# Patient Record
Sex: Female | Born: 1945
Health system: Southern US, Community
[De-identification: ages and names within clinical notes are randomized; demographics above are authoritative.]

## PROBLEM LIST (undated history)

## (undated) DIAGNOSIS — I471 Supraventricular tachycardia, unspecified: Secondary | ICD-10-CM

## (undated) DIAGNOSIS — I872 Venous insufficiency (chronic) (peripheral): Secondary | ICD-10-CM

## (undated) DIAGNOSIS — I890 Lymphedema, not elsewhere classified: Secondary | ICD-10-CM

## (undated) DIAGNOSIS — Z9889 Other specified postprocedural states: Secondary | ICD-10-CM

## (undated) DIAGNOSIS — K5733 Diverticulitis of large intestine without perforation or abscess with bleeding: Secondary | ICD-10-CM

## (undated) DIAGNOSIS — K219 Gastro-esophageal reflux disease without esophagitis: Secondary | ICD-10-CM

## (undated) DIAGNOSIS — Z8489 Family history of other specified conditions: Secondary | ICD-10-CM

## (undated) DIAGNOSIS — K529 Noninfective gastroenteritis and colitis, unspecified: Secondary | ICD-10-CM

## (undated) DIAGNOSIS — D649 Anemia, unspecified: Secondary | ICD-10-CM

## (undated) DIAGNOSIS — M1990 Unspecified osteoarthritis, unspecified site: Secondary | ICD-10-CM

## (undated) DIAGNOSIS — R011 Cardiac murmur, unspecified: Secondary | ICD-10-CM

## (undated) DIAGNOSIS — T8859XA Other complications of anesthesia, initial encounter: Secondary | ICD-10-CM

## (undated) DIAGNOSIS — I2510 Atherosclerotic heart disease of native coronary artery without angina pectoris: Secondary | ICD-10-CM

## (undated) DIAGNOSIS — R0789 Other chest pain: Secondary | ICD-10-CM

## (undated) DIAGNOSIS — R55 Syncope and collapse: Secondary | ICD-10-CM

## (undated) DIAGNOSIS — M419 Scoliosis, unspecified: Secondary | ICD-10-CM

## (undated) DIAGNOSIS — Z87898 Personal history of other specified conditions: Secondary | ICD-10-CM

## (undated) DIAGNOSIS — G43909 Migraine, unspecified, not intractable, without status migrainosus: Secondary | ICD-10-CM

## (undated) DIAGNOSIS — R001 Bradycardia, unspecified: Secondary | ICD-10-CM

## (undated) DIAGNOSIS — B029 Zoster without complications: Secondary | ICD-10-CM

## (undated) DIAGNOSIS — I700 Atherosclerosis of aorta: Secondary | ICD-10-CM

## (undated) DIAGNOSIS — R112 Nausea with vomiting, unspecified: Secondary | ICD-10-CM

## (undated) DIAGNOSIS — T4145XA Adverse effect of unspecified anesthetic, initial encounter: Secondary | ICD-10-CM

## (undated) DIAGNOSIS — N83209 Unspecified ovarian cyst, unspecified side: Secondary | ICD-10-CM

## (undated) HISTORY — DX: Cardiac murmur, unspecified: R01.1

## (undated) HISTORY — DX: Diverticulitis of large intestine without perforation or abscess with bleeding: K57.33

## (undated) HISTORY — DX: Migraine, unspecified, not intractable, without status migrainosus: G43.909

## (undated) HISTORY — PX: COLONOSCOPY: SHX174

## (undated) HISTORY — PX: TONSILLECTOMY: SUR1361

## (undated) HISTORY — DX: Scoliosis, unspecified: M41.9

## (undated) HISTORY — DX: Syncope and collapse: R55

## (undated) HISTORY — DX: Personal history of other specified conditions: Z87.898

## (undated) HISTORY — DX: Zoster without complications: B02.9

## (undated) HISTORY — DX: Other chest pain: R07.89

## (undated) HISTORY — PX: ESOPHAGOGASTRODUODENOSCOPY: SHX1529

---
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
## 1989-09-30 HISTORY — PX: ABDOMINAL HYSTERECTOMY: SHX81

## 2004-08-29 ENCOUNTER — Ambulatory Visit: Payer: Self-pay | Admitting: Obstetrics and Gynecology

## 2004-11-30 ENCOUNTER — Encounter: Payer: Self-pay | Admitting: Obstetrics and Gynecology

## 2004-12-29 ENCOUNTER — Encounter: Payer: Self-pay | Admitting: Obstetrics and Gynecology

## 2005-01-28 ENCOUNTER — Encounter: Payer: Self-pay | Admitting: Obstetrics and Gynecology

## 2005-02-28 ENCOUNTER — Encounter: Payer: Self-pay | Admitting: Obstetrics and Gynecology

## 2005-03-30 ENCOUNTER — Encounter: Payer: Self-pay | Admitting: Obstetrics and Gynecology

## 2007-02-16 ENCOUNTER — Ambulatory Visit: Payer: Self-pay | Admitting: Internal Medicine

## 2007-04-24 ENCOUNTER — Ambulatory Visit: Payer: Self-pay

## 2008-04-30 DIAGNOSIS — K5733 Diverticulitis of large intestine without perforation or abscess with bleeding: Secondary | ICD-10-CM

## 2008-04-30 HISTORY — DX: Diverticulitis of large intestine without perforation or abscess with bleeding: K57.33

## 2008-05-11 ENCOUNTER — Inpatient Hospital Stay: Payer: Self-pay | Admitting: Unknown Physician Specialty

## 2008-05-23 ENCOUNTER — Ambulatory Visit: Payer: Self-pay | Admitting: Unknown Physician Specialty

## 2008-06-21 ENCOUNTER — Ambulatory Visit: Payer: Self-pay | Admitting: Obstetrics and Gynecology

## 2008-07-13 ENCOUNTER — Ambulatory Visit: Payer: Self-pay

## 2008-09-30 DIAGNOSIS — R0789 Other chest pain: Secondary | ICD-10-CM

## 2008-09-30 HISTORY — DX: Other chest pain: R07.89

## 2008-12-05 ENCOUNTER — Ambulatory Visit: Payer: Self-pay | Admitting: Unknown Physician Specialty

## 2009-08-03 ENCOUNTER — Ambulatory Visit: Payer: Self-pay

## 2009-12-05 ENCOUNTER — Other Ambulatory Visit: Payer: Self-pay | Admitting: Obstetrics and Gynecology

## 2010-04-17 ENCOUNTER — Ambulatory Visit: Payer: Self-pay | Admitting: Internal Medicine

## 2010-08-21 ENCOUNTER — Ambulatory Visit: Payer: Self-pay

## 2010-12-19 ENCOUNTER — Ambulatory Visit: Payer: Self-pay | Admitting: Obstetrics and Gynecology

## 2010-12-24 ENCOUNTER — Ambulatory Visit: Payer: Self-pay | Admitting: Obstetrics and Gynecology

## 2011-08-29 ENCOUNTER — Ambulatory Visit: Payer: Self-pay | Admitting: Obstetrics and Gynecology

## 2011-08-29 LAB — HM MAMMOGRAPHY: HM Mammogram: NORMAL

## 2011-11-27 ENCOUNTER — Ambulatory Visit: Payer: Self-pay | Admitting: Internal Medicine

## 2011-12-04 ENCOUNTER — Ambulatory Visit: Payer: Self-pay | Admitting: Internal Medicine

## 2011-12-25 ENCOUNTER — Ambulatory Visit (INDEPENDENT_AMBULATORY_CARE_PROVIDER_SITE_OTHER): Payer: PRIVATE HEALTH INSURANCE | Admitting: Internal Medicine

## 2011-12-25 ENCOUNTER — Encounter: Payer: Self-pay | Admitting: Internal Medicine

## 2011-12-25 VITALS — BP 108/60 | HR 82 | Temp 97.9°F | Resp 14 | Ht 62.0 in | Wt 125.5 lb

## 2011-12-25 DIAGNOSIS — K5733 Diverticulitis of large intestine without perforation or abscess with bleeding: Secondary | ICD-10-CM

## 2011-12-25 DIAGNOSIS — R0789 Other chest pain: Secondary | ICD-10-CM | POA: Insufficient documentation

## 2011-12-25 DIAGNOSIS — N83209 Unspecified ovarian cyst, unspecified side: Secondary | ICD-10-CM

## 2011-12-25 DIAGNOSIS — Z87898 Personal history of other specified conditions: Secondary | ICD-10-CM

## 2011-12-25 DIAGNOSIS — G4700 Insomnia, unspecified: Secondary | ICD-10-CM | POA: Insufficient documentation

## 2011-12-25 DIAGNOSIS — M419 Scoliosis, unspecified: Secondary | ICD-10-CM

## 2011-12-25 DIAGNOSIS — M4120 Other idiopathic scoliosis, site unspecified: Secondary | ICD-10-CM

## 2011-12-25 MED ORDER — ZOLPIDEM TARTRATE 10 MG PO TABS: 10.0000 mg | ORAL_TABLET | Freq: Every evening | ORAL | Status: DC | PRN

## 2011-12-25 NOTE — Progress Notes (Signed)
Patient ID: Jean Brooks, female   DOB: 10/12/1945, 65 y.o.   MRN: [id number]  Patient Active Problem List  Diagnoses  . Chest pain, non-cardiac  . Insomnia, persistent  . Diverticulitis of colon with hemorrhage  . Migraine headache  . Scoliosis of thoracic spine  . Cyst, ovarian  . H/O syncope    Subjective:  CC:   Chief Complaint  Patient presents with  . New Patient    HPI:   Jean Brooks a 66 y.o. female who presents to establish primary care.  She is tansferring from [place].  Her last office visit with him was many years ago.  She has had regular GYN  Follow up with Sharon Seller, last seen Jan 2013, for stable left ovarian cyst and PAP smear. . Mammogram normal Nov 2012.  Last colonoscopy by Dr. Mechele Collin was in 2010.  Has occasional episodes of sharp LLQ pain  Without diarrhea or fevers, so difficult to say whether it is the diverticular disease or the ovarian cysts causing the symptoms. She haa a comprehensive, noninvasive cardiac workup two years ago for atypical chest pain and a presyncopal episode which revealed PAF on 24 Hr Holter Monitor but symptoms are infrequent and she has not had to use Toprol prn.    Past Medical History  Diagnosis Date  . Chest pain, non-cardiac 2010    normal stress  test   . Diverticulitis of colon with hemorrhage August 2009    Hospitalized [facility]  . Migraine headache   . Scoliosis of thoracic spine   . H/O syncope     Past Surgical History  Procedure Date  . Appendectomy 1963  . Abdominal hysterectomy 1991         The following portions of the patient's history were reviewed and updated as appropriate: Allergies, current medications, and problem list.    Review of Systems:   12 Pt  review of systems was negative except those addressed in the HPI,     History   Social History  . Marital Status: Married    Spouse Name: N/A    Number of Children: N/A  . Years of Education: N/A   Occupational  History  . Not on file.   Social History Main Topics  . Smoking status: Never Smoker   . Smokeless tobacco: Never Used  . Alcohol Use: No  . Drug Use: No  . Sexually Active: Not on file   Other Topics Concern  . Not on file   Social History Narrative  . No narrative on file    Objective:  BP 108/60  Pulse 82  Temp(Src) 97.9 F (36.6 C) (Oral)  Resp 14  Ht 5\' 2"  (1.575 m)  Wt 125 lb 8 oz (56.926 kg)  BMI 22.95 kg/m2  SpO2 98%  General appearance: alert, cooperative and appears stated age Ears: normal TM's and external ear canals both ears Throat: lips, mucosa, and tongue normal; teeth and gums normal Neck: no adenopathy, no carotid bruit, supple, symmetrical, trachea midline and thyroid not enlarged, symmetric, no tenderness/mass/nodules Back: symmetric, no curvature. ROM normal. No CVA tenderness. Lungs: clear to auscultation bilaterally Heart: regular rate and rhythm, S1, S2 normal, no murmur, click, rub or gallop Abdomen: soft, non-tender; bowel sounds normal; no masses,  no organomegaly Pulses: 2+ and symmetric Skin: Skin color, texture, turgor normal. No rashes or lesions Lymph nodes: Cervical, supraclavicular, and axillary nodes normal.  Assessment and Plan:  Cyst, ovarian Incidental finding on 2009 CT done during episode  of diverticulitis.  Managed with serial ultrasounds and CA 125 by Dr. Inge Rise have been stable.  last imaging and marker done March 2012  H/O syncope April 2010, after treatment of UTI with cipro.  Echocardiogram noting normal LV function mild mitral and tricuspid insufficiency. 24-hour Holter monitor showed rare PACs and PVCs with short runs of SVT consistent with atrial fibrillation this stress echo was normal. She was considered a low [place] score and was given for when necessary Toprol by cardiologist Dr. Gwen Pounds.  Insomnia, persistent Low-dose in. She is careful not use it on a daily basis.  Scoliosis of thoracic  spine Currently asymptomatic.  Diverticulitis of colon with hemorrhage She has had no recent layers and is careful to avoid foods with small seeds. She does take a fiber supplement to keep her stools soft. Her gastroenterologist is Dr. Mechele Collin and she is up-to-date on colonoscopy.    Updated Medication List Outpatient Encounter Prescriptions as of 12/25/2011  Medication Sig Dispense Refill  . Ascorbic Acid (VITAMIN C) 100 MG tablet Take 100 mg by mouth daily.      . Biotin 5000 MCG CAPS Take by mouth.      Marland Kitchen glucosamine-chondroitin 500-400 MG tablet Take 1 tablet by mouth 3 (three) times daily.      . Probiotic Product (ALIGN PO) Take by mouth.      . zolpidem (AMBIEN) 10 MG tablet Take 1 tablet (10 mg total) by mouth at bedtime as needed.  30 tablet  5  . DISCONTD: zolpidem (AMBIEN) 10 MG tablet Take 5 mg by mouth at bedtime as needed.         Orders Placed This Encounter  Procedures  . HM MAMMOGRAPHY  . HM DEXA SCAN  . HM PAP SMEAR  . HM COLONOSCOPY    No Follow-up on file.

## 2011-12-25 NOTE — Patient Instructions (Addendum)
  Consider trying these low carb bread alternatives for the amount of fiber they contain:   pita bread or flatbread (Joseph's makes a pita bread and a flat bread , available at [employer] and [employer]; Toufayah makes a low carb flatbread available at [employer] and [employer]) [employer] and [employer] both make low carb tortillas from whole wheat that are very high in fiber.   Consider amitiza for chronic constipation  Your cholesterol is fine!! Your HDL is very high and is very protective,  But your LDL is also fine at 134

## 2011-12-29 ENCOUNTER — Encounter: Payer: Self-pay | Admitting: Internal Medicine

## 2011-12-29 DIAGNOSIS — N83209 Unspecified ovarian cyst, unspecified side: Secondary | ICD-10-CM | POA: Insufficient documentation

## 2011-12-29 DIAGNOSIS — K5733 Diverticulitis of large intestine without perforation or abscess with bleeding: Secondary | ICD-10-CM | POA: Insufficient documentation

## 2011-12-29 DIAGNOSIS — G43909 Migraine, unspecified, not intractable, without status migrainosus: Secondary | ICD-10-CM | POA: Insufficient documentation

## 2011-12-29 DIAGNOSIS — M419 Scoliosis, unspecified: Secondary | ICD-10-CM | POA: Insufficient documentation

## 2011-12-29 DIAGNOSIS — R55 Syncope and collapse: Secondary | ICD-10-CM | POA: Insufficient documentation

## 2011-12-29 HISTORY — DX: Unspecified ovarian cyst, unspecified side: N83.209

## 2011-12-29 NOTE — Assessment & Plan Note (Signed)
Incidental finding on 2009 CT done during episode of diverticulitis.  Managed with serial ultrasounds and CA 125 by Dr. Inge Rise have been stable.  last imaging and marker done March 2012

## 2011-12-29 NOTE — Assessment & Plan Note (Signed)
Low-dose in. She is careful not use it on a daily basis.

## 2011-12-29 NOTE — Assessment & Plan Note (Signed)
She has had no recent layers and is careful to avoid foods with small seeds. She does take a fiber supplement to keep her stools soft. Her gastroenterologist is Dr. Mechele Collin and she is up-to-date on colonoscopy.

## 2011-12-29 NOTE — Assessment & Plan Note (Signed)
April 2010, after treatment of UTI with cipro.  Echocardiogram noting normal LV function mild mitral and tricuspid insufficiency. 24-hour Holter monitor showed rare PACs and PVCs with short runs of SVT consistent with atrial fibrillation this stress echo was normal. She was considered a low [place] score and was given for when necessary Toprol by cardiologist Dr. Gwen Pounds.

## 2011-12-29 NOTE — Assessment & Plan Note (Signed)
Currently asymptomatic 

## 2012-01-02 ENCOUNTER — Other Ambulatory Visit: Payer: Self-pay

## 2012-01-02 LAB — CBC WITH DIFFERENTIAL/PLATELET
Basophil #: 0 10*3/uL (ref 0.0–0.1)
Basophil %: 0.6 %
Eosinophil #: 0.1 10*3/uL (ref 0.0–0.7)
Eosinophil %: 2.3 %
HCT: 39.5 % (ref 35.0–47.0)
HGB: 13.3 g/dL (ref 12.0–16.0)
Lymphocyte #: 1.5 10*3/uL (ref 1.0–3.6)
Lymphocyte %: 26.9 %
MCH: 30.5 pg (ref 26.0–34.0)
MCHC: 33.7 g/dL (ref 32.0–36.0)
MCV: 91 fL (ref 80–100)
Monocyte #: 0.5 10*3/uL (ref 0.0–0.7)
Monocyte %: 9.1 %
Neutrophil #: 3.4 10*3/uL (ref 1.4–6.5)
Neutrophil %: 61.1 %
Platelet: 245 10*3/uL (ref 150–440)
RBC: 4.36 10*6/uL (ref 3.80–5.20)
RDW: 13.1 % (ref 11.5–14.5)
WBC: 5.6 10*3/uL (ref 3.6–11.0)

## 2012-01-02 LAB — COMPREHENSIVE METABOLIC PANEL
Albumin: 4.2 g/dL (ref 3.4–5.0)
Alkaline Phosphatase: 63 U/L (ref 50–136)
Anion Gap: 7 (ref 7–16)
BUN: 12 mg/dL (ref 7–18)
Bilirubin,Total: 0.5 mg/dL (ref 0.2–1.0)
Calcium, Total: 9.3 mg/dL (ref 8.5–10.1)
Chloride: 104 mmol/L (ref 98–107)
Co2: 28 mmol/L (ref 21–32)
Creatinine: 0.79 mg/dL (ref 0.60–1.30)
EGFR (African American): >60
EGFR (Non-African Amer.): >60
Glucose: 83 mg/dL (ref 65–99)
Osmolality: 276 (ref 275–301)
Potassium: 3.7 mmol/L (ref 3.5–5.1)
SGOT(AST): 28 U/L (ref 15–37)
SGPT (ALT): 35 U/L
Sodium: 139 mmol/L (ref 136–145)
Total Protein: 7.7 g/dL (ref 6.4–8.2)

## 2012-01-02 LAB — SEDIMENTATION RATE: Erythrocyte Sed Rate: 5 mm/hr (ref 0–30)

## 2012-06-15 ENCOUNTER — Ambulatory Visit: Payer: PRIVATE HEALTH INSURANCE | Admitting: Internal Medicine

## 2012-09-01 ENCOUNTER — Ambulatory Visit: Payer: Self-pay | Admitting: Internal Medicine

## 2012-09-02 ENCOUNTER — Ambulatory Visit: Payer: Self-pay | Admitting: Internal Medicine

## 2012-09-03 ENCOUNTER — Telehealth: Payer: Self-pay | Admitting: Internal Medicine

## 2012-09-03 DIAGNOSIS — R928 Other abnormal and inconclusive findings on diagnostic imaging of breast: Secondary | ICD-10-CM

## 2012-09-03 NOTE — Telephone Encounter (Signed)
Left message on patient vm to call back and schedule appt for breast exam

## 2012-09-03 NOTE — Telephone Encounter (Signed)
Additional views of the right breast were negative for suspicious nodules, but Dr. [place] felt there was a non suspicious density that should be reevaluated in 6 months .  I would like her to come in for a breast exam

## 2012-09-08 ENCOUNTER — Ambulatory Visit: Payer: PRIVATE HEALTH INSURANCE | Admitting: Internal Medicine

## 2012-09-10 ENCOUNTER — Telehealth: Payer: Self-pay | Admitting: Internal Medicine

## 2012-09-10 NOTE — Telephone Encounter (Signed)
Pt is needing refill on Ambien. She uses [facility]

## 2012-09-11 ENCOUNTER — Other Ambulatory Visit: Payer: Self-pay

## 2012-09-11 MED ORDER — ZOLPIDEM TARTRATE 10 MG PO TABS: 10.0000 mg | ORAL_TABLET | Freq: Every evening | ORAL | Status: DC | PRN

## 2012-09-11 NOTE — Telephone Encounter (Signed)
Called in Garvin 10 mg. To [facility]

## 2012-09-11 NOTE — Telephone Encounter (Signed)
Refill request for Ambien 10 mg. Ok to refill?

## 2012-09-14 ENCOUNTER — Encounter: Payer: Self-pay | Admitting: Internal Medicine

## 2012-09-22 ENCOUNTER — Encounter: Payer: Self-pay | Admitting: Internal Medicine

## 2012-10-06 ENCOUNTER — Encounter: Payer: Self-pay | Admitting: Internal Medicine

## 2012-10-06 ENCOUNTER — Ambulatory Visit (INDEPENDENT_AMBULATORY_CARE_PROVIDER_SITE_OTHER): Payer: 59 | Admitting: Internal Medicine

## 2012-10-06 VITALS — BP 112/72 | HR 68 | Temp 98.1°F | Resp 16 | Wt 125.8 lb

## 2012-10-06 DIAGNOSIS — R928 Other abnormal and inconclusive findings on diagnostic imaging of breast: Secondary | ICD-10-CM

## 2012-10-06 DIAGNOSIS — Z1331 Encounter for screening for depression: Secondary | ICD-10-CM

## 2012-10-06 DIAGNOSIS — N63 Unspecified lump in unspecified breast: Secondary | ICD-10-CM

## 2012-10-06 DIAGNOSIS — K5900 Constipation, unspecified: Secondary | ICD-10-CM | POA: Insufficient documentation

## 2012-10-06 NOTE — Assessment & Plan Note (Signed)
She has a palpable nodule in the right upper outer quadrant which was seen on diagnostic mammogram and read as nonmalignant .. I have referred her to [name] for second opinion

## 2012-10-06 NOTE — Progress Notes (Signed)
Patient ID: Jean Brooks, female   DOB: 05/12/1946, 67 y.o.   MRN: [id number]  Patient Active Problem List  Diagnosis  . Chest pain, non-cardiac  . Insomnia, persistent  . Diverticulitis of colon with hemorrhage  . Migraine headache  . Scoliosis of thoracic spine  . Cyst, ovarian  . H/O syncope  . Abnormal mammogram    Subjective:  CC:   Chief Complaint  Patient presents with  . Follow-up    Mammogram Results    HPI:   Jean Brooks a 67 y.o. female who presents for followup on abnormal right mammogram. Patient was sent for diagnostic mammogram after screening mammogram showed a nodularity in the right upper-outer quadrant. Diagnostic mammogram was read as nonmalignant appearing. Six-month followup is recommended. Her brother in today for breast exam. She has noted a nodule in the right upper outer quadrant. She has no prior history of breast cancer or breast biopsies.   Past Medical History  Diagnosis Date  . Chest pain, non-cardiac 2010    normal stress  test   . Diverticulitis of colon with hemorrhage August 2009    Hospitalized [facility]  . Migraine headache   . Scoliosis of thoracic spine   . H/O syncope     Past Surgical History  Procedure Date  . Appendectomy 1963  . Abdominal hysterectomy 1991         The following portions of the patient's history were reviewed and updated as appropriate: Allergies, current medications, and problem list.    Review of Systems:   Patient denies headache, fevers, malaise, unintentional weight loss, skin rash, eye pain, sinus congestion and sinus pain, sore throat, dysphagia,  hemoptysis , cough, dyspnea, wheezing, chest pain, palpitations, orthopnea, edema, abdominal pain, nausea, melena, diarrhea, constipation, flank pain, dysuria, hematuria, urinary  Frequency, nocturia, numbness, tingling, seizures,  Focal weakness, Loss of consciousness,  Tremor, insomnia, depression, anxiety, and suicidal ideation.      History    Social History  . Marital Status: Married    Spouse Name: N/A    Number of Children: N/A  . Years of Education: N/A   Occupational History  . Not on file.   Social History Main Topics  . Smoking status: Never Smoker   . Smokeless tobacco: Never Used  . Alcohol Use: No  . Drug Use: No  . Sexually Active: Not on file   Other Topics Concern  . Not on file   Social History Narrative  . No narrative on file    Objective:  BP 112/72  Pulse 68  Temp 98.1 F (36.7 C) (Oral)  Resp 16  Wt 125 lb 12 oz (57.04 kg)  SpO2 96%  General appearance: alert, cooperative and appears stated age Ears: normal TM's and external ear canals both ears Throat: lips, mucosa, and tongue normal; teeth and gums normal Neck: no adenopathy, no carotid bruit, supple, symmetrical, trachea midline and thyroid not enlarged, symmetric, no tenderness/mass/nodules Breast: 0.5 cm mobile nodule right upper quadrant right breast.  Lungs: clear to auscultation bilaterally Heart: regular rate and rhythm, S1, S2 normal, no murmur, click, rub or gallop Abdomen: soft, non-tender; bowel sounds normal; no masses,  no organomegaly Pulses: 2+ and symmetric Skin: Skin color, texture, turgor normal. No rashes or lesions Lymph nodes: Cervical, supraclavicular, and axillary nodes normal.  Assessment and Plan: Abnormal mammogram She has a palpable nodule in the right upper outer quadrant which was seen on diagnostic mammogram and read as nonmalignant .. I have referred her to  [name] for second opinion  Constipation Operative or diverticular disease recommended increasing fiber to 25 mg and diet. Several recommendations made did not include nuts.   Updated Medication List Outpatient Encounter Prescriptions as of 10/06/2012  Medication Sig Dispense Refill  . zolpidem (AMBIEN) 10 MG tablet Take 1 tablet (10 mg total) by mouth at bedtime as needed.  30 tablet  5  . [DISCONTINUED] Ascorbic Acid (VITAMIN C) 100 MG  tablet Take 100 mg by mouth daily.      . Multiple Vitamin (MULTIVITAMIN) tablet Take 1 tablet by mouth daily.      . [DISCONTINUED] Biotin 5000 MCG CAPS Take by mouth.      . [DISCONTINUED] glucosamine-chondroitin 500-400 MG tablet Take 1 tablet by mouth 3 (three) times daily.      . [DISCONTINUED] Probiotic Product (ALIGN PO) Take by mouth.

## 2012-10-06 NOTE — Assessment & Plan Note (Signed)
Operative or diverticular disease recommended increasing fiber to 25 mg and diet. Several recommendations made did not include nuts.

## 2012-10-06 NOTE — Patient Instructions (Addendum)
  The protein bars by [employer] (the snack size, under 200 cal) have anywehre from 6 to 10 g fiber depnding on the variety.  There are many varieties , available widely again or in bulk in limited varieties at BJs)  Other so called "protein  Or granola bars" tend to be loaded with carbohydrates.  Remember, in food advertising, the word "energy" is synonymous for " carbohydrate."   [employer] makes a low carb whole wheat tortilla  That is 210 cal,  Has 26 g of fiber and is available at [employer]Js, [employer].  [employer],  Etc       There is a low carb pasta by [employer] available at [employer] that is acceptable and tastes great and moves through you like a gentle laxative

## 2013-01-07 NOTE — Telephone Encounter (Signed)
Please close encounter

## 2013-01-21 ENCOUNTER — Encounter: Payer: 59 | Admitting: Internal Medicine

## 2013-02-16 ENCOUNTER — Telehealth: Payer: Self-pay | Admitting: Emergency Medicine

## 2013-02-16 DIAGNOSIS — N63 Unspecified lump in unspecified breast: Secondary | ICD-10-CM

## 2013-02-16 NOTE — Telephone Encounter (Signed)
Kim @ [facility] left me a VM stating the patient will be due for her diag uni R mammogram to f/u nodularity.

## 2013-02-16 NOTE — Telephone Encounter (Signed)
Referral is in process as requested 

## 2013-03-23 ENCOUNTER — Other Ambulatory Visit (HOSPITAL_COMMUNITY)
Admission: RE | Admit: 2013-03-23 | Discharge: 2013-03-23 | Disposition: A | Discharge status: Home / Self Care | Payer: 59 | Source: Ambulatory Visit | Attending: Internal Medicine | Admitting: Internal Medicine

## 2013-03-23 ENCOUNTER — Ambulatory Visit (INDEPENDENT_AMBULATORY_CARE_PROVIDER_SITE_OTHER): Payer: 59 | Admitting: Internal Medicine

## 2013-03-23 ENCOUNTER — Encounter: Payer: Self-pay | Admitting: Internal Medicine

## 2013-03-23 VITALS — BP 108/62 | HR 68 | Temp 98.1°F | Resp 14 | Ht 63.0 in | Wt 126.3 lb

## 2013-03-23 DIAGNOSIS — IMO0002 Reserved for concepts with insufficient information to code with codable children: Secondary | ICD-10-CM

## 2013-03-23 DIAGNOSIS — R928 Other abnormal and inconclusive findings on diagnostic imaging of breast: Secondary | ICD-10-CM

## 2013-03-23 DIAGNOSIS — Z0000 Encounter for general adult medical examination without abnormal findings: Secondary | ICD-10-CM

## 2013-03-23 DIAGNOSIS — Z1151 Encounter for screening for human papillomavirus (HPV): Secondary | ICD-10-CM | POA: Insufficient documentation

## 2013-03-23 DIAGNOSIS — Z124 Encounter for screening for malignant neoplasm of cervix: Secondary | ICD-10-CM

## 2013-03-23 DIAGNOSIS — Z01419 Encounter for gynecological examination (general) (routine) without abnormal findings: Secondary | ICD-10-CM | POA: Insufficient documentation

## 2013-03-23 DIAGNOSIS — N941 Unspecified dyspareunia: Secondary | ICD-10-CM | POA: Insufficient documentation

## 2013-03-23 DIAGNOSIS — N6310 Unspecified lump in the right breast, unspecified quadrant: Secondary | ICD-10-CM

## 2013-03-23 DIAGNOSIS — G4700 Insomnia, unspecified: Secondary | ICD-10-CM

## 2013-03-23 DIAGNOSIS — Z23 Encounter for immunization: Secondary | ICD-10-CM

## 2013-03-23 DIAGNOSIS — N63 Unspecified lump in unspecified breast: Secondary | ICD-10-CM

## 2013-03-23 MED ORDER — ZOSTER VACCINE LIVE 19400 UNT/0.65ML ~~LOC~~ SOLR: 0.6500 mL | Freq: Once | SUBCUTANEOUS | Status: DC

## 2013-03-23 MED ORDER — ZOLPIDEM TARTRATE 10 MG PO TABS: 10.0000 mg | ORAL_TABLET | Freq: Every evening | ORAL | Status: DC | PRN

## 2013-03-23 MED ORDER — ZOLPIDEM TARTRATE ER 6.25 MG PO TBCR: 6.2500 mg | EXTENDED_RELEASE_TABLET | Freq: Every evening | ORAL | Status: DC | PRN

## 2013-03-23 MED ORDER — TETANUS-DIPHTH-ACELL PERTUSSIS 5-2.5-18.5 LF-MCG/0.5 IM SUSP: 0.5000 mL | Freq: Once | INTRAMUSCULAR | Status: DC

## 2013-03-23 NOTE — Patient Instructions (Signed)
Your exam was normal today   I recommend trying Astroglide or [place] vaginal lurbricant to help with the dryness that is making intercourse so uncomfortable  Return at your leisure for fasting labs (choelsterol,  Thyroid, etc)   I recommend the TDaP  Vaccine and the singles vaccine this year

## 2013-03-24 DIAGNOSIS — Z0000 Encounter for general adult medical examination without abnormal findings: Secondary | ICD-10-CM | POA: Insufficient documentation

## 2013-03-24 NOTE — Assessment & Plan Note (Signed)
Breast exam was normal today.  [name] examined her after last visit.Marland Kitchen  No mass found  Either.  6 month follow up ordered on mammogram.

## 2013-03-24 NOTE — Assessment & Plan Note (Signed)
Reviewed causes and treatments.  OTC lubricants discussed.

## 2013-03-24 NOTE — Assessment & Plan Note (Signed)
Managed with Ambien with early morning wakening commonly reported. .  recommended daily exercise as adjunctive therapy to relieve e stress

## 2013-03-24 NOTE — Assessment & Plan Note (Signed)
Annual exam including breast , pelvic and PAP smear were done today. All screenings addressed.

## 2013-03-24 NOTE — Progress Notes (Signed)
Subjective:     Jean Brooks is a 67 y.o. female and is here for a comprehensive physical exam. The patient reports no problems.  History   Social History  . Marital Status: Married    Spouse Name: N/A    Number of Children: N/A  . Years of Education: N/A   Occupational History  . Not on file.   Social History Main Topics  . Smoking status: Never Smoker   . Smokeless tobacco: Never Used  . Alcohol Use: No  . Drug Use: No  . Sexually Active: Not on file   Other Topics Concern  . Not on file   Social History Narrative  . No narrative on file   Health Maintenance  Topic Date Due  . Zostavax  06/21/2007  . Pneumococcal Polysaccharide Vaccine Age 80 And Over  06/20/2012  . Influenza Vaccine  05/31/2013  . Mammogram  09/02/2014  . Colonoscopy  12/06/2018  . Tetanus/tdap  03/24/2023    The following portions of the patient's history were reviewed and updated as appropriate: allergies, current medications, past family history, past medical history, past social history, past surgical history and problem list.  Review of Systems A comprehensive review of systems was negative.   Objective:  BP 108/62  Pulse 68  Temp(Src) 98.1 F (36.7 C) (Oral)  Resp 14  Ht 5\' 3"  (1.6 m)  Wt 126 lb 5 oz (57.295 kg)  BMI 22.38 kg/m2  SpO2 98%  General Appearance:    Alert, cooperative, no distress, appears stated age  Head:    Normocephalic, without obvious abnormality, atraumatic  Eyes:    PERRL, conjunctiva/corneas clear, EOM's intact, fundi    benign, both eyes  Ears:    Normal TM's and external ear canals, both ears  Nose:   Nares normal, septum midline, mucosa normal, no drainage    or sinus tenderness  Throat:   Lips, mucosa, and tongue normal; teeth and gums normal  Neck:   Supple, symmetrical, trachea midline, no adenopathy;    thyroid:  no enlargement/tenderness/nodules; no carotid   bruit or JVD  Back:     Symmetric, no curvature, ROM normal, no CVA tenderness   Lungs:     Clear to auscultation bilaterally, respirations unlabored  Chest Wall:    No tenderness or deformity   Heart:    Regular rate and rhythm, S1 and S2 normal, no murmur, rub   or gallop  Breast Exam:    No tenderness, masses, or nipple abnormality  Abdomen:     Soft, non-tender, bowel sounds active all four quadrants,    no masses, no organomegaly  Genitalia:    Pelvic: cervix normal in appearance, external genitalia normal, no adnexal masses or tenderness, no cervical motion tenderness, rectovaginal septum normal, uterus surgically absent   Extremities:   Extremities normal, atraumatic, no cyanosis or edema  Pulses:   2+ and symmetric all extremities  Skin:   Skin color, texture, turgor normal, no rashes or lesions  Lymph nodes:   Cervical, supraclavicular, and axillary nodes normal  Neurologic:   CNII-XII intact, normal strength, sensation and reflexes    throughout     Assessment:    Routine general medical examination at a health care facility Annual exam including breast , pelvic and PAP smear were done today. All screenings addressed.   Dyspareunia, female Reviewed causes and treatments.  OTC lubricants discussed.   Insomnia, persistent Managed with Ambien with early morning wakening commonly reported. .  recommended daily exercise  as adjunctive therapy to relieve e stress  Abnormal mammogram Breast exam was normal today.  Jean Brooks examined her after last visit.Marland Kitchen  No mass found  Either.  6 month follow up ordered on mammogram.   Updated Medication List Outpatient Encounter Prescriptions as of 03/23/2013  Medication Sig Dispense Refill  . zolpidem (AMBIEN) 10 MG tablet Take 1 tablet (10 mg total) by mouth at bedtime as needed.  30 tablet  5  . [DISCONTINUED] zolpidem (AMBIEN) 10 MG tablet Take 1 tablet (10 mg total) by mouth at bedtime as needed.  30 tablet  5  . Multiple Vitamin (MULTIVITAMIN) tablet Take 1 tablet by mouth daily.      . TDaP (BOOSTRIX)  5-2.5-18.5 LF-MCG/0.5 injection Inject 0.5 mLs into the muscle once.  0.5 mL  0  . zoster vaccine live, PF, (ZOSTAVAX) 01027 UNT/0.65ML injection Inject 19,400 Units into the skin once.  1 each  0  . [DISCONTINUED] zolpidem (AMBIEN CR) 6.25 MG CR tablet Take 1 tablet (6.25 mg total) by mouth at bedtime as needed for sleep.  30 tablet  0  . [DISCONTINUED] zoster vaccine live, PF, (ZOSTAVAX) 25366 UNT/0.65ML injection Inject 19,400 Units into the skin once.  1 each  0   No facility-administered encounter medications on file as of 03/23/2013.

## 2013-03-25 ENCOUNTER — Other Ambulatory Visit: Payer: Self-pay | Admitting: Internal Medicine

## 2013-03-25 LAB — CBC WITH DIFFERENTIAL/PLATELET
Basophil %: 0.9 %
Eosinophil #: 0.2 10*3/uL (ref 0.0–0.7)
HCT: 39.2 % (ref 35.0–47.0)
HGB: 13.5 g/dL (ref 12.0–16.0)
Lymphocyte #: 1.3 10*3/uL (ref 1.0–3.6)
MCH: 30.3 pg (ref 26.0–34.0)
MCHC: 34.6 g/dL (ref 32.0–36.0)
Monocyte #: 0.6 x10 3/mm (ref 0.2–0.9)
Neutrophil %: 64 %
RBC: 4.48 10*6/uL (ref 3.80–5.20)
RDW: 12.6 % (ref 11.5–14.5)

## 2013-03-25 LAB — LIPID PANEL
Cholesterol: 184 mg/dL (ref 0–200)
VLDL Cholesterol, Calc: 11 mg/dL (ref 5–40)

## 2013-03-25 LAB — COMPREHENSIVE METABOLIC PANEL
Albumin: 3.8 g/dL (ref 3.4–5.0)
Bilirubin,Total: 0.5 mg/dL (ref 0.2–1.0)
Calcium, Total: 9.2 mg/dL (ref 8.5–10.1)
Chloride: 108 mmol/L — ABNORMAL HIGH (ref 98–107)
Creatinine: 0.8 mg/dL (ref 0.60–1.30)
EGFR (African American): >60
EGFR (Non-African Amer.): >60
Glucose: 91 mg/dL (ref 65–99)
Osmolality: 279 (ref 275–301)
Potassium: 4 mmol/L (ref 3.5–5.1)
SGOT(AST): 17 U/L (ref 15–37)
Total Protein: 7 g/dL (ref 6.4–8.2)

## 2013-03-29 ENCOUNTER — Encounter: Payer: Self-pay | Admitting: *Deleted

## 2013-03-30 ENCOUNTER — Telehealth: Payer: Self-pay | Admitting: Internal Medicine

## 2013-03-30 NOTE — Telephone Encounter (Signed)
All labs normal,. Including cholesterol

## 2013-03-31 NOTE — Telephone Encounter (Signed)
Left message for pt to return my call.

## 2013-03-31 NOTE — Telephone Encounter (Signed)
Called and advised patient of results

## 2013-04-01 ENCOUNTER — Telehealth: Payer: Self-pay | Admitting: *Deleted

## 2013-04-01 NOTE — Telephone Encounter (Signed)
Patient would like a copy of labs as soon as they are scanned to chart mailed to her,

## 2013-04-14 ENCOUNTER — Encounter: Payer: Self-pay | Admitting: General Practice

## 2013-04-14 ENCOUNTER — Ambulatory Visit: Payer: Self-pay | Admitting: Surgery

## 2013-04-21 ENCOUNTER — Encounter: Payer: Self-pay | Admitting: Internal Medicine

## 2013-04-27 ENCOUNTER — Ambulatory Visit: Payer: Self-pay | Admitting: Anesthesiology

## 2013-05-04 ENCOUNTER — Ambulatory Visit (INDEPENDENT_AMBULATORY_CARE_PROVIDER_SITE_OTHER): Payer: 59 | Admitting: Adult Health

## 2013-05-04 ENCOUNTER — Encounter: Payer: Self-pay | Admitting: Adult Health

## 2013-05-04 VITALS — BP 108/62 | HR 74 | Temp 98.3°F | Resp 12

## 2013-05-04 DIAGNOSIS — R300 Dysuria: Secondary | ICD-10-CM | POA: Insufficient documentation

## 2013-05-04 LAB — POCT URINALYSIS DIPSTICK
Bilirubin, UA: NEGATIVE
Ketones, UA: NEGATIVE
Protein, UA: NEGATIVE

## 2013-05-04 MED ORDER — CIPROFLOXACIN HCL 250 MG PO TABS: 250.0000 mg | ORAL_TABLET | Freq: Two times a day (BID) | ORAL | Status: DC

## 2013-05-04 MED ORDER — PHENAZOPYRIDINE HCL 200 MG PO TABS: 200.0000 mg | ORAL_TABLET | Freq: Three times a day (TID) | ORAL | Status: DC | PRN

## 2013-05-04 NOTE — Assessment & Plan Note (Signed)
Start Cipro bid x 3 days. Drink plenty of fluids. Pyridium for discomfort. RTC if no improvement in 3-4 days.

## 2013-05-04 NOTE — Progress Notes (Signed)
  Subjective:    Patient ID: Jean Brooks, female    DOB: 15-Feb-1946, 67 y.o.   MRN: [id number]  HPI  Patient is a 67 year old female who presents to clinic with urinary tract infection symptoms. She reports frequency, dysuria, chills and low grade fever. Symptoms began yesterday. She took AZO with relief of symptoms.   Current Outpatient Prescriptions on File Prior to Visit  Medication Sig Dispense Refill  . Multiple Vitamin (MULTIVITAMIN) tablet Take 1 tablet by mouth daily.      Marland Kitchen zolpidem (AMBIEN) 10 MG tablet Take 1 tablet (10 mg total) by mouth at bedtime as needed.  30 tablet  5   No current facility-administered medications on file prior to visit.    Review of Systems  Genitourinary: Positive for dysuria, urgency and frequency. Negative for hematuria.       No suprapubic tenderness.     BP 108/62  Pulse 74  Temp(Src) 98.3 F (36.8 C) (Oral)  Resp 12  SpO2 95%    Objective:   Physical Exam  Constitutional: She is oriented to person, place, and time. She appears well-developed and well-nourished. No distress.  HENT:  Head: Normocephalic and atraumatic.  Cardiovascular: Normal rate and regular rhythm.   Pulmonary/Chest: Effort normal. No respiratory distress.  Abdominal: Soft.  Neurological: She is alert and oriented to person, place, and time.  Skin: Skin is warm and dry.  Psychiatric: She has a normal mood and affect. Her behavior is normal. Judgment and thought content normal.       Assessment & Plan:

## 2013-05-04 NOTE — Patient Instructions (Addendum)
Urinary Tract Infection  Urinary tract infections (UTIs) can develop anywhere along your urinary tract. Your urinary tract is your body's drainage system for removing wastes and extra water. Your urinary tract includes two kidneys, two ureters, a bladder, and a urethra. Your kidneys are a pair of bean-shaped organs. Each kidney is about the size of your fist. They are located below your ribs, one on each side of your spine.  CAUSES  Infections are caused by microbes, which are microscopic organisms, including fungi, viruses, and bacteria. These organisms are so small that they can only be seen through a microscope. Bacteria are the microbes that most commonly cause UTIs.  SYMPTOMS   Symptoms of UTIs may vary by age and gender of the patient and by the location of the infection. Symptoms in young women typically include a frequent and intense urge to urinate and a painful, burning feeling in the bladder or urethra during urination. Older women and men are more likely to be tired, shaky, and weak and have muscle aches and abdominal pain. A fever may mean the infection is in your kidneys. Other symptoms of a kidney infection include pain in your back or sides below the ribs, nausea, and vomiting.  DIAGNOSIS  To diagnose a UTI, your caregiver will ask you about your symptoms. Your caregiver also will ask to provide a urine sample. The urine sample will be tested for bacteria and white blood cells. White blood cells are made by your body to help fight infection.  TREATMENT   Typically, UTIs can be treated with medication. Because most UTIs are caused by a bacterial infection, they usually can be treated with the use of antibiotics. The choice of antibiotic and length of treatment depend on your symptoms and the type of bacteria causing your infection.  HOME CARE INSTRUCTIONS   If you were prescribed antibiotics, take them exactly as your caregiver instructs you. Finish the medication even if you feel better after you  have only taken some of the medication.   Drink enough water and fluids to keep your urine clear or pale yellow.   Avoid caffeine, tea, and carbonated beverages. They tend to irritate your bladder.   Empty your bladder often. Avoid holding urine for long periods of time.   Empty your bladder before and after sexual intercourse.   After a bowel movement, women should cleanse from front to back. Use each tissue only once.  SEEK MEDICAL CARE IF:    You have back pain.   You develop a fever.   Your symptoms do not begin to resolve within 3 days.  SEEK IMMEDIATE MEDICAL CARE IF:    You have severe back pain or lower abdominal pain.   You develop chills.   You have nausea or vomiting.   You have continued burning or discomfort with urination.  MAKE SURE YOU:    Understand these instructions.   Will watch your condition.   Will get help right away if you are not doing well or get worse.  Document Released: 06/26/2005 Document Revised: 03/17/2012 Document Reviewed: 10/25/2011  Exit[employer] Patient Information 2014 [employer]

## 2013-05-04 NOTE — Addendum Note (Signed)
Addended by: Novella Olive on: 05/04/2013 04:01 PM   Modules accepted: Orders

## 2013-05-06 ENCOUNTER — Encounter: Payer: Self-pay | Admitting: Internal Medicine

## 2013-05-10 ENCOUNTER — Telehealth: Payer: Self-pay | Admitting: Internal Medicine

## 2013-05-10 MED ORDER — CEPHALEXIN 500 MG PO CAPS: 500.0000 mg | ORAL_CAPSULE | Freq: Two times a day (BID) | ORAL | Status: DC

## 2013-05-10 NOTE — Telephone Encounter (Signed)
Pt notified. Rx sent to pharmacy by escript  

## 2013-05-10 NOTE — Telephone Encounter (Signed)
Start Keflex 500 mg bid x 7 days. Take with food. Report any adverse effects immediately. Have benadryl on hand just in case.

## 2013-05-10 NOTE — Telephone Encounter (Signed)
States she would like something else called in for her UTI as she is not better.

## 2013-05-10 NOTE — Telephone Encounter (Signed)
Patient reported dysuria has not subsided and frequency continues, patient also stated she is taking AZO OTC  and is not helping with symptoms.

## 2013-05-14 ENCOUNTER — Telehealth: Payer: Self-pay | Admitting: Internal Medicine

## 2013-05-14 MED ORDER — FLUCONAZOLE 150 MG PO TABS: 150.0000 mg | ORAL_TABLET | Freq: Every day | ORAL | Status: DC

## 2013-05-14 NOTE — Telephone Encounter (Signed)
Patient notified

## 2013-05-14 NOTE — Telephone Encounter (Signed)
I have already set pharmacy to [facility].

## 2013-05-14 NOTE — Telephone Encounter (Signed)
Pt just got off antibiotics and has a yeast infection. She was wanting something called into [facility] if it is before 3:45 pm and if not please send to [address].

## 2013-05-14 NOTE — Telephone Encounter (Signed)
2 days course of fluconazole sent to [facility]

## 2013-05-31 LAB — HM PAP SMEAR: HM Pap smear: NORMAL

## 2013-06-02 ENCOUNTER — Ambulatory Visit: Payer: Self-pay | Admitting: Anesthesiology

## 2013-07-09 ENCOUNTER — Ambulatory Visit: Payer: Self-pay | Admitting: Anesthesiology

## 2013-08-18 ENCOUNTER — Ambulatory Visit: Payer: Self-pay | Admitting: Physician Assistant

## 2013-08-18 LAB — URINALYSIS, COMPLETE
Bilirubin,UR: NEGATIVE
Glucose,UR: NEGATIVE mg/dL (ref 0–75)
Protein: NEGATIVE
RBC,UR: <1 /HPF (ref 0–5)
WBC UR: 2 /HPF (ref 0–5)

## 2013-08-20 LAB — URINE CULTURE

## 2013-09-14 ENCOUNTER — Ambulatory Visit: Payer: 59 | Admitting: Internal Medicine

## 2013-09-30 LAB — HM MAMMOGRAPHY: HM Mammogram: NORMAL

## 2013-10-06 ENCOUNTER — Ambulatory Visit (INDEPENDENT_AMBULATORY_CARE_PROVIDER_SITE_OTHER): Payer: 59 | Admitting: Internal Medicine

## 2013-10-06 ENCOUNTER — Encounter: Payer: Self-pay | Admitting: Internal Medicine

## 2013-10-06 VITALS — BP 108/68 | HR 69 | Temp 97.9°F | Resp 14 | Wt 115.5 lb

## 2013-10-06 DIAGNOSIS — E559 Vitamin D deficiency, unspecified: Secondary | ICD-10-CM

## 2013-10-06 DIAGNOSIS — R928 Other abnormal and inconclusive findings on diagnostic imaging of breast: Secondary | ICD-10-CM

## 2013-10-06 DIAGNOSIS — G4700 Insomnia, unspecified: Secondary | ICD-10-CM

## 2013-10-06 DIAGNOSIS — Z1239 Encounter for other screening for malignant neoplasm of breast: Secondary | ICD-10-CM

## 2013-10-06 DIAGNOSIS — R634 Abnormal weight loss: Secondary | ICD-10-CM

## 2013-10-06 MED ORDER — DICLOFENAC SODIUM 75 MG PO TBEC: 75.0000 mg | DELAYED_RELEASE_TABLET | Freq: Two times a day (BID) | ORAL | Status: DC

## 2013-10-06 MED ORDER — ZOLPIDEM TARTRATE 10 MG PO TABS
5.0000 mg | ORAL_TABLET | Freq: Every evening | ORAL | Status: DC | PRN
Start: 2013-10-06 — End: 2014-05-31

## 2013-10-06 NOTE — Patient Instructions (Signed)
You have lost all the weight you should lose  Do NOT lose any more!!  Firm up what you have if you are not happy!  We're checking vitamin D  And thyroid function today

## 2013-10-06 NOTE — Progress Notes (Signed)
Pre-visit discussion using our clinic review tool. No additional management support is needed unless otherwise documented below in the visit note.  

## 2013-10-07 ENCOUNTER — Telehealth: Payer: Self-pay | Admitting: *Deleted

## 2013-10-07 LAB — COMPREHENSIVE METABOLIC PANEL
ALT: 19 U/L (ref 0–35)
AST: 21 U/L (ref 0–37)
Albumin: 4.3 g/dL (ref 3.5–5.2)
Alkaline Phosphatase: 55 U/L (ref 39–117)
BUN: 21 mg/dL (ref 6–23)
CALCIUM: 9.5 mg/dL (ref 8.4–10.5)
CO2: 28 mEq/L (ref 19–32)
Chloride: 106 mEq/L (ref 96–112)
Creatinine, Ser: 0.7 mg/dL (ref 0.4–1.2)
GFR: 91.93 mL/min (ref >60.00)
Glucose, Bld: 100 mg/dL — ABNORMAL HIGH (ref 70–99)
Potassium: 4 mEq/L (ref 3.5–5.1)
Sodium: 140 mEq/L (ref 135–145)
TOTAL PROTEIN: 7 g/dL (ref 6.0–8.3)
Total Bilirubin: 0.5 mg/dL (ref 0.3–1.2)

## 2013-10-07 LAB — TSH: TSH: 0.72 u[IU]/mL (ref 0.35–5.50)

## 2013-10-07 LAB — VITAMIN D 25 HYDROXY (VIT D DEFICIENCY, FRACTURES): VIT D 25 HYDROXY: 27 ng/mL — AB (ref 30–89)

## 2013-10-07 MED ORDER — DICLOFENAC SODIUM 75 MG PO TBEC: 75.0000 mg | DELAYED_RELEASE_TABLET | Freq: Two times a day (BID) | ORAL | Status: DC

## 2013-10-07 NOTE — Telephone Encounter (Signed)
Patient called and stated Voltaren was called to wrong pharmacy corrected in chart and sent to correct pharmacy.

## 2013-10-09 NOTE — Progress Notes (Signed)
Patient ID: Jean Brooks, female   DOB: 11-08-1945, 68 y.o.   MRN: [id number]  Patient Active Problem List   Diagnosis Date Noted  . Dysuria 05/04/2013  . Routine general medical examination at a health care facility 03/24/2013  . Dyspareunia, female 03/23/2013  . Constipation 10/06/2012  . Abnormal mammogram 09/03/2012  . Cyst, ovarian 12/29/2011  . Diverticulitis of colon with hemorrhage   . Migraine headache   . Scoliosis of thoracic spine   . H/O syncope   . Insomnia, persistent 12/25/2011  . Chest pain, non-cardiac     Subjective:  CC:   Chief Complaint  Patient presents with  . Follow-up    6 month    HPI:   Jean Brooks a 68 y.o. female who presents for follow up on chronic conditions including migraine headaches, persistent insomnia,  And constipation.   She feels fine,  She has lost weight inadvertently because she changed her husband's diet to a low glycemic index diet.  Headaches are less frequent and insomnia has improved.    Past Medical History  Diagnosis Date  . Chest pain, non-cardiac 2010    normal stress  test   . Diverticulitis of colon with hemorrhage August 2009    Hospitalized [facility]  . Migraine headache   . Scoliosis of thoracic spine   . H/O syncope     Past Surgical History  Procedure Laterality Date  . Appendectomy  1963  . Abdominal hysterectomy  1991       The following portions of the patient's history were reviewed and updated as appropriate: Allergies, current medications, and problem list.    Review of Systems:   12 Pt  review of systems was negative except those addressed in the HPI,     History   Social History  . Marital Status: Married    Spouse Name: N/A    Number of Children: N/A  . Years of Education: N/A   Occupational History  . Not on file.   Social History Main Topics  . Smoking status: Never Smoker   . Smokeless tobacco: Never Used  . Alcohol Use: No  . Drug Use: No  . Sexual Activity: Not on  file   Other Topics Concern  . Not on file   Social History Narrative  . No narrative on file    Objective:  Filed Vitals:   10/06/13 1553  BP: 108/68  Pulse: 69  Temp: 97.9 F (36.6 C)  Resp: 14     General appearance: alert, cooperative and appears stated age Ears: normal TM's and external ear canals both ears Throat: lips, mucosa, and tongue normal; teeth and gums normal Neck: no adenopathy, no carotid bruit, supple, symmetrical, trachea midline and thyroid not enlarged, symmetric, no tenderness/mass/nodules Back: symmetric, no curvature. ROM normal. No CVA tenderness. Lungs: clear to auscultation bilaterally Heart: regular rate and rhythm, S1, S2 normal, no murmur, click, rub or gallop Abdomen: soft, non-tender; bowel sounds normal; no masses,  no organomegaly Pulses: 2+ and symmetric Skin: Skin color, texture, turgor normal. No rashes or lesions Lymph nodes: Cervical, supraclavicular, and axillary nodes normal.  Assessment and Plan:  Abnormal mammogram Diagnostic mammogram of right breast in June was normal.  She is now due for follow up screening mammogram now.    Insomnia, persistent Managed with prn Ambien with early morning wakening commonly reported. .  recommended daily exercise as adjunctive therapy to relieve stress     Updated Medication List Outpatient Encounter Prescriptions as of  10/06/2013  Medication Sig  . Multiple Vitamin (MULTIVITAMIN) tablet Take 1 tablet by mouth daily.  Marland Kitchen zolpidem (AMBIEN) 10 MG tablet Take 0.5 tablets (5 mg total) by mouth at bedtime as needed.  . [DISCONTINUED] zolpidem (AMBIEN) 10 MG tablet Take 1 tablet (10 mg total) by mouth at bedtime as needed.  . [DISCONTINUED] zolpidem (AMBIEN) 10 MG tablet Take 0.5 mg by mouth at bedtime as needed.  . fluconazole (DIFLUCAN) 150 MG tablet Take 1 tablet (150 mg total) by mouth daily.  . [DISCONTINUED] cephALEXin (KEFLEX) 500 MG capsule Take 1 capsule (500 mg total) by mouth 2 (two)  times daily.  . [DISCONTINUED] ciprofloxacin (CIPRO) 250 MG tablet Take 1 tablet (250 mg total) by mouth 2 (two) times daily.  . [DISCONTINUED] diclofenac (VOLTAREN) 75 MG EC tablet Take 1 tablet (75 mg total) by mouth 2 (two) times daily.  . [DISCONTINUED] phenazopyridine (PYRIDIUM) 200 MG tablet Take 1 tablet (200 mg total) by mouth 3 (three) times daily as needed for pain.     Orders Placed This Encounter  Procedures  . MM Digital Screening  . Vit D  25 hydroxy (rtn osteoporosis monitoring)  . Comprehensive metabolic panel  . TSH    No Follow-up on file.

## 2013-10-09 NOTE — Assessment & Plan Note (Signed)
Managed with prn Ambien with early morning wakening commonly reported. .  recommended daily exercise as adjunctive therapy to relieve stress

## 2013-10-09 NOTE — Assessment & Plan Note (Signed)
Diagnostic mammogram of right breast in June was normal.  She is now due for follow up screening mammogram now.

## 2013-10-13 ENCOUNTER — Telehealth: Payer: Self-pay | Admitting: *Deleted

## 2013-10-13 NOTE — Telephone Encounter (Signed)
Patient calling for lab results from 10/06/13 I see in chart but no note attached please advise.

## 2013-10-13 NOTE — Telephone Encounter (Signed)
Your thyroid, liver and kidney function are normal.  You do not need any medication changes. Please take 2000 units of vit d3 daily during the winter months,  Reduce to 1000 units daily come A[pril   regards,   Dr. Derrel Nip

## 2013-10-18 ENCOUNTER — Encounter: Payer: Self-pay | Admitting: *Deleted

## 2013-10-18 NOTE — Telephone Encounter (Signed)
Letter mailed

## 2013-10-27 ENCOUNTER — Ambulatory Visit: Payer: Self-pay | Admitting: Internal Medicine

## 2013-11-23 ENCOUNTER — Encounter: Payer: Self-pay | Admitting: Internal Medicine

## 2013-12-22 ENCOUNTER — Ambulatory Visit: Payer: Self-pay | Admitting: Unknown Physician Specialty

## 2013-12-24 LAB — PATHOLOGY REPORT

## 2014-02-22 ENCOUNTER — Encounter: Payer: Self-pay | Admitting: Internal Medicine

## 2014-04-25 ENCOUNTER — Encounter: Payer: Self-pay | Admitting: *Deleted

## 2014-04-27 ENCOUNTER — Encounter: Payer: 59 | Admitting: Internal Medicine

## 2014-05-03 ENCOUNTER — Ambulatory Visit: Payer: 59 | Admitting: Cardiovascular Disease

## 2014-05-31 ENCOUNTER — Encounter: Payer: Self-pay | Admitting: Internal Medicine

## 2014-05-31 ENCOUNTER — Ambulatory Visit (INDEPENDENT_AMBULATORY_CARE_PROVIDER_SITE_OTHER): Payer: 59 | Admitting: Internal Medicine

## 2014-05-31 VITALS — BP 102/64 | HR 73 | Temp 98.5°F | Resp 16 | Ht 63.0 in | Wt 122.0 lb

## 2014-05-31 DIAGNOSIS — Z23 Encounter for immunization: Secondary | ICD-10-CM

## 2014-05-31 DIAGNOSIS — G4700 Insomnia, unspecified: Secondary | ICD-10-CM

## 2014-05-31 DIAGNOSIS — R928 Other abnormal and inconclusive findings on diagnostic imaging of breast: Secondary | ICD-10-CM

## 2014-05-31 DIAGNOSIS — R5381 Other malaise: Secondary | ICD-10-CM

## 2014-05-31 DIAGNOSIS — E559 Vitamin D deficiency, unspecified: Secondary | ICD-10-CM

## 2014-05-31 DIAGNOSIS — R5383 Other fatigue: Secondary | ICD-10-CM

## 2014-05-31 DIAGNOSIS — Z0000 Encounter for general adult medical examination without abnormal findings: Secondary | ICD-10-CM

## 2014-05-31 DIAGNOSIS — E785 Hyperlipidemia, unspecified: Secondary | ICD-10-CM

## 2014-05-31 DIAGNOSIS — R300 Dysuria: Secondary | ICD-10-CM

## 2014-05-31 LAB — POCT URINALYSIS DIPSTICK
Bilirubin, UA: NEGATIVE
Blood, UA: NEGATIVE
Glucose, UA: NEGATIVE
KETONES UA: NEGATIVE
LEUKOCYTES UA: NEGATIVE
NITRITE UA: NEGATIVE
PH UA: 7
PROTEIN UA: NEGATIVE
Spec Grav, UA: 1.015
UROBILINOGEN UA: 0.2

## 2014-05-31 MED ORDER — ZOLPIDEM TARTRATE ER 12.5 MG PO TBCR: 12.5000 mg | EXTENDED_RELEASE_TABLET | Freq: Every evening | ORAL | Status: DC | PRN

## 2014-05-31 NOTE — Assessment & Plan Note (Addendum)
She is only averaging 4 hours of sleep with generic ambien.  Reviewed principles of good sleep hygiene. there is no report of apneic spells by husband. Previous use of A. Trial of Ambien CR.

## 2014-05-31 NOTE — Progress Notes (Signed)
Patient ID: Jean Brooks, female   DOB: 1945/12/17, 68 y.o.   MRN: [id number]    Subjective:     Jean Brooks is a 68 y.o. female and is here for a comprehensive physical exam. The patient reports problems - with persistent insomnia.  History   Social History  . Marital Status: Married    Spouse Name: N/A    Number of Children: N/A  . Years of Education: N/A   Occupational History  . Not on file.   Social History Main Topics  . Smoking status: Never Smoker   . Smokeless tobacco: Never Used  . Alcohol Use: No  . Drug Use: No  . Sexual Activity: Not on file   Other Topics Concern  . Not on file   Social History Narrative  . No narrative on file   Health Maintenance  Topic Date Due  . Pneumococcal Polysaccharide Vaccine Age 74 And Over  06/20/2012  . Influenza Vaccine  04/30/2014  . Mammogram  10/28/2015  . Tetanus/tdap  03/24/2023  . Colonoscopy  12/23/2023  . Zostavax  Completed    The following portions of the patient's history were reviewed and updated as appropriate: allergies, current medications, past family history, past medical history, past social history, past surgical history and problem list.  Review of Systems A comprehensive review of systems was negative.   Objective:   BP 102/64  Pulse 73  Temp(Src) 98.5 F (36.9 C) (Oral)  Resp 16  Ht 5\' 3"  (1.6 m)  Wt 122 lb (55.339 kg)  BMI 21.62 kg/m2  SpO2 98%  General appearance: alert, cooperative and appears stated age Head: Normocephalic, without obvious abnormality, atraumatic Eyes: conjunctivae/corneas clear. PERRL, EOM's intact. Fundi benign. Ears: normal TM's and external ear canals both ears Nose: Nares normal. Septum midline. Mucosa normal. No drainage or sinus tenderness. Throat: lips, mucosa, and tongue normal; teeth and gums normal Neck: no adenopathy, no carotid bruit, no JVD, supple, symmetrical, trachea midline and thyroid not enlarged, symmetric, no  tenderness/mass/nodules Lungs: clear to auscultation bilaterally Breasts: normal appearance, dense parenchyma,  no masses or tenderness Heart: regular rate and rhythm, S1, S2 normal, no murmur, click, rub or gallop Abdomen: soft, non-tender; bowel sounds normal; no masses,  no organomegaly Extremities: extremities normal, atraumatic, no cyanosis or edema Pulses: 2+ and symmetric Skin: Skin color, texture, turgor normal. No rashes or lesions Neurologic: Alert and oriented X 3, normal strength and tone. Normal symmetric reflexes. Normal coordination and gait.   Assessment and Plan:   Insomnia, persistent She is only averaging 4 hours of sleep with generic ambien.  Reviewed principles of good sleep hygiene. there is no report of apneic spells by husband. Previous use of A. Trial of Ambien CR.  Routine general medical examination at a health care facility Annual wellness  exam was done as well as a comprehensive physical exam and management of acute and chronic conditions .  During the course of the visit the patient was educated and counseled about appropriate screening and preventive services including : fall prevention , diabetes screening, nutrition counseling, colorectal cancer screening, and recommended immunizations.  Printed recommendations for health maintenance screenings was given.   Abnormal mammogram Diagnostic mammogram of right breast in June 2014 .  Follow up mammogram in January 2015 was normal was normal.      Updated Medication List Outpatient Encounter Prescriptions as of 05/31/2014  Medication Sig  . Multiple Vitamin (MULTIVITAMIN) tablet Take 1 tablet by mouth daily.  . [DISCONTINUED]  zolpidem (AMBIEN) 10 MG tablet Take 0.5 tablets (5 mg total) by mouth at bedtime as needed.  . diclofenac (VOLTAREN) 75 MG EC tablet Take 1 tablet (75 mg total) by mouth 2 (two) times daily.  . fluconazole (DIFLUCAN) 150 MG tablet Take 1 tablet (150 mg total) by mouth daily.  Marland Kitchen zolpidem  (AMBIEN CR) 12.5 MG CR tablet Take 1 tablet (12.5 mg total) by mouth at bedtime as needed for sleep.

## 2014-05-31 NOTE — Patient Instructions (Addendum)
Turmeric 1000 mg daily  glucosamine chondroitin sulfate   Fish oil  All help arthritis   Your urine was completely normal.  If you want to try a medication for OAB, remember that they all cause constipation!!  You had your annual  wellness exam today.  You do not need any more PAP smears  Ever!!  We will schedule your 3 d mammogram in Jan/Feb   You received the Prevnar vaccine today.  If you develop a sore arm , use tylenol and ibuprofen for a few days    We will contact you with the bloodwork results

## 2014-06-01 LAB — COMPREHENSIVE METABOLIC PANEL
ALK PHOS: 63 U/L (ref 39–117)
ALT: 19 U/L (ref 0–35)
AST: 23 U/L (ref 0–37)
Albumin: 4.1 g/dL (ref 3.5–5.2)
BILIRUBIN TOTAL: 0.4 mg/dL (ref 0.2–1.2)
BUN: 23 mg/dL (ref 6–23)
CO2: 29 mEq/L (ref 19–32)
Calcium: 9.6 mg/dL (ref 8.4–10.5)
Chloride: 103 mEq/L (ref 96–112)
Creatinine, Ser: 0.8 mg/dL (ref 0.4–1.2)
GFR: 73.92 mL/min (ref >60.00)
GLUCOSE: 89 mg/dL (ref 70–99)
Potassium: 4.2 mEq/L (ref 3.5–5.1)
Sodium: 138 mEq/L (ref 135–145)
Total Protein: 7.2 g/dL (ref 6.0–8.3)

## 2014-06-01 LAB — CBC WITH DIFFERENTIAL/PLATELET
Basophils Absolute: 0.1 10*3/uL (ref 0.0–0.1)
Basophils Relative: 1.5 % (ref 0.0–3.0)
Eosinophils Absolute: 0.2 10*3/uL (ref 0.0–0.7)
Eosinophils Relative: 2.5 % (ref 0.0–5.0)
HEMATOCRIT: 39.7 % (ref 36.0–46.0)
HEMOGLOBIN: 13.3 g/dL (ref 12.0–15.0)
LYMPHS ABS: 2 10*3/uL (ref 0.7–4.0)
Lymphocytes Relative: 27.3 % (ref 12.0–46.0)
MCHC: 33.6 g/dL (ref 30.0–36.0)
MCV: 90.1 fl (ref 78.0–100.0)
MONOS PCT: 6.8 % (ref 3.0–12.0)
Monocytes Absolute: 0.5 10*3/uL (ref 0.1–1.0)
NEUTROS ABS: 4.5 10*3/uL (ref 1.4–7.7)
Neutrophils Relative %: 61.9 % (ref 43.0–77.0)
PLATELETS: 254 10*3/uL (ref 150.0–400.0)
RBC: 4.41 Mil/uL (ref 3.87–5.11)
RDW: 12.6 % (ref 11.5–15.5)
WBC: 7.2 10*3/uL (ref 4.0–10.5)

## 2014-06-01 LAB — LIPID PANEL
Cholesterol: 199 mg/dL (ref 0–200)
HDL: 54.9 mg/dL (ref >39.00)
LDL Cholesterol: 121 mg/dL — ABNORMAL HIGH (ref 0–99)
NONHDL: 144.1
Total CHOL/HDL Ratio: 4
Triglycerides: 117 mg/dL (ref 0.0–149.0)
VLDL: 23.4 mg/dL (ref 0.0–40.0)

## 2014-06-01 LAB — VITAMIN D 25 HYDROXY (VIT D DEFICIENCY, FRACTURES): VITD: 40.38 ng/mL (ref 30.00–100.00)

## 2014-06-01 LAB — URINALYSIS, ROUTINE W REFLEX MICROSCOPIC
Bilirubin Urine: NEGATIVE
Hgb urine dipstick: NEGATIVE
KETONES UR: NEGATIVE
Leukocytes, UA: NEGATIVE
NITRITE: NEGATIVE
PH: 7.5 (ref 5.0–8.0)
RBC / HPF: NONE SEEN (ref 0–?)
Specific Gravity, Urine: 1.01 (ref 1.000–1.030)
TOTAL PROTEIN, URINE-UPE24: NEGATIVE
URINE GLUCOSE: NEGATIVE
Urobilinogen, UA: 0.2 (ref 0.0–1.0)
WBC UA: NONE SEEN (ref 0–?)

## 2014-06-01 LAB — TSH: TSH: 1.39 u[IU]/mL (ref 0.35–4.50)

## 2014-06-02 LAB — URINE CULTURE
Colony Count: NO GROWTH
Organism ID, Bacteria: NO GROWTH

## 2014-06-02 NOTE — Assessment & Plan Note (Signed)
Annual  wellness  exam was done as well as a comprehensive physical exam and management of acute and chronic conditions .  During the course of the visit the patient was educated and counseled about appropriate screening and preventive services including : fall prevention , diabetes screening, nutrition counseling, colorectal cancer screening, and recommended immunizations.  Printed recommendations for health maintenance screenings was given.  

## 2014-06-02 NOTE — Assessment & Plan Note (Addendum)
Diagnostic mammogram of right breast in June 2014 .  Follow up mammogram in January 2015 was normal was normal.

## 2014-06-07 ENCOUNTER — Ambulatory Visit (INDEPENDENT_AMBULATORY_CARE_PROVIDER_SITE_OTHER): Payer: 59 | Admitting: Cardiovascular Disease

## 2014-06-07 ENCOUNTER — Encounter: Payer: Self-pay | Admitting: Cardiovascular Disease

## 2014-06-07 VITALS — BP 123/74 | HR 61 | Ht 62.0 in | Wt 122.0 lb

## 2014-06-07 DIAGNOSIS — Z87898 Personal history of other specified conditions: Secondary | ICD-10-CM

## 2014-06-07 DIAGNOSIS — I498 Other specified cardiac arrhythmias: Secondary | ICD-10-CM

## 2014-06-07 DIAGNOSIS — Z9189 Other specified personal risk factors, not elsewhere classified: Secondary | ICD-10-CM

## 2014-06-07 DIAGNOSIS — I471 Supraventricular tachycardia: Secondary | ICD-10-CM | POA: Insufficient documentation

## 2014-06-07 NOTE — Progress Notes (Signed)
HPI  This is a pleasant 68 year old female who is here to establish cardiovascular care. She has no significant chronic medical conditions. She had presyncopal episode in 2010 which was likely vasovagal in nature. She underwent cardiac evaluation at [facility] which was unremarkable. She had a stress echocardiogram which was normal. Holter monitor showed sinus rhythm with short runs of SVT. Atrial Fibrillation could not be excluded. Echocardiogram showed normal LV systolic function, mild mitral and tricuspid regurgitation. She reports no further episodes since then. She denies any chest pain, dyspnea or palpitations. She does not smoke or drink alcohol. Her mother did have atrial fibrillation and stroke.  Allergies  Allergen Reactions  . Sulfa Antibiotics Hives  . Azithromycin   . Ciprofloxacin   . Erythromycin   . Flagyl [Metronidazole]   . Nitrofurantoin Monohyd Macro   . Penicillins   . Ibuprofen Rash     Current Outpatient Prescriptions on File Prior to Visit  Medication Sig Dispense Refill  . Multiple Vitamin (MULTIVITAMIN) tablet Take 1 tablet by mouth daily.      Marland Kitchen zolpidem (AMBIEN CR) 12.5 MG CR tablet Take 1 tablet (12.5 mg total) by mouth at bedtime as needed for sleep.  30 tablet  2   No current facility-administered medications on file prior to visit.     Past Medical History  Diagnosis Date  . Chest pain, non-cardiac 2010    normal stress  test   . Diverticulitis of colon with hemorrhage August 2009    Hospitalized [facility]  . Migraine headache   . Scoliosis of thoracic spine   . H/O syncope   . Heart murmur   . Shingles      Past Surgical History  Procedure Laterality Date  . Appendectomy  1963  . Abdominal hysterectomy  1991     Family History  Problem Relation Age of Onset  . COPD Mother 78    respiratory failure/fibrosis, seizures  . Stroke Mother   . Hypertension Mother   . COPD Father 69  . Mental illness Father     Parkinson's Dementia  . Aneurysm  Father 35  . Heart disease Maternal Grandfather 65  . Stroke Son 50    hypertensive, left brain      History   Social History  . Marital Status: Married    Spouse Name: N/A    Number of Children: N/A  . Years of Education: N/A   Occupational History  . Not on file.   Social History Main Topics  . Smoking status: Never Smoker   . Smokeless tobacco: Never Used  . Alcohol Use: No  . Drug Use: No  . Sexual Activity: Not on file   Other Topics Concern  . Not on file   Social History Narrative  . No narrative on file     ROS A 10 point review of system was performed. It is negative other than that mentioned in the history of present illness.   PHYSICAL EXAM   BP 123/74  Pulse 61  Ht 5\' 2"  (1.575 m)  Wt 122 lb (55.339 kg)  BMI 22.31 kg/m2 Constitutional: She is oriented to person, place, and time. She appears well-developed and well-nourished. No distress.  HENT: No nasal discharge.  Head: Normocephalic and atraumatic.  Eyes: Pupils are equal and round. No discharge.  Neck: Normal range of motion. Neck supple. No JVD present. No thyromegaly present.  Cardiovascular: Normal rate, regular rhythm, normal heart sounds. Exam reveals no gallop and no friction rub.  No murmur heard.  Pulmonary/Chest: Effort normal and breath sounds normal. No stridor. No respiratory distress. She has no wheezes. She has no rales. She exhibits no tenderness.  Abdominal: Soft. Bowel sounds are normal. She exhibits no distension. There is no tenderness. There is no rebound and no guarding.  Musculoskeletal: Normal range of motion. She exhibits no edema and no tenderness.  Neurological: She is alert and oriented to person, place, and time. Coordination normal.  Skin: Skin is warm and dry. No rash noted. She is not diaphoretic. No erythema. No pallor.  Psychiatric: She has a normal mood and affect. Her behavior is normal. Judgment and thought content normal.     AYT:[id number]  Rhythm  WITHIN  NORMAL LIMITS   ASSESSMENT AND PLAN

## 2014-06-07 NOTE — Patient Instructions (Signed)
Your physician wants you to follow-up in: 1 year. You will receive a reminder letter in the mail two months in advance. If you don't receive a letter, please call our office to schedule the follow-up appointment.  

## 2014-06-07 NOTE — Assessment & Plan Note (Signed)
Previous Holter monitor in 2010 showed short runs of SVT. Atrial fibrillation could not be entirely excluded. However, the patient is completely asymptomatic. Continue monitoring symptoms in early EKG. She develops any documented atrial fibrillation, anticoagulation would be indicated.

## 2014-09-26 ENCOUNTER — Other Ambulatory Visit: Payer: Self-pay | Admitting: *Deleted

## 2014-09-26 DIAGNOSIS — G4700 Insomnia, unspecified: Secondary | ICD-10-CM

## 2014-09-26 NOTE — Telephone Encounter (Signed)
Last visit 05/31/14, ok refill?

## 2014-09-27 ENCOUNTER — Other Ambulatory Visit: Payer: Self-pay | Admitting: Internal Medicine

## 2014-09-27 MED ORDER — ZOLPIDEM TARTRATE ER 12.5 MG PO TBCR: 12.5000 mg | EXTENDED_RELEASE_TABLET | Freq: Every evening | ORAL | Status: DC | PRN

## 2014-09-27 NOTE — Telephone Encounter (Signed)
Ok to refill,  Authorized in epic 

## 2014-09-27 NOTE — Telephone Encounter (Signed)
Ok to fill 

## 2014-09-28 NOTE — Telephone Encounter (Signed)
Phoned to pharmacy 

## 2014-09-28 NOTE — Telephone Encounter (Signed)
Refill faxed as requested.

## 2014-11-07 ENCOUNTER — Ambulatory Visit: Payer: Self-pay | Admitting: Internal Medicine

## 2014-11-07 LAB — HM MAMMOGRAPHY: HM Mammogram: NEGATIVE

## 2014-12-30 ENCOUNTER — Other Ambulatory Visit: Payer: Self-pay | Admitting: Internal Medicine

## 2014-12-30 NOTE — Telephone Encounter (Signed)
Last OV 9.1.15, last refill 1.29.16.  Please advise refill

## 2015-01-03 NOTE — Telephone Encounter (Signed)
Ok to refill,  printed rx  

## 2015-01-03 NOTE — Telephone Encounter (Signed)
rx faxed

## 2015-01-20 NOTE — H&P (Signed)
PATIENT NAME:  Jean Brooks, REMMERS MR#:  [id number] DATE OF BIRTH:  January 19, 1946  DATE OF ADMISSION:  04/27/2013  CHIEF COMPLAINT: Severe low back pain with radiation down the posterior lateral leg and calf cramping.   HISTORY OF PRESENT ILLNESS: Jean Brooks is a pleasant 69 year old white female with a history of low back pain that has failed conservative therapy and stretching and strengthening exercises. The pain has been present for several months, but has exacerbated over the past week or so. She has been unable to get any significant relief with conservative modalities and presents today for evaluation and treatment. She is describing a pain that starts in the right posterior back with radiation into the posterior calf, right side, with cramping going down the back of the leg. She occasionally has some mild give-way weakness on the right side, but no problems with bowel or bladder function. Otherwise, has been stable.   REVIEW OF SYSTEMS AND MEDICATIONS: Noted per nursing assessment sheet.   PAST MEDICAL HISTORY: Significant for no medical problems. No troubles with her heart or lungs. Negative review of systems on CNS, GI, GU.   PAST SURGICAL HISTORY: Include partial hysterectomy, tubal ligation, appendectomy, tonsillectomy.   CURRENT MEDICATIONS: Include Ambien 5 mg q.h.s.    ALLERGIES: PENICILLIN, SULFA, MACROBID, AZITHROMYCIN, CIPRO AND GENTAMICIN.   PHYSICAL EXAMINATION:  GENERAL: Reveals a pleasant white female in no acute distress. She is able to ambulate.  EYES: Pupils are equally round and reactive to light. Extraocular muscles are intact.  HEART: Regular rate and rhythm.  LUNGS: Clear to auscultation.  MUSCULOSKELETAL AND NEUROLOGIC: Inspection of the low back reveals a trigger point in the right side approximately over the posterior superior iliac crest. She has pain on extension with right lateral rotation, less so with left lateral rotation. With the patient in the supine position, she  has a positive straight leg raise at approximately 45 degrees on the right side. She has good strength throughout both proximal and distal to the lower extremities with slight weakness on flexion at the right knee as compared to the left side. Strength appears to be well preserved. Sensation is intact.   ASSESSMENT:  1. L5-S1 radiculitis, right side.  2. Right facet arthropathy.  3. Myofascial low back pain, right side.   PLAN:  1. I have had a long discussion with Jean Brooks regarding her care, and I feel that she would be a good candidate for epidural steroid management secondary to failure to gain relief with conservative treatment. We have gone over the risks and benefits of the procedure with her in full detail. All questions are answered.  2. Will also proceed with a trigger point injection today, with return to clinic in approximately 3 weeks. I have encouraged her to continue with physical therapy exercises for stretching and strengthening.   PROCEDURE: The patient was taken to the fluoroscopy suite and placed in the prone position with no IV sedation. She chose to do this. Her back was prepped with Betadine x3, and using AP fluoroscopic guidance, I identified the area overlying the right L5-S1 interspace. Via an intralaminar approach, after Betadine prep and using strict aseptic technique, I advanced an 18-gauge Tuohy needle to a depth of approximately 7 cm. There was loss of resistance to saline, negative aspiration for heme or CSF, no paresthesia. I then injected 2 mL of Isovue 200, yielding good epidural spread approximately 2 levels cephalad, 1 level caudad. This was for an epidurogram, also revealing evidence of the L3, L4  and L5 nerve roots on the right side, preferential uptake on the right side as compared to the left, with no evidence of IV or subarachnoid uptake. I then injected 5 mL of normal saline mixed with 1 mL ropivacaine 0.2% and 40 mg of triamcinolone incrementally, without evidence  of IV or subarachnoid uptake. The needle was withdrawn. I then proceeded with a 25-gauge needle to the right trigger point, with injection of 8 mL of ropivacaine 0.2% and 3 mg of Decadron in a fanlike distribution, without evidence of IV symptoms. The patient was convalesced and discharged to home in stable condition for followup as mentioned.   ____________________________ Alvina Filbert Andree Elk, MD jga:OSi D: 04/28/2013 09:22:41 ET T: 04/28/2013 09:35:17 ET JOB#: [id number]  cc: Alvina Filbert. Andree Elk, MD, <Dictator> Alvina Filbert ADAMS MD ELECTRONICALLY SIGNED 04/30/2013 8:20

## 2015-03-31 ENCOUNTER — Telehealth: Payer: Self-pay | Admitting: *Deleted

## 2015-03-31 MED ORDER — FLUCONAZOLE 150 MG PO TABS: 150.0000 mg | ORAL_TABLET | Freq: Every day | ORAL | Status: DC

## 2015-03-31 NOTE — Telephone Encounter (Signed)
rx sent to [facility]

## 2015-03-31 NOTE — Telephone Encounter (Signed)
Pt aware Rx sent.  

## 2015-03-31 NOTE — Telephone Encounter (Signed)
Pt called states she has been taking Cipro for a UTI, however pt is requesting an Rx for Diflucan for yeast infection.  Please advise refill

## 2015-04-04 ENCOUNTER — Telehealth: Payer: Self-pay | Admitting: Cardiovascular Disease

## 2015-04-04 NOTE — Telephone Encounter (Signed)
Left message on machine for patient to contact the office.   

## 2015-04-04 NOTE — Telephone Encounter (Signed)
Pt c/o of Chest Pain: STAT if CP now or developed within 24 hours  1. Are you having CP right now? No   2. Are you experiencing any other symptoms (ex. SOB, nausea, vomiting, sweating)? Still hot flashes so hard to tell, someetimes has light nausea but not always with pain  3. How long have you been experiencing CP? Friday Under L Breast about Golf ball size area   4. Is your CP continuous or coming and going? Comes and goes   5. Have you taken Nitroglycerin? No    ?

## 2015-04-05 ENCOUNTER — Encounter: Payer: Self-pay | Admitting: Physician Assistant

## 2015-04-05 ENCOUNTER — Ambulatory Visit (INDEPENDENT_AMBULATORY_CARE_PROVIDER_SITE_OTHER): Payer: 59 | Admitting: Physician Assistant

## 2015-04-05 VITALS — BP 120/72 | HR 61 | Ht 62.0 in | Wt 118.5 lb

## 2015-04-05 DIAGNOSIS — R079 Chest pain, unspecified: Secondary | ICD-10-CM

## 2015-04-05 DIAGNOSIS — Z8679 Personal history of other diseases of the circulatory system: Secondary | ICD-10-CM

## 2015-04-05 DIAGNOSIS — Z87898 Personal history of other specified conditions: Secondary | ICD-10-CM

## 2015-04-05 DIAGNOSIS — R1012 Left upper quadrant pain: Secondary | ICD-10-CM | POA: Diagnosis not present

## 2015-04-05 DIAGNOSIS — Z9189 Other specified personal risk factors, not elsewhere classified: Secondary | ICD-10-CM | POA: Diagnosis not present

## 2015-04-05 NOTE — Progress Notes (Signed)
Cardiology Office Note:  Date of Encounter: 04/05/2015  ID: Jean Brooks, DOB 10-19-1945, MRN [id number]  PCP:  Crecencio Mc, MD Primary Cardiologist:  Dr. Fletcher Anon, MD  Chief Complaint  Patient presents with  . other    Ref by Dr. Derrel Nip to evaluate for chest pain. Pt. c/o chest pain with shortness of breath.  Meds reviewed by the patient verbally.     HPI:  69 year old female with history of presyncope in 2010 likely secondary to vasovagual in etiology who presents for evaluation of intermittent left sided chest pain since 03/31/2015.   She underwent cardiac evaluation at [facility] in 2010 at the time of her presyncopal episode which was unremarkable. She had a stress echocardiogram which was normal. Holter monitor showed sinus rhythm with short runs of SVT. Atrial fibrillation could not be excluded. Echocardiogram showed normal LV systolic function, mild mitral and tricuspid regurgitation. She reported no further episodes since then. At her office visit on 05/2014 to establish care with [facility] she denied any chest pain, dyspnea or palpitations.  She does not smoke or drink alcohol. Her mother did have atrial fibrillation and stroke. It was recommended that she continue to monitor her symptoms as she was completely asymptomatic at that time.   She called the office on 7/5 with complaints of intermittent left sided chest pain underneath her breast. Initial episode of pain began while she was walking through parking lot to her car and persisted for several minutes while she drove, eventually causing her to pull over in a church parking lot while the pain subsided. Some associated axillary sweating with this episode and nausea. Pain overall lasted approximately 5-10 minutes before self resolving. Pain was sharp in nature, just underneath the left breast tissue and without radiation. SInce that episode pain has come and gone intermittently, only at rest or while she is sleeping. Pain will last for  approximately 5 minutes before self resolving. No further associated axillary sweating or nausea. Pain is approximately the size of a "golf ball" she reports. No association with movement or po intake. She has taken Aleave x 1 with no real association that she could see in change in symptoms. Pain came back later that night during her sleep and woke her up. When she has the pain she is unable to take a deep breath, but when she is without pain she is able to breath without issue. No palpitations. No presyncope or syncope. Weight has been stable, without lower extremity edema, abdominal distension, or early satiety. No BRBPR, melena, or hematemesis. She recently had a UTI that was treated with Cipro that has fully resolved.      Past Medical History  Diagnosis Date  . Chest pain, non-cardiac 2010    a. stress echo 2010: nl treadmill EKG w/o evidence of ischemia or arrhythmia, good exercise tolerance for age, normal stress echo images w/o evidence of myocardial ischemia; b. baseline echo with EF 55% peak stress showed nl LV systolic function wtih EF 65% w/o evidence of HK  . Diverticulitis of colon with hemorrhage August 2009    Hospitalized [facility]  . Migraine headache   . Scoliosis of thoracic spine   . H/O syncope     a. echo 2010: EF 60%, LV nl size, RV nl size & fxn, atria nl size, aorta nl, no pericardial effusion, aortic valve nl, mitral valve nl w/ mild insufficiency, tricuspid valve nl w/ mild insufficiency, pulmonic valve nl  . Shingles   :  Past Surgical History  Procedure Laterality Date  . Appendectomy  1963  . Abdominal hysterectomy  1991  :  Social History:  The patient  reports that she has never smoked. She has never used smokeless tobacco. She reports that she does not drink alcohol or use illicit drugs.   Family History  Problem Relation Age of Onset  . COPD Mother 40    respiratory failure/fibrosis, seizures  . Stroke Mother   . Hypertension Mother   . COPD Father 63  .  Mental illness Father     Parkinson's Dementia  . Aneurysm Father 64  . Heart disease Maternal Grandfather 57  . Stroke Son 60    hypertensive, left brain      Allergies:  Allergies  Allergen Reactions  . Sulfa Antibiotics Hives  . Azithromycin   . Ciprofloxacin   . Erythromycin   . Flagyl [Metronidazole]   . Nitrofurantoin Monohyd Macro   . Penicillins   . Ibuprofen Rash     Home Medications:  Current Outpatient Prescriptions  Medication Sig Dispense Refill  . Multiple Vitamin (MULTIVITAMIN) tablet Take 1 tablet by mouth daily.    Marland Kitchen zolpidem (AMBIEN CR) 12.5 MG CR tablet Take 1 tablet (12.5 mg total) by mouth at bedtime as needed for sleep. 30 tablet 2   No current facility-administered medications for this visit.     Review of Systems:  Review of Systems  Constitutional: Positive for diaphoresis. Negative for fever, chills, weight loss and malaise/fatigue.       Bilateral axillary sweating x 1 during initial pain episode on 7/1  Eyes: Negative for blurred vision, discharge and redness.  Respiratory: Positive for shortness of breath. Negative for cough, hemoptysis, sputum production and wheezing.        Unable to take deep breath when has chest pain, otherwise when without pain able to breath without issue  Cardiovascular: Positive for chest pain. Negative for palpitations, orthopnea, claudication, leg swelling and PND.       Left sided chest pain, underneath left breast tissue, no radiation   Gastrointestinal: Negative for heartburn, nausea, vomiting, abdominal pain, diarrhea, constipation, blood in stool and melena.  Genitourinary: Negative for dysuria, urgency, frequency, hematuria and flank pain.       Prior UTI fully resolved s/p Cipro with ppx Diflucan   Musculoskeletal: Negative for myalgias, back pain and falls.       No CVA tenderness   Skin: Negative for rash.  Neurological: Negative for dizziness, tremors, sensory change, speech change, focal weakness,  loss of consciousness, weakness and headaches.  Endo/Heme/Allergies: Does not bruise/bleed easily.  Psychiatric/Behavioral: Negative for depression, hallucinations and substance abuse. The patient is not nervous/anxious.   All other systems reviewed and are negative.    Physical Exam:  Blood pressure 120/72, pulse 61, height 5\' 2"  (1.575 m), weight 118 lb 8 oz (53.751 kg). BMI: Body mass index is 21.67 kg/(m^2). General: Pleasant, NAD. Psych: Normal affect. Responds to questions with normal affect.  Neuro: Alert and oriented X 3. Moves all extremities spontaneously. HEENT: Normocephalic, atraumatic. EOM intact. Sclera anicteric.  Neck: Trachea midline. Supple without bruits or JVD. Lungs:  Respirations regular and unlabored. CTA bilaterally without wheezing, crackles, or rhonchi.  Heart: RRR, normal s3, s4. No murmurs, rubs, or gallops. Unable to reproduce pain with palpation.  Abdomen: Soft, non-tender, non-distended, BS + x 4. Negative for CVA TTP.  Extremities: No clubbing, cyanosis or edema. DP/PT/Radials 2+ and equal bilaterally.   Accessory Clinical Findings:  EKG: NSR, 61 bpm, no significant st/t changes   Recent Labs: 05/31/2014: ALT 19; BUN 23; Creatinine, Ser 0.8; Hemoglobin 13.3; Platelets 254.0; Potassium 4.2; Sodium 138; TSH 1.39  05/31/2014: Cholesterol 199; HDL 54.90; LDL Cholesterol 121*; Total CHOL/HDL Ratio 4; Triglycerides 117.0; VLDL 23.4  CrCl cannot be calculated (Patient has no serum creatinine result on file.).  Weights: Wt Readings from Last 3 Encounters:  04/05/15 118 lb 8 oz (53.751 kg)  06/07/14 122 lb (55.339 kg)  05/31/14 122 lb (55.339 kg)    Other studies Reviewed: Additional studies/ records that were reviewed today include: prior notes.  Assessment & Plan:  1. 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 and sits most of the day -She does have known scoliosis and at times has had issues with her lower back and sciatica, though never with chest pain -Schedule for treadmill Myoview to attempt to reproduce any symptoms and evaluate for high risk ischemia  -If nuclear stress test is abnormal would proceed with cardiac cath, if normal would proceed with echo and work up for outside etiologies including GI, pulmonary, and MSK -Of note, she reports at the end of our visit her mother had a "spot" along her left lower lobe of her lung at the time of her passing that was non-cancerous  -Patient has lived in [place] her entire life  -Check CBC, CMET, and lipase    2. History of SVT:  -Dates back to 2010  -Unable to rule out Afib at that time on Holter monitor  -No palpitations felt with the above intermittent episodes of chest pain -No documented episodes of SVT since her episode in 2010 -Continue to monitor  3. History of syncope:  -None since 2010 -Felt to be secondary to vagal episode -Unremarkable cardiac work up at that time -Continue to monitor  Dispo: -Follow up pending the above nuclear stress test   Current medicines are reviewed at length with the patient today.  The patient did not have any concerns regarding medicines.   Christell Faith, PA-C [address] [place], [place] [address] [phone] [facility] 04/05/2015, 2:12 PM

## 2015-04-05 NOTE — Telephone Encounter (Signed)
Patient came by office yesterday afternoon. Scheduled appt with Christell Faith, PA for 7/6

## 2015-04-05 NOTE — Patient Instructions (Addendum)
Medication Instructions:  Your physician recommends that you continue on your current medications as directed. Please refer to the Current Medication list given to you today.   Labwork: Your physician recommends that you have labs today: CBC, CMET, lipase   Testing/Procedures: Your physician has requested that you have a lexiscan myoview.  Jean Brooks  Your caregiver has ordered a Stress Test with nuclear imaging. The purpose of this test is to evaluate the blood supply to your heart muscle. This procedure is referred to as a "Non-Invasive Stress Test." This is because other than having an IV started in your vein, nothing is inserted or "invades" your body. Cardiac stress tests are done to find areas of poor blood flow to the heart by determining the extent of coronary artery disease (CAD). Some patients exercise on a treadmill, which naturally increases the blood flow to your heart, while others who are  unable to walk on a treadmill due to physical limitations have a pharmacologic/chemical stress agent called Lexiscan . This medicine will mimic walking on a treadmill by temporarily increasing your coronary blood flow.   Please note: these test may take anywhere between 2-4 hours to complete  PLEASE REPORT TO [facility] TO GO  Date of Procedure: Wednesday, July 13, 8:00am Arrival Time for Procedure: 7:45am      PLEASE NOTIFY THE OFFICE AT LEAST 24 HOURS IN ADVANCE IF YOU ARE UNABLE TO KEEP YOUR APPOINTMENT.  [phone] AND  PLEASE NOTIFY NUCLEAR MEDICINE AT [facility] AT LEAST 24 HOURS IN ADVANCE IF YOU ARE UNABLE TO KEEP YOUR APPOINTMENT. [phone]  How to prepare for your Myoview test:  1. Do not eat or drink after midnight 2. No caffeine for 24 hours prior to test 3. No smoking 24 hours prior to test. 4. Your medication may be taken with water.  If your doctor stopped a medication because of this test, do  not take that medication. 5. Ladies, please do not wear dresses.  Skirts or pants are appropriate. Please wear a short sleeve shirt. 6. No perfume, cologne or lotion. 7. Wear comfortable walking shoes. No heels!            Follow-Up: Your physician wants you to follow-up pending treadmill  myoview results.    Any Other Special Instructions Will Be Listed Below (If Applicable).

## 2015-04-06 LAB — COMPREHENSIVE METABOLIC PANEL
ALK PHOS: 66 IU/L (ref 39–117)
ALT: 15 IU/L (ref 0–32)
AST: 18 IU/L (ref 0–40)
Albumin/Globulin Ratio: 1.8 (ref 1.1–2.5)
Albumin: 4.4 g/dL (ref 3.6–4.8)
BILIRUBIN TOTAL: 0.5 mg/dL (ref 0.0–1.2)
BUN/Creatinine Ratio: 18 (ref 11–26)
BUN: 11 mg/dL (ref 8–27)
CO2: 23 mmol/L (ref 18–29)
CREATININE: 0.62 mg/dL (ref 0.57–1.00)
Calcium: 9.6 mg/dL (ref 8.7–10.3)
Chloride: 102 mmol/L (ref 97–108)
GFR calc Af Amer: 108 mL/min/{1.73_m2} (ref >59)
GFR calc non Af Amer: 94 mL/min/{1.73_m2} (ref >59)
GLUCOSE: 99 mg/dL (ref 65–99)
Globulin, Total: 2.5 g/dL (ref 1.5–4.5)
Potassium: 4.2 mmol/L (ref 3.5–5.2)
Sodium: 140 mmol/L (ref 134–144)
Total Protein: 6.9 g/dL (ref 6.0–8.5)

## 2015-04-06 LAB — CBC
HEMOGLOBIN: 13.5 g/dL (ref 11.1–15.9)
Hematocrit: 39.5 % (ref 34.0–46.6)
MCH: 30.5 pg (ref 26.6–33.0)
MCHC: 34.2 g/dL (ref 31.5–35.7)
MCV: 89 fL (ref 79–97)
Platelets: 201 10*3/uL (ref 150–379)
RBC: 4.43 x10E6/uL (ref 3.77–5.28)
RDW: 13.3 % (ref 12.3–15.4)
WBC: 6.3 10*3/uL (ref 3.4–10.8)

## 2015-04-06 LAB — LIPASE: LIPASE: 38 U/L (ref 0–59)

## 2015-04-10 ENCOUNTER — Telehealth (HOSPITAL_COMMUNITY): Payer: Self-pay | Admitting: *Deleted

## 2015-04-10 ENCOUNTER — Telehealth: Payer: Self-pay | Admitting: *Deleted

## 2015-04-10 NOTE — Telephone Encounter (Signed)
Confirmed pt cancelled myoview scheduled for Wed., July 13 Spoke with Pamala Hurry in scheduling who stated test was cancelled.

## 2015-04-10 NOTE — Telephone Encounter (Signed)
Pt called just to let us know she cancelled her test that was for tmrw morning, she says she's not having the pains anymore. And if she gets them again she said she will go straight to the hospital. Or give [place] a call.

## 2015-04-12 ENCOUNTER — Other Ambulatory Visit: Payer: 59

## 2015-05-03 ENCOUNTER — Telehealth: Payer: Self-pay | Admitting: Cardiovascular Disease

## 2015-05-03 NOTE — Telephone Encounter (Signed)
Patient saw [name] in July for CP.   Wants to know if she still needs to fu with Arida for annual in September.  Please call or let me know I can call her .  Thank you!

## 2015-05-03 NOTE — Telephone Encounter (Signed)
S/w pt regarding Sept appt. Advised pt to keep 9/8 appt with Dr. Fletcher Anon. Pt is agreeable and states she will be here at 3:45pm

## 2015-06-01 ENCOUNTER — Encounter: Payer: 59 | Admitting: Internal Medicine

## 2015-06-02 ENCOUNTER — Ambulatory Visit: Payer: 59 | Admitting: Cardiovascular Disease

## 2015-06-08 ENCOUNTER — Ambulatory Visit: Payer: 59 | Admitting: Cardiovascular Disease

## 2015-06-14 ENCOUNTER — Encounter: Payer: Self-pay | Admitting: Internal Medicine

## 2015-06-14 ENCOUNTER — Ambulatory Visit (INDEPENDENT_AMBULATORY_CARE_PROVIDER_SITE_OTHER): Payer: 59 | Admitting: Internal Medicine

## 2015-06-14 VITALS — BP 98/60 | HR 63 | Temp 97.7°F | Resp 12 | Ht 62.75 in | Wt 116.8 lb

## 2015-06-14 DIAGNOSIS — E785 Hyperlipidemia, unspecified: Secondary | ICD-10-CM

## 2015-06-14 DIAGNOSIS — G4700 Insomnia, unspecified: Secondary | ICD-10-CM | POA: Diagnosis not present

## 2015-06-14 DIAGNOSIS — Z1239 Encounter for other screening for malignant neoplasm of breast: Secondary | ICD-10-CM

## 2015-06-14 DIAGNOSIS — N8320 Unspecified ovarian cysts: Secondary | ICD-10-CM | POA: Diagnosis not present

## 2015-06-14 DIAGNOSIS — R140 Abdominal distension (gaseous): Secondary | ICD-10-CM | POA: Diagnosis not present

## 2015-06-14 DIAGNOSIS — R5383 Other fatigue: Secondary | ICD-10-CM

## 2015-06-14 DIAGNOSIS — Z23 Encounter for immunization: Secondary | ICD-10-CM

## 2015-06-14 DIAGNOSIS — N83201 Unspecified ovarian cyst, right side: Secondary | ICD-10-CM

## 2015-06-14 DIAGNOSIS — N83202 Unspecified ovarian cyst, left side: Secondary | ICD-10-CM

## 2015-06-14 DIAGNOSIS — M8580 Other specified disorders of bone density and structure, unspecified site: Secondary | ICD-10-CM

## 2015-06-14 DIAGNOSIS — Z0000 Encounter for general adult medical examination without abnormal findings: Secondary | ICD-10-CM | POA: Diagnosis not present

## 2015-06-14 DIAGNOSIS — Z113 Encounter for screening for infections with a predominantly sexual mode of transmission: Secondary | ICD-10-CM

## 2015-06-14 LAB — CBC WITH DIFFERENTIAL/PLATELET
Basophils Absolute: 0 10*3/uL (ref 0.0–0.1)
Basophils Relative: 0.6 % (ref 0.0–3.0)
EOS PCT: 2.3 % (ref 0.0–5.0)
Eosinophils Absolute: 0.1 10*3/uL (ref 0.0–0.7)
HEMATOCRIT: 40.4 % (ref 36.0–46.0)
HEMOGLOBIN: 13.5 g/dL (ref 12.0–15.0)
LYMPHS PCT: 24 % (ref 12.0–46.0)
Lymphs Abs: 1.4 10*3/uL (ref 0.7–4.0)
MCHC: 33.4 g/dL (ref 30.0–36.0)
MCV: 89 fl (ref 78.0–100.0)
MONO ABS: 0.5 10*3/uL (ref 0.1–1.0)
MONOS PCT: 8.4 % (ref 3.0–12.0)
Neutro Abs: 3.8 10*3/uL (ref 1.4–7.7)
Neutrophils Relative %: 64.7 % (ref 43.0–77.0)
Platelets: 235 10*3/uL (ref 150.0–400.0)
RBC: 4.54 Mil/uL (ref 3.87–5.11)
RDW: 12.9 % (ref 11.5–15.5)
WBC: 5.9 10*3/uL (ref 4.0–10.5)

## 2015-06-14 LAB — LIPID PANEL
Cholesterol: 199 mg/dL (ref 0–200)
HDL: 63.5 mg/dL (ref >39.00)
LDL CALC: 118 mg/dL — AB (ref 0–99)
NONHDL: 135.62
Total CHOL/HDL Ratio: 3
Triglycerides: 86 mg/dL (ref 0.0–149.0)
VLDL: 17.2 mg/dL (ref 0.0–40.0)

## 2015-06-14 LAB — COMPREHENSIVE METABOLIC PANEL
ALT: 12 U/L (ref 0–35)
AST: 15 U/L (ref 0–37)
Albumin: 4.2 g/dL (ref 3.5–5.2)
Alkaline Phosphatase: 60 U/L (ref 39–117)
BUN: 18 mg/dL (ref 6–23)
CHLORIDE: 104 meq/L (ref 96–112)
CO2: 30 meq/L (ref 19–32)
Calcium: 9.6 mg/dL (ref 8.4–10.5)
Creatinine, Ser: 0.74 mg/dL (ref 0.40–1.20)
GFR: 82.96 mL/min (ref >60.00)
GLUCOSE: 87 mg/dL (ref 70–99)
POTASSIUM: 4.3 meq/L (ref 3.5–5.1)
SODIUM: 139 meq/L (ref 135–145)
Total Bilirubin: 0.4 mg/dL (ref 0.2–1.2)
Total Protein: 7.1 g/dL (ref 6.0–8.3)

## 2015-06-14 LAB — TSH: TSH: 1.7 u[IU]/mL (ref 0.35–4.50)

## 2015-06-14 MED ORDER — TRAZODONE HCL 50 MG PO TABS: 25.0000 mg | ORAL_TABLET | Freq: Every evening | ORAL | Status: DC | PRN

## 2015-06-14 MED ORDER — ESTROGENS, CONJUGATED 0.625 MG/GM VA CREA: 1.0000 g | TOPICAL_CREAM | Freq: Every day | VAGINAL | Status: DC

## 2015-06-14 MED ORDER — ALPRAZOLAM 0.5 MG PO TABS: 0.5000 mg | ORAL_TABLET | Freq: Every evening | ORAL | Status: DC | PRN

## 2015-06-14 NOTE — Patient Instructions (Addendum)
Alprazolam and trazodone for insomnia  Wean off alprazolam after two weeks of both  pelvic ultrasound . DEXA and  3 d mammogram ordered   Health Maintenance Adopting a healthy lifestyle and getting preventive care can go a long way to promote health and wellness. Talk with your health care provider about what schedule of regular examinations is right for you. This is a good chance for you to check in with your provider about disease prevention and staying healthy. In between checkups, there are plenty of things you can do on your own. Experts have done a lot of research about which lifestyle changes and preventive measures are most likely to keep you healthy. Ask your health care provider for more information. WEIGHT AND DIET  Eat a healthy diet  Be sure to include plenty of vegetables, fruits, low-fat dairy products, and lean protein.  Do not eat a lot of foods high in solid fats, added sugars, or salt.  Get regular exercise. This is one of the most important things you can do for your health.  Most adults should exercise for at least 150 minutes each week. The exercise should increase your heart rate and make you sweat (moderate-intensity exercise).  Most adults should also do strengthening exercises at least twice a week. This is in addition to the moderate-intensity exercise.  Maintain a healthy weight  Body mass index (BMI) is a measurement that can be used to identify possible weight problems. It estimates body fat based on height and weight. Your health care provider can help determine your BMI and help you achieve or maintain a healthy weight.  For females 87 years of age and older:   A BMI below 18.5 is considered underweight.  A BMI of 18.5 to 24.9 is normal.  A BMI of 25 to 29.9 is considered overweight.  A BMI of 30 and above is considered obese.  Watch levels of cholesterol and blood lipids  You should start having your blood tested for lipids and cholesterol at 68  years of age, then have this test every 5 years.  You may need to have your cholesterol levels checked more often if:  Your lipid or cholesterol levels are high.  You are older than 68 years of age.  You are at high risk for heart disease.  CANCER SCREENING   Lung Cancer  Lung cancer screening is recommended for adults 30-16 years old who are at high risk for lung cancer because of a history of smoking.  A yearly low-dose CT scan of the lungs is recommended for people who:  Currently smoke.  Have quit within the past 15 years.  Have at least a 30-pack-year history of smoking. A pack year is smoking an average of one pack of cigarettes a day for 1 year.  Yearly screening should continue until it has been 15 years since you quit.  Yearly screening should stop if you develop a health problem that would prevent you from having lung cancer treatment.  Breast Cancer  Practice breast self-awareness. This means understanding how your breasts normally appear and feel.  It also means doing regular breast self-exams. Let your health care provider know about any changes, no matter how small.  If you are in your 20s or 30s, you should have a clinical breast exam (CBE) by a health care provider every 1-3 years as part of a regular health exam.  If you are 65 or older, have a CBE every year. Also consider having a breast  X-ray (mammogram) every year.  If you have a family history of breast cancer, talk to your health care provider about genetic screening.  If you are at high risk for breast cancer, talk to your health care provider about having an MRI and a mammogram every year.  Breast cancer gene (BRCA) assessment is recommended for women who have family members with BRCA-related cancers. BRCA-related cancers include:  Breast.  Ovarian.  Tubal.  Peritoneal cancers.  Results of the assessment will determine the need for genetic counseling and BRCA1 and BRCA2 testing. Cervical  Cancer Routine pelvic examinations to screen for cervical cancer are no longer recommended for nonpregnant women who are considered low risk for cancer of the pelvic organs (ovaries, uterus, and vagina) and who do not have symptoms. A pelvic examination may be necessary if you have symptoms including those associated with pelvic infections. Ask your health care provider if a screening pelvic exam is right for you.   The Pap test is the screening test for cervical cancer for women who are considered at risk.  If you had a hysterectomy for a problem that was not cancer or a condition that could lead to cancer, then you no longer need Pap tests.  If you are older than 65 years, and you have had normal Pap tests for the past 10 years, you no longer need to have Pap tests.  If you have had past treatment for cervical cancer or a condition that could lead to cancer, you need Pap tests and screening for cancer for at least 20 years after your treatment.  If you no longer get a Pap test, assess your risk factors if they change (such as having a new sexual partner). This can affect whether you should start being screened again.  Some women have medical problems that increase their chance of getting cervical cancer. If this is the case for you, your health care provider may recommend more frequent screening and Pap tests.  The human papillomavirus (HPV) test is another test that may be used for cervical cancer screening. The HPV test looks for the virus that can cause cell changes in the cervix. The cells collected during the Pap test can be tested for HPV.  The HPV test can be used to screen women 93 years of age and older. Getting tested for HPV can extend the interval between normal Pap tests from three to five years.  An HPV test also should be used to screen women of any age who have unclear Pap test results.  After 67 years of age, women should have HPV testing as often as Pap tests.  Colorectal  Cancer  This type of cancer can be detected and often prevented.  Routine colorectal cancer screening usually begins at 68 years of age and continues through 68 years of age.  Your health care provider may recommend screening at an earlier age if you have risk factors for colon cancer.  Your health care provider may also recommend using home test kits to check for hidden blood in the stool.  A small camera at the end of a tube can be used to examine your colon directly (sigmoidoscopy or colonoscopy). This is done to check for the earliest forms of colorectal cancer.  Routine screening usually begins at age 84.  Direct examination of the colon should be repeated every 5-10 years through 68 years of age. However, you may need to be screened more often if early forms of precancerous polyps or small  growths are found. Skin Cancer  Check your skin from head to toe regularly.  Tell your health care provider about any new moles or changes in moles, especially if there is a change in a mole's shape or color.  Also tell your health care provider if you have a mole that is larger than the size of a pencil eraser.  Always use sunscreen. Apply sunscreen liberally and repeatedly throughout the day.  Protect yourself by wearing long sleeves, pants, a wide-brimmed hat, and sunglasses whenever you are outside. HEART DISEASE, DIABETES, AND HIGH BLOOD PRESSURE   Have your blood pressure checked at least every 1-2 years. High blood pressure causes heart disease and increases the risk of stroke.  If you are between 74 years and 8 years old, ask your health care provider if you should take aspirin to prevent strokes.  Have regular diabetes screenings. This involves taking a blood sample to check your fasting blood sugar level.  If you are at a normal weight and have a low risk for diabetes, have this test once every three years after 68 years of age.  If you are overweight and have a high risk for  diabetes, consider being tested at a younger age or more often. PREVENTING INFECTION  Hepatitis B  If you have a higher risk for hepatitis B, you should be screened for this virus. You are considered at high risk for hepatitis B if:  You were born in a country where hepatitis B is common. Ask your health care provider which countries are considered high risk.  Your parents were born in a high-risk country, and you have not been immunized against hepatitis B (hepatitis B vaccine).  You have HIV or AIDS.  You use needles to inject street drugs.  You live with someone who has hepatitis B.  You have had sex with someone who has hepatitis B.  You get hemodialysis treatment.  You take certain medicines for conditions, including cancer, organ transplantation, and autoimmune conditions. Hepatitis C  Blood testing is recommended for:  Everyone born from [birth year] through 1964.  Anyone with known risk factors for hepatitis C. Sexually transmitted infections (STIs)  You should be screened for sexually transmitted infections (STIs) including gonorrhea and chlamydia if:  You are sexually active and are younger than 68 years of age.  You are older than 68 years of age and your health care provider tells you that you are at risk for this type of infection.  Your sexual activity has changed since you were last screened and you are at an increased risk for chlamydia or gonorrhea. Ask your health care provider if you are at risk.  If you do not have HIV, but are at risk, it may be recommended that you take a prescription medicine daily to prevent HIV infection. This is called pre-exposure prophylaxis (PrEP). You are considered at risk if:  You are sexually active and do not regularly use condoms or know the HIV status of your partner(s).  You take drugs by injection.  You are sexually active with a partner who has HIV. Talk with your health care provider about whether you are at high risk of  being infected with HIV. If you choose to begin PrEP, you should first be tested for HIV. You should then be tested every 3 months for as long as you are taking PrEP.  PREGNANCY   If you are premenopausal and you may become pregnant, ask your health care provider about preconception counseling.  If you may become pregnant, take 400 to 800 micrograms (mcg) of folic acid every day.  If you want to prevent pregnancy, talk to your health care provider about birth control (contraception). OSTEOPOROSIS AND MENOPAUSE   Osteoporosis is a disease in which the bones lose minerals and strength with aging. This can result in serious bone fractures. Your risk for osteoporosis can be identified using a bone density scan.  If you are 65 years of age or older, or if you are at risk for osteoporosis and fractures, ask your health care provider if you should be screened.  Ask your health care provider whether you should take a calcium or vitamin D supplement to lower your risk for osteoporosis.  Menopause may have certain physical symptoms and risks.  Hormone replacement therapy may reduce some of these symptoms and risks. Talk to your health care provider about whether hormone replacement therapy is right for you.  HOME CARE INSTRUCTIONS   Schedule regular health, dental, and eye exams.  Stay current with your immunizations.   Do not use any tobacco products including cigarettes, chewing tobacco, or electronic cigarettes.  If you are pregnant, do not drink alcohol.  If you are breastfeeding, limit how much and how often you drink alcohol.  Limit alcohol intake to no more than 1 drink per day for nonpregnant women. One drink equals 12 ounces of beer, 5 ounces of wine, or 1 ounces of hard liquor.  Do not use street drugs.  Do not share needles.  Ask your health care provider for help if you need support or information about quitting drugs.  Tell your health care provider if you often feel  depressed.  Tell your health care provider if you have ever been abused or do not feel safe at home. Document Released: 04/01/2011 Document Revised: 01/31/2014 Document Reviewed: 08/18/2013 Exit[employer] Patient Information 2015 [employer]. This information is not intended to replace advice given to you by your health care provider. Make sure you discuss any questions you have with your health care provider.  

## 2015-06-14 NOTE — Progress Notes (Signed)
Patient ID: Jean Brooks, female    DOB: 09-25-1946  Age: 69 y.o. MRN: [id number]  The patient is here for annual Medicare wellness examination and management of other chronic and acute problems.   Her multiple antibiotic allergies were reviewed.  She can tolerate cipro,  azithromycin and flagyl, none of which cause angioedema or rash ,  Flagyl which cause nausea  Was treated for UTI last Nov 2015 by Vira Agar with cipro  Colonoscopy 2015, next due 2020   Going to get a flu shot at work,    The risk factors are reflected in the social history.  The roster of all physicians providing medical care to patient - is listed in the Snapshot section of the chart.  Activities of daily living:  The patient is 100% independent in all ADLs: dressing, toileting, feeding as well as independent mobility  Home safety : The patient has smoke detectors in the home. They wear seatbelts.  There are no firearms at home. There is no violence in the home.   There is no risks for hepatitis, STDs or HIV. There is no   history of blood transfusion. They have no travel history to infectious disease endemic areas of the world.  The patient has seen their dentist in the last six month. They have seen their eye doctor in the last year. They admit to slight hearing difficulty with regard to whispered voices and some television programs.  They have deferred audiologic testing in the last year.  They do not  have excessive sun exposure. Discussed the need for sun protection: hats, long sleeves and use of sunscreen if there is significant sun exposure.   Diet: the importance of a healthy diet is discussed. They do have a healthy diet.  The benefits of regular aerobic exercise were discussed. She walks 5  times per week ,  30 minutes.   Depression screen: there are no signs or vegative symptoms of depression- irritability, change in appetite, anhedonia, sadness/tearfullness.  Cognitive assessment: the patient manages all  their financial and personal affairs and is actively engaged. They could relate day,date,year and events; recalled 2/3 objects at 3 minutes; performed clock-face test normally.  The following portions of the patient's history were reviewed and updated as appropriate: allergies, current medications, past family history, past medical history,  past surgical history, past social history  and problem list.  Visual acuity was not assessed per patient preference since she has regular follow up with her ophthalmologist. Hearing and body mass index were assessed and reviewed.   During the course of the visit the patient was educated and counseled about appropriate screening and preventive services including : fall prevention , diabetes screening, nutrition counseling, colorectal cancer screening, and recommended immunizations.    CC: The primary encounter diagnosis was Hyperlipidemia. Diagnoses of Other fatigue, Screen for STD (sexually transmitted disease), Screening for breast cancer, Bloating symptom, Cysts of both ovaries, Osteopenia, Need for prophylactic vaccination against Streptococcus pneumoniae (pneumococcus), Insomnia, persistent, and Encounter for preventive health examination were also pertinent to this visit.   1) Insomnia:  She has been taking ambien Cr for years and wants to get off of it  . Her insomnia is triggered by insomnia.  NO prior trial of alprazolam or trazodone.   2) abdominal bloating.  She denies constipation and weight gain. STILL HAS OVARIES,    [name] has a past medical history of Chest pain, non-cardiac (2010); Diverticulitis of colon with hemorrhage (August 2009); Migraine headache; Scoliosis of  thoracic spine; H/O syncope; and Shingles.   She has past surgical history that includes Appendectomy (1963) and Abdominal hysterectomy (1991).   Her family history includes Aneurysm (age of onset: 19) in her father; COPD (age of onset: 27) in her father; COPD (age of  onset: 99) in her mother; Heart attack (age of onset: 36) in her maternal grandfather; Hypertension in her mother; Mental illness in her father; Stroke in her maternal grandmother and mother; Stroke (age of onset: 47) in her son; Supraventricular tachycardia in her mother.She reports that she has never smoked. She has never used smokeless tobacco. She reports that she does not drink alcohol or use illicit drugs.  Outpatient Prescriptions Prior to Visit  Medication Sig Dispense Refill  . Multiple Vitamin (MULTIVITAMIN) tablet Take 1 tablet by mouth daily.    Marland Kitchen zolpidem (AMBIEN CR) 12.5 MG CR tablet Take 1 tablet (12.5 mg total) by mouth at bedtime as needed for sleep. 30 tablet 2   No facility-administered medications prior to visit.    Review of Systems   Patient denies headache, fevers, malaise, unintentional weight loss, skin rash, eye pain, sinus congestion and sinus pain, sore throat, dysphagia,  hemoptysis , cough, dyspnea, wheezing, chest pain, palpitations, orthopnea, edema, abdominal pain, nausea, melena, diarrhea, constipation, flank pain, dysuria, hematuria, urinary  Frequency, nocturia, numbness, tingling, seizures,  Focal weakness, Loss of consciousness,  Tremor, insomnia, depression, anxiety, and suicidal ideation.      Objective:  BP 98/60 mmHg  Pulse 63  Temp(Src) 97.7 F (36.5 C) (Oral)  Resp 12  Ht 5' 2.75" (1.594 m)  Wt 116 lb 12 oz (52.957 kg)  BMI 20.84 kg/m2  SpO2 99%  Physical Exam   General appearance: alert, cooperative and appears stated age Head: Normocephalic, without obvious abnormality, atraumatic Eyes: conjunctivae/corneas clear. PERRL, EOM's intact. Fundi benign. Ears: normal TM's and external ear canals both ears Nose: Nares normal. Septum midline. Mucosa normal. No drainage or sinus tenderness. Throat: lips, mucosa, and tongue normal; teeth and gums normal Neck: no adenopathy, no carotid bruit, no JVD, supple, symmetrical, trachea midline and  thyroid not enlarged, symmetric, no tenderness/mass/nodules Lungs: clear to auscultation bilaterally Breasts: normal appearance, no masses or tenderness Heart: regular rate and rhythm, S1, S2 normal, no murmur, click, rub or gallop Abdomen: soft, non-tender; bowel sounds normal; no masses,  no organomegaly Extremities: extremities normal, atraumatic, no cyanosis or edema Pulses: 2+ and symmetric Skin: Skin color, texture, turgor normal. No rashes or lesions Neurologic: Alert and oriented X 3, normal strength and tone. Normal symmetric reflexes. Normal coordination and gait.     Assessment & Plan:   Problem List Items Addressed This Visit      Unprioritized   Insomnia, persistent    Trial of alprazolam and trazodone.       Cyst, ovarian    Given her new onset bloating,  We discussed need to reassess ovaries and rule out CA.  CA 125 and ultrasound ordered.       Relevant Orders   CA 125 (Completed)   US Pelvis Complete   US Transvaginal Non-OB   Encounter for preventive health examination    Annual comprehensive preventive exam was done as well as an evaluation and management of chronic conditions .  During the course of the visit the patient was educated and counseled about appropriate screening and preventive services including :  diabetes screening, lipid analysis with projected  10 year  risk for CAD  Which is 4.7 % using the  [place] risk calculator for women, , nutrition counseling, colorectal cancer screening, and recommended immunizations.  Printed recommendations for health maintenance screenings was given.        Osteopenia    T scores were -1.4 in 2012 . Repeat DEXA due.       Relevant Orders   DG Bone Density    Other Visit Diagnoses    Hyperlipidemia    -  Primary    Relevant Orders    Lipid panel (Completed)    Other fatigue        Relevant Orders    CBC with Differential/Platelet (Completed)    Comprehensive metabolic panel (Completed)    TSH  (Completed)    Screen for STD (sexually transmitted disease)        Relevant Orders    Hepatitis C antibody (Completed)    HIV antibody (with reflex) (Completed)    Screening for breast cancer        Relevant Orders    MM DIGITAL SCREENING BILATERAL    Bloating symptom        Relevant Orders    CA 125 (Completed)    US Pelvis Complete    Need for prophylactic vaccination against Streptococcus pneumoniae (pneumococcus)        Relevant Orders    Pneumococcal polysaccharide vaccine 23-valent greater than or equal to 2yo subcutaneous/IM (Completed)       I have discontinued Ms. Hornbaker's zolpidem. I am also having her start on conjugated estrogens, ALPRAZolam, and traZODone. Additionally, I am having her maintain her multivitamin and Vitamin D3.  Meds ordered this encounter  Medications  . Cholecalciferol (VITAMIN D3) 1000 UNITS CAPS    Sig: Take 1 capsule by mouth daily.  Marland Kitchen conjugated estrogens (PREMARIN) vaginal cream    Sig: Place 0.5 Applicatorfuls vaginally daily. For two weeks,  Then twice weekly thereafter    Dispense:  42.5 g    Refill:  12  . ALPRAZolam (XANAX) 0.5 MG tablet    Sig: Take 1 tablet (0.5 mg total) by mouth at bedtime as needed for anxiety.    Dispense:  30 tablet    Refill:  0  . traZODone (DESYREL) 50 MG tablet    Sig: Take 0.5-1 tablets (25-50 mg total) by mouth at bedtime as needed for sleep.    Dispense:  30 tablet    Refill:  3    Medications Discontinued During This Encounter  Medication Reason  . zolpidem (AMBIEN CR) 12.5 MG CR tablet     Follow-up: No Follow-up on file.   Crecencio Mc, MD

## 2015-06-14 NOTE — Progress Notes (Signed)
Pre-visit discussion using our clinic review tool. No additional management support is needed unless otherwise documented below in the visit note.  

## 2015-06-15 ENCOUNTER — Ambulatory Visit (INDEPENDENT_AMBULATORY_CARE_PROVIDER_SITE_OTHER): Payer: 59 | Admitting: Family Medicine

## 2015-06-15 ENCOUNTER — Telehealth: Payer: Self-pay | Admitting: Internal Medicine

## 2015-06-15 ENCOUNTER — Encounter: Payer: Self-pay | Admitting: Family Medicine

## 2015-06-15 VITALS — BP 120/76 | HR 74 | Temp 98.0°F | Ht 62.0 in | Wt 118.5 lb

## 2015-06-15 DIAGNOSIS — T8090XA Unspecified complication following infusion and therapeutic injection, initial encounter: Secondary | ICD-10-CM

## 2015-06-15 LAB — CA 125: CA 125: 8 U/mL (ref <35)

## 2015-06-15 LAB — HEPATITIS C ANTIBODY: HCV Ab: NEGATIVE

## 2015-06-15 LAB — HIV ANTIBODY (ROUTINE TESTING W REFLEX): HIV 1&2 Ab, 4th Generation: NONREACTIVE

## 2015-06-15 MED ORDER — METHYLPREDNISOLONE 4 MG PO TBPK: ORAL_TABLET | ORAL | Status: DC

## 2015-06-15 MED ORDER — DOXYCYCLINE HYCLATE 100 MG PO TABS: 100.0000 mg | ORAL_TABLET | Freq: Two times a day (BID) | ORAL | Status: DC

## 2015-06-15 NOTE — Telephone Encounter (Signed)
Pt states she had a Pneumonia shot on 06/14/2015 and now pt is saying it's red and swollen. Pt has been taking Tylenol. Pt lunch is from 11-12. Thank You!

## 2015-06-15 NOTE — Progress Notes (Signed)
Pre visit review using our clinic review tool, if applicable. No additional management support is needed unless otherwise documented below in the visit note. 

## 2015-06-15 NOTE — Assessment & Plan Note (Signed)
Given her new onset bloating,  We discussed need to reassess ovaries and rule out CA.  CA 125 and ultrasound ordered.

## 2015-06-15 NOTE — Telephone Encounter (Signed)
Spoke with pt, scheduled today with Dr Lacinda Axon at [facility]

## 2015-06-15 NOTE — Patient Instructions (Signed)
Continue using benadryl as needed (particularly at night).  Use an antihistamine during the day (claritin, zyrtec).  If the swelling and redness does not improve, start the prednisone.  If the redness worsens or you develop fever, chills start the antibiotic.  Take care  Dr. Lacinda Axon

## 2015-06-15 NOTE — Assessment & Plan Note (Signed)
T scores were -1.4 in 2012 . Repeat DEXA due.

## 2015-06-15 NOTE — Assessment & Plan Note (Signed)
Annual comprehensive preventive exam was done as well as an evaluation and management of chronic conditions .  During the course of the visit the patient was educated and counseled about appropriate screening and preventive services including :  diabetes screening, lipid analysis with projected  10 year  risk for CAD  Which is 4.7 % using the [place] risk calculator for women, , nutrition counseling, colorectal cancer screening, and recommended immunizations.  Printed recommendations for health maintenance screenings was given.  

## 2015-06-15 NOTE — Telephone Encounter (Signed)
Left message on VM and called pts work number.  Unable to reach.

## 2015-06-15 NOTE — Assessment & Plan Note (Signed)
Trial of alprazolam and trazodone.

## 2015-06-16 ENCOUNTER — Telehealth: Payer: Self-pay

## 2015-06-16 ENCOUNTER — Ambulatory Visit (INDEPENDENT_AMBULATORY_CARE_PROVIDER_SITE_OTHER): Payer: 59 | Admitting: Cardiovascular Disease

## 2015-06-16 ENCOUNTER — Ambulatory Visit: Payer: 59

## 2015-06-16 ENCOUNTER — Telehealth: Payer: Self-pay | Admitting: Internal Medicine

## 2015-06-16 ENCOUNTER — Encounter: Payer: Self-pay | Admitting: Cardiovascular Disease

## 2015-06-16 VITALS — BP 118/58 | HR 66 | Ht 62.0 in | Wt 119.5 lb

## 2015-06-16 DIAGNOSIS — R0789 Other chest pain: Secondary | ICD-10-CM

## 2015-06-16 DIAGNOSIS — I471 Supraventricular tachycardia: Secondary | ICD-10-CM | POA: Diagnosis not present

## 2015-06-16 DIAGNOSIS — T8090XA Unspecified complication following infusion and therapeutic injection, initial encounter: Secondary | ICD-10-CM | POA: Insufficient documentation

## 2015-06-16 NOTE — Assessment & Plan Note (Signed)
She had no recurrent episodes of chest pain and thus she did not proceed with stress testing.

## 2015-06-16 NOTE — Progress Notes (Signed)
HPI  This is a pleasant 69 year old female who is here for a follow-up visit regarding possible paroxysmal supraventricular tachycardia. She had presyncopal episode in 2010 which was likely vasovagal in nature. She underwent cardiac evaluation at [facility] which was unremarkable. She had a stress echocardiogram which was normal. Holter monitor showed sinus rhythm with short runs of SVT. Atrial Fibrillation could not be excluded. Echocardiogram showed normal LV systolic function, mild mitral and tricuspid regurgitation.  She was seen in July by Christell Faith for atypical chest pain. A stress test was ordered. However the patient reports resolution of symptoms and she did not proceed with stress testing. She describes no further episodes since then. She denies any palpitations. She had a reaction to the pneumonia vaccine recently and currently is getting Medrol Dosepak .   Allergies  Allergen Reactions  . Sulfa Antibiotics Hives  . Azithromycin   . Ciprofloxacin   . Erythromycin   . Flagyl [Metronidazole]   . Nitrofurantoin Monohyd Macro   . Penicillins   . Ibuprofen Rash     Current Outpatient Prescriptions on File Prior to Visit  Medication Sig Dispense Refill  . Cholecalciferol (VITAMIN D3) 1000 UNITS CAPS Take 1 capsule by mouth daily.    . methylPREDNISolone (MEDROL DOSEPAK) 4 MG TBPK tablet As directed. 21 tablet 0  . Multiple Vitamin (MULTIVITAMIN) tablet Take 1 tablet by mouth daily.    Marland Kitchen ALPRAZolam (XANAX) 0.5 MG tablet Take 1 tablet (0.5 mg total) by mouth at bedtime as needed for anxiety. (Patient not taking: Reported on 06/16/2015) 30 tablet 0  . conjugated estrogens (PREMARIN) vaginal cream Place 0.5 Applicatorfuls vaginally daily. For two weeks,  Then twice weekly thereafter (Patient not taking: Reported on 06/16/2015) 42.5 g 12  . doxycycline (VIBRA-TABS) 100 MG tablet Take 1 tablet (100 mg total) by mouth 2 (two) times daily. (Patient not taking: Reported on 06/16/2015) 20 tablet 0  .  traZODone (DESYREL) 50 MG tablet Take 0.5-1 tablets (25-50 mg total) by mouth at bedtime as needed for sleep. (Patient not taking: Reported on 06/16/2015) 30 tablet 3   No current facility-administered medications on file prior to visit.     Past Medical History  Diagnosis Date  . Chest pain, non-cardiac 2010    a. stress echo 2010: nl treadmill EKG w/o evidence of ischemia or arrhythmia, good exercise tolerance for age, normal stress echo images w/o evidence of myocardial ischemia; b. baseline echo with EF 55% peak stress showed nl LV systolic function wtih EF 65% w/o evidence of HK  . Diverticulitis of colon with hemorrhage August 2009    Hospitalized [facility]  . Migraine headache   . Scoliosis of thoracic spine   . H/O syncope     a. echo 2010: EF 60%, LV nl size, RV nl size & fxn, atria nl size, aorta nl, no pericardial effusion, aortic valve nl, mitral valve nl w/ mild insufficiency, tricuspid valve nl w/ mild insufficiency, pulmonic valve nl  . Shingles      Past Surgical History  Procedure Laterality Date  . Appendectomy  1963  . Abdominal hysterectomy  1991     Family History  Problem Relation Age of Onset  . COPD Mother 13    respiratory failure/fibrosis, seizures  . Stroke Mother   . Hypertension Mother   . COPD Father 2  . Mental illness Father     Parkinson's Dementia  . Aneurysm Father 76  . Heart attack Maternal Grandfather 55    passed  .  Stroke Son 22    hypertensive, left brain   . Supraventricular tachycardia Mother   . Stroke Maternal Grandmother      Social History   Social History  . Marital Status: Married    Spouse Name: N/A  . Number of Children: N/A  . Years of Education: N/A   Occupational History  . Not on file.   Social History Main Topics  . Smoking status: Never Smoker   . Smokeless tobacco: Never Used  . Alcohol Use: No  . Drug Use: No  . Sexual Activity: Not on file   Other Topics Concern  . Not on file   Social History  Narrative     ROS A 10 point review of system was performed. It is negative other than that mentioned in the history of present illness.   PHYSICAL EXAM   BP 118/58 mmHg  Pulse 66  Ht 5\' 2"  (1.575 m)  Wt 119 lb 8 oz (54.205 kg)  BMI 21.85 kg/m2 Constitutional: She is oriented to person, place, and time. She appears well-developed and well-nourished. No distress.  HENT: No nasal discharge.  Head: Normocephalic and atraumatic.  Eyes: Pupils are equal and round. No discharge.  Neck: Normal range of motion. Neck supple. No JVD present. No thyromegaly present.  Cardiovascular: Normal rate, regular rhythm, normal heart sounds. Exam reveals no gallop and no friction rub. No murmur heard.  Pulmonary/Chest: Effort normal and breath sounds normal. No stridor. No respiratory distress. She has no wheezes. She has no rales. She exhibits no tenderness.  Abdominal: Soft. Bowel sounds are normal. She exhibits no distension. There is no tenderness. There is no rebound and no guarding.  Musculoskeletal: Normal range of motion. She exhibits no edema and no tenderness.  Neurological: She is alert and oriented to person, place, and time. Coordination normal.  Skin: Skin is warm and dry. No rash noted. She is not diaphoretic. No erythema. No pallor.  Psychiatric: She has a normal mood and affect. Her behavior is normal. Judgment and thought content normal.     EKG: Normal sinus rhythm. Suspect lead reversal between aVR and 1. Otherwise normal.   ASSESSMENT AND PLAN

## 2015-06-16 NOTE — Patient Instructions (Signed)
Medication Instructions: No changes.   Labwork: None.   Procedures/Testing: None.   Follow-Up: 1 year with Dr. Arida.   Any Additional Special Instructions Will Be Listed Below (If Applicable).   

## 2015-06-16 NOTE — Assessment & Plan Note (Signed)
Marked erythema and mild swelling. Concern for cellulitis vs allergic reaction. Patient to continue antihistamine as she is currently improving. Rx for medrol dosepak given if swelling worsens or redness fails to improve. Additionally, I advised her to use the prescription for doxycycline that I gave her in case the redness worsens or she develops fever/chills/systemic signs (suggestive of cellulitis).

## 2015-06-16 NOTE — Progress Notes (Signed)
   Subjective:  Patient ID: Jean Brooks, female    DOB: 1946-03-11  Age: 69 y.o. MRN: [id number]  CC: Injection reaction  HPI:  69 year old female presents for an acute visit with complaints of an injection site reaction.  Patient received her Pneumovax yesterday and subsequently developed a reaction. She states that she developed significant swelling and associated redness of her left upper arm. She also had a small bruise in the area of the injection. No associated fevers or chills. Patient states that she took some Benadryl with some improvement in her swelling and redness. No known exacerbating factors. No other associated symptoms at this time. Patient is concerned about the area and would like it addressed today.  Social Hx   Social History   Social History  . Marital Status: Married    Spouse Name: N/A  . Number of Children: N/A  . Years of Education: N/A   Social History Main Topics  . Smoking status: Never Smoker   . Smokeless tobacco: Never Used  . Alcohol Use: No  . Drug Use: No  . Sexual Activity: Not Asked   Other Topics Concern  . None   Social History Narrative   Review of Systems  Constitutional: Negative for fever and chills.  Skin:       Redness/swelling of left upper arm.   Objective:  BP 120/76 mmHg  Pulse 74  Temp(Src) 98 F (36.7 C) (Oral)  Ht 5\' 2"  (1.575 m)  Wt 118 lb 8 oz (53.751 kg)  BMI 21.67 kg/m2  SpO2 98%  BP/Weight 06/15/2015 8/41/6606 3/0/1601  Systolic BP 093 98 235  Diastolic BP 76 60 72  Wt. (Lbs) 118.5 116.75 118.5  BMI 21.67 20.84 21.67   Physical Exam  Constitutional: She is oriented to person, place, and time. She appears well-developed and well-nourished. No distress.  Cardiovascular: Normal rate and regular rhythm.   No murmur heard. Pulmonary/Chest: Effort normal and breath sounds normal. No respiratory distress. She has no wheezes. She has no rales.  Neurological: She is alert and oriented to person, place, and  time.  Skin:  Left upper arm (deltoid region and distally) - Small bruise noted at the injection site; Slight swelling noted. Marked erythema noted laterally and medially. Warm to touch.   Psychiatric: She has a normal mood and affect.  Vitals reviewed.  Assessment & Plan:   Problem List Items Addressed This Visit    Injection site reaction - Primary    Marked erythema and mild swelling. Concern for cellulitis vs allergic reaction. Patient to continue antihistamine as she is currently improving. Rx for medrol dosepak given if swelling worsens or redness fails to improve. Additionally, I advised her to use the prescription for doxycycline that I gave her in case the redness worsens or she develops fever/chills/systemic signs (suggestive of cellulitis).         Meds ordered this encounter  Medications  . methylPREDNISolone (MEDROL DOSEPAK) 4 MG TBPK tablet    Sig: As directed.    Dispense:  21 tablet    Refill:  0  . doxycycline (VIBRA-TABS) 100 MG tablet    Sig: Take 1 tablet (100 mg total) by mouth 2 (two) times daily.    Dispense:  20 tablet    Refill:  0    Follow-up: PRN.  Thersa Salt, DO

## 2015-06-16 NOTE — Assessment & Plan Note (Signed)
Previous short runs of SVT on Holter monitor with no symptoms related to this. Continue to monitor.

## 2015-06-16 NOTE — Telephone Encounter (Signed)
Call made to [employer] Adverse vaccine line regarding Pneumovax vaccine reaction, Case Number [id number].  Will follow up.

## 2015-06-19 ENCOUNTER — Encounter: Payer: Self-pay | Admitting: *Deleted

## 2015-06-20 ENCOUNTER — Ambulatory Visit
Admission: RE | Admit: 2015-06-20 | Discharge: 2015-06-20 | Disposition: A | Discharge status: Home / Self Care | Payer: 59 | Source: Ambulatory Visit | Attending: Internal Medicine | Admitting: Internal Medicine

## 2015-06-20 DIAGNOSIS — Z90710 Acquired absence of both cervix and uterus: Secondary | ICD-10-CM | POA: Diagnosis not present

## 2015-06-20 DIAGNOSIS — N8320 Unspecified ovarian cysts: Secondary | ICD-10-CM | POA: Diagnosis not present

## 2015-06-20 DIAGNOSIS — N83202 Unspecified ovarian cyst, left side: Secondary | ICD-10-CM

## 2015-06-20 DIAGNOSIS — R140 Abdominal distension (gaseous): Secondary | ICD-10-CM | POA: Insufficient documentation

## 2015-06-20 DIAGNOSIS — N83201 Unspecified ovarian cyst, right side: Secondary | ICD-10-CM

## 2015-06-22 NOTE — Telephone Encounter (Signed)
See phone note and labs.

## 2015-06-27 ENCOUNTER — Ambulatory Visit: Payer: 59

## 2015-07-04 ENCOUNTER — Ambulatory Visit: Payer: 59 | Attending: Internal Medicine

## 2015-07-11 ENCOUNTER — Telehealth: Payer: Self-pay | Admitting: *Deleted

## 2015-07-11 ENCOUNTER — Other Ambulatory Visit: Payer: Self-pay | Admitting: Internal Medicine

## 2015-07-11 MED ORDER — ZOLPIDEM TARTRATE 10 MG PO TABS: 5.0000 mg | ORAL_TABLET | Freq: Every evening | ORAL | Status: DC | PRN

## 2015-07-11 NOTE — Telephone Encounter (Signed)
Patient stated that she preferred to not take the Xanax, she did not like the feeling. She preferred to continue taking Ambien. Patient requested a Rx for the [facility]

## 2015-07-11 NOTE — Telephone Encounter (Signed)
Please advise refill? 

## 2015-07-11 NOTE — Telephone Encounter (Signed)
Ok to resume [place].  rx printed

## 2015-07-11 NOTE — Telephone Encounter (Signed)
Please advise 

## 2015-07-12 NOTE — Telephone Encounter (Signed)
Left Patient a VM to pick up prescription.

## 2015-07-17 ENCOUNTER — Telehealth: Payer: Self-pay | Admitting: Internal Medicine

## 2015-07-17 MED ORDER — ZOLPIDEM TARTRATE ER 12.5 MG PO TBCR: 12.5000 mg | EXTENDED_RELEASE_TABLET | Freq: Every evening | ORAL | Status: DC | PRN

## 2015-07-17 NOTE — Telephone Encounter (Signed)
Can we Rx Ambien to patient she states she gets the time release medication 12.5 .

## 2015-07-17 NOTE — Telephone Encounter (Signed)
rx correct to Ambien CR and printed

## 2015-07-17 NOTE — Telephone Encounter (Signed)
Pt called stating she was given the incorrect medication zolpidem (AMBIEN) 10 MG tablet this time. Pt thinks that it was a time release medication and it was 12.5mg . Pharmacy is [facility] pharmacy. Thank You!

## 2015-08-08 NOTE — Telephone Encounter (Signed)
Received a form to complete from [employer] for her reaction.  Completed and faxed to [employer] per there request.

## 2015-10-31 ENCOUNTER — Ambulatory Visit: Payer: Self-pay | Admitting: Physician Assistant

## 2015-10-31 ENCOUNTER — Encounter: Payer: Self-pay | Admitting: Physician Assistant

## 2015-10-31 VITALS — BP 110/80 | HR 60 | Temp 97.5°F

## 2015-10-31 DIAGNOSIS — R0982 Postnasal drip: Secondary | ICD-10-CM

## 2015-10-31 NOTE — Progress Notes (Signed)
S: c/o sore throat, no fever/chills, ?some sinus drainage, no cough or head congestion, just wants to make sure its not strep  O: vitals wnl, nad, tms clear, throat with cobblestoning but no redness, neck supple cervical nodes more prominent b/l but pt is very thin, lungs c t a, cv rrr  A: sore throat secondary to post nasal drip  P: reassurance, salt water gargles, decongestant, return if worsening

## 2015-11-14 ENCOUNTER — Inpatient Hospital Stay
Admission: RE | Admit: 2015-11-14 | Discharge: 2015-11-14 | Disposition: A | Discharge status: Home / Self Care | Payer: 59 | Source: Ambulatory Visit | Attending: Internal Medicine | Admitting: Internal Medicine

## 2015-11-14 DIAGNOSIS — Z1239 Encounter for other screening for malignant neoplasm of breast: Secondary | ICD-10-CM

## 2016-01-10 ENCOUNTER — Other Ambulatory Visit: Payer: Self-pay | Admitting: Internal Medicine

## 2016-01-12 ENCOUNTER — Other Ambulatory Visit: Payer: Self-pay | Admitting: Internal Medicine

## 2016-01-15 ENCOUNTER — Other Ambulatory Visit: Payer: Self-pay | Admitting: Internal Medicine

## 2016-01-15 MED ORDER — ZOLPIDEM TARTRATE ER 12.5 MG PO TBCR: EXTENDED_RELEASE_TABLET | ORAL | Status: DC

## 2016-01-15 NOTE — Telephone Encounter (Signed)
Last OV 9/16 ok to refill Ambien?

## 2016-01-15 NOTE — Telephone Encounter (Signed)
Last seen on 06/14/15-no future appt scheduled. Okay to refill?

## 2016-01-15 NOTE — Telephone Encounter (Signed)
Refill for 30 days only.  OFFICE VISIT NEEDED prior to any more refills 

## 2016-02-12 ENCOUNTER — Other Ambulatory Visit: Payer: Self-pay | Admitting: Internal Medicine

## 2016-02-12 NOTE — Telephone Encounter (Signed)
Pt made appt for 06/07 @ 4pm and wanted to know if she can get a refill for her [name]? Thank you!

## 2016-02-12 NOTE — Telephone Encounter (Signed)
Patient has been advised appointment needed for further refill last OV with MD 9/16?

## 2016-02-12 NOTE — Telephone Encounter (Signed)
Please notify patient that the prescription  was Refilled for 30 days

## 2016-02-13 NOTE — Telephone Encounter (Signed)
Please schedule patient a medication follow up appointment, left voicemail advising patient she will need for further refills on medication please schedule patient.

## 2016-03-06 ENCOUNTER — Ambulatory Visit (INDEPENDENT_AMBULATORY_CARE_PROVIDER_SITE_OTHER): Payer: 59 | Admitting: Internal Medicine

## 2016-03-06 ENCOUNTER — Encounter: Payer: Self-pay | Admitting: Internal Medicine

## 2016-03-06 VITALS — BP 128/72 | HR 66 | Temp 98.1°F | Resp 12 | Ht 62.0 in | Wt 117.2 lb

## 2016-03-06 DIAGNOSIS — G4700 Insomnia, unspecified: Secondary | ICD-10-CM

## 2016-03-06 MED ORDER — ZOLPIDEM TARTRATE ER 12.5 MG PO TBCR: EXTENDED_RELEASE_TABLET | ORAL | Status: DC

## 2016-03-06 MED ORDER — TRAZODONE HCL 50 MG PO TABS: 25.0000 mg | ORAL_TABLET | Freq: Every evening | ORAL | Status: DC | PRN

## 2016-03-06 NOTE — Patient Instructions (Signed)
Try taking trazodone 25 or 50 mg (1/2 or 1 whole tablet) at around 8 pm.  Take your ambien at your usual time.   We will repeat your ultrasound in September at the time of your next annual CPE

## 2016-03-06 NOTE — Progress Notes (Signed)
Pre-visit discussion using our clinic review tool. No additional management support is needed unless otherwise documented below in the visit note.  

## 2016-03-06 NOTE — Progress Notes (Signed)
Subjective:  Patient ID: Jean Brooks, female    DOB: 1945/11/26  Age: 70 y.o. MRN: [id number]  CC: The encounter diagnosis was Insomnia, persistent.  HPI Jean Brooks presents for follow up on chronic insomnia managed with trazodone and ambien cR  Patient  not taking trazodone currently . Jean Brooks puts  Her to sleep , but she wakes up  5 hours later, and cant' go back .  Does not occur on the weekend. still working full time at the hospital      Outpatient Prescriptions Prior to Visit  Medication Sig Dispense Refill  . Cholecalciferol (VITAMIN D3) 1000 UNITS CAPS Take 1 capsule by mouth daily. Reported on 10/31/2015    . Multiple Vitamin (MULTIVITAMIN) tablet Take 1 tablet by mouth daily.    Marland Kitchen zolpidem (AMBIEN CR) 12.5 MG CR tablet TAKE 1 TABLET BY MOUTH NIGHTLY AT BEDTIME AS NEEDED FOR SLEEP 30 tablet 0  . conjugated estrogens (PREMARIN) vaginal cream Place 0.5 Applicatorfuls vaginally daily. For two weeks,  Then twice weekly thereafter (Patient not taking: Reported on 10/31/2015) 42.5 g 12  . doxycycline (VIBRA-TABS) 100 MG tablet Take 1 tablet (100 mg total) by mouth 2 (two) times daily. (Patient not taking: Reported on 10/31/2015) 20 tablet 0  . methylPREDNISolone (MEDROL DOSEPAK) 4 MG TBPK tablet As directed. (Patient not taking: Reported on 10/31/2015) 21 tablet 0  . traZODone (DESYREL) 50 MG tablet Take 0.5-1 tablets (25-50 mg total) by mouth at bedtime as needed for sleep. (Patient not taking: Reported on 10/31/2015) 30 tablet 3   No facility-administered medications prior to visit.    Review of Systems;  Patient denies headache, fevers, malaise, unintentional weight loss, skin rash, eye pain, sinus congestion and sinus pain, sore throat, dysphagia,  hemoptysis , cough, dyspnea, wheezing, chest pain, palpitations, orthopnea, edema, abdominal pain, nausea, melena, diarrhea, constipation, flank pain, dysuria, hematuria, urinary  Frequency, nocturia, numbness, tingling, seizures,   Focal weakness, Loss of consciousness,  Tremor,  depression, anxiety, and suicidal ideation.      Objective:  BP 128/72 mmHg  Pulse 66  Temp(Src) 98.1 F (36.7 C) (Oral)  Resp 12  Ht 5\' 2"  (1.575 m)  Wt 117 lb 4 oz (53.184 kg)  BMI 21.44 kg/m2  SpO2 98%  BP Readings from Last 3 Encounters:  03/06/16 128/72  10/31/15 110/80  06/16/15 118/58    Wt Readings from Last 3 Encounters:  03/06/16 117 lb 4 oz (53.184 kg)  06/16/15 119 lb 8 oz (54.205 kg)  06/15/15 118 lb 8 oz (53.751 kg)    General appearance: alert, cooperative and appears stated age Ears: normal TM's and external ear canals both ears Throat: lips, mucosa, and tongue normal; teeth and gums normal Neck: no adenopathy, no carotid bruit, supple, symmetrical, trachea midline and thyroid not enlarged, symmetric, no tenderness/mass/nodules Back: symmetric, no curvature. ROM normal. No CVA tenderness. Lungs: clear to auscultation bilaterally Heart: regular rate and rhythm, S1, S2 normal, no murmur, click, rub or gallop Abdomen: soft, non-tender; bowel sounds normal; no masses,  no organomegaly Pulses: 2+ and symmetric Skin: Skin color, texture, turgor normal. No rashes or lesions Lymph nodes: Cervical, supraclavicular, and axillary nodes normal.  No results found for: HGBA1C  Lab Results  Component Value Date   CREATININE 0.74 06/14/2015   CREATININE 0.62 04/05/2015   CREATININE 0.8 05/31/2014    Lab Results  Component Value Date   WBC 5.9 06/14/2015   HGB 13.5 06/14/2015   HCT 40.4 06/14/2015  PLT 235.0 06/14/2015   GLUCOSE 87 06/14/2015   CHOL 199 06/14/2015   TRIG 86.0 06/14/2015   HDL 63.50 06/14/2015   LDLCALC 118* 06/14/2015   ALT 12 06/14/2015   AST 15 06/14/2015   NA 139 06/14/2015   K 4.3 06/14/2015   CL 104 06/14/2015   CREATININE 0.74 06/14/2015   BUN 18 06/14/2015   CO2 30 06/14/2015   TSH 1.70 06/14/2015    No results found.  Assessment & Plan:   Problem List Items Addressed  This Visit    Insomnia, persistent - Primary    Suggested adding trazodoen 1.5 hours before bedtime and continue ambien cr         I have discontinued [name]'s conjugated estrogens, methylPREDNISolone, and doxycycline. I am also having her maintain her multivitamin, Vitamin D3, traZODone, and zolpidem.  Meds ordered this encounter  Medications  . traZODone (DESYREL) 50 MG tablet    Sig: Take 0.5-1 tablets (25-50 mg total) by mouth at bedtime as needed for sleep.    Dispense:  30 tablet    Refill:  3  . zolpidem (AMBIEN CR) 12.5 MG CR tablet    Sig: TAKE 1 TABLET BY MOUTH NIGHTLY AT BEDTIME AS NEEDED FOR SLEEP    Dispense:  30 tablet    Refill:  5    KEEP ON FILE FOR FUTURE REFILLS    Medications Discontinued During This Encounter  Medication Reason  . conjugated estrogens (PREMARIN) vaginal cream Completed Course  . doxycycline (VIBRA-TABS) 100 MG tablet Completed Course  . methylPREDNISolone (MEDROL DOSEPAK) 4 MG TBPK tablet Completed Course  . traZODone (DESYREL) 50 MG tablet Reorder  . zolpidem (AMBIEN CR) 12.5 MG CR tablet Reorder    Follow-up: No Follow-up on file.   Crecencio Mc, MD

## 2016-03-07 NOTE — Assessment & Plan Note (Signed)
Suggested adding trazodoen 1.5 hours before bedtime and continue ambien cr

## 2016-04-03 ENCOUNTER — Telehealth: Payer: Self-pay | Admitting: Cardiovascular Disease

## 2016-04-03 NOTE — Telephone Encounter (Signed)
Left message on machine for patient to contact the office.   

## 2016-04-03 NOTE — Telephone Encounter (Signed)
Patient says she had pre syncope sx.  Patient was at [employer] and felt nauseated hot faint.  Patient is still weak .  Scheduled 04-25-16  With Arida .    Please call to discuss.

## 2016-04-04 NOTE — Telephone Encounter (Signed)
Left message on machine for patient to contact the office.   

## 2016-04-22 ENCOUNTER — Ambulatory Visit (INDEPENDENT_AMBULATORY_CARE_PROVIDER_SITE_OTHER): Payer: 59 | Admitting: Family Medicine

## 2016-04-22 ENCOUNTER — Telehealth: Payer: Self-pay | Admitting: Internal Medicine

## 2016-04-22 ENCOUNTER — Encounter: Payer: Self-pay | Admitting: Family Medicine

## 2016-04-22 VITALS — BP 102/64 | HR 69 | Temp 98.2°F | Wt 114.2 lb

## 2016-04-22 DIAGNOSIS — R002 Palpitations: Secondary | ICD-10-CM

## 2016-04-22 DIAGNOSIS — Z9189 Other specified personal risk factors, not elsewhere classified: Secondary | ICD-10-CM | POA: Diagnosis not present

## 2016-04-22 DIAGNOSIS — Z87898 Personal history of other specified conditions: Secondary | ICD-10-CM

## 2016-04-22 LAB — COMPREHENSIVE METABOLIC PANEL
ALBUMIN: 4.3 g/dL (ref 3.5–5.2)
ALT: 15 U/L (ref 0–35)
AST: 17 U/L (ref 0–37)
Alkaline Phosphatase: 54 U/L (ref 39–117)
BUN: 13 mg/dL (ref 6–23)
CHLORIDE: 105 meq/L (ref 96–112)
CO2: 28 mEq/L (ref 19–32)
CREATININE: 0.7 mg/dL (ref 0.40–1.20)
Calcium: 9.8 mg/dL (ref 8.4–10.5)
GFR: 88.23 mL/min (ref >60.00)
GLUCOSE: 93 mg/dL (ref 70–99)
POTASSIUM: 4.2 meq/L (ref 3.5–5.1)
SODIUM: 139 meq/L (ref 135–145)
Total Bilirubin: 0.6 mg/dL (ref 0.2–1.2)
Total Protein: 7 g/dL (ref 6.0–8.3)

## 2016-04-22 LAB — CBC
HCT: 39.4 % (ref 36.0–46.0)
Hemoglobin: 13.3 g/dL (ref 12.0–15.0)
MCHC: 33.6 g/dL (ref 30.0–36.0)
MCV: 88.4 fl (ref 78.0–100.0)
PLATELETS: 229 10*3/uL (ref 150.0–400.0)
RBC: 4.46 Mil/uL (ref 3.87–5.11)
RDW: 12.9 % (ref 11.5–15.5)
WBC: 5.4 10*3/uL (ref 4.0–10.5)

## 2016-04-22 LAB — TSH: TSH: 1.67 u[IU]/mL (ref 0.35–4.50)

## 2016-04-22 NOTE — Assessment & Plan Note (Signed)
Patient with possible syncopal event 3 weeks ago and presyncopal event yesterday. I'm unsure if she truly had a syncopal event as she was able to hear her husband the entire time she was down. She had no incontinence or postictal phase to indicate seizure activity. No shaking either. Could be arrhythmia related. Could be vasovagal though no apparent reason to be vasovagal. Could be related to low blood sugar. We obtained an EKG today which was reassuring. We will obtain lab work as well today to rule out other causes. She'll keep her follow-up with the cardiologist later this week. She is given return precautions.

## 2016-04-22 NOTE — Telephone Encounter (Signed)
Patient Name: Jean Brooks DOB: 09/08/1946 Initial Comment Caller states she felts shakey and faint yesterday, did not pass out then but did faint in the previous incident Nurse Assessment Nurse: Andria Frames, RN, Aeriel Date/Time (Eastern Time): 04/22/2016 9:01:19 AM Confirm and document reason for call. If symptomatic, describe symptoms. You must click the next button to save text entered. ---Caller states, she felts shaky and faint yesterday, did not pass out then but did faint in the previous incident on the fourth. Caller states, today she feels like she has been in a wrestling match. No pain she got short of breathe and hot yesterday. Caller states, she has issues with feeling like she has tremble and shaky. Has the patient traveled out of the country within the last 30 days? ---No Does the patient have any new or worsening symptoms? ---Yes Will a triage be completed? ---Yes Related visit to physician within the last 2 weeks? ---No Does the PT have any chronic conditions? (i.e. diabetes, asthma, etc.) ---Yes List chronic conditions. ---diverticulitis Is this a behavioral health or substance abuse call? ---No Guidelines Guideline Title Affirmed Question Affirmed Notes Muscle Jerks - Tics - Shudders [1] New-onset muscle jerks AND [2] unexplained AND [3] 3 or more times/day Final Disposition User See Physician within 4 Hours (or PCP triage) Hensel, RN, Aeriel Comments she is jittery. Caller has also had some issues with muscle tics and jerks with her eyes. No appt available in recommended time frame with PCP. Appt scheduled for 11:15am with Dr. Caryl Bis. Referrals REFERRED TO PCP OFFICE Disagree/Comply: Comply

## 2016-04-22 NOTE — Telephone Encounter (Signed)
FYI about your [phone]

## 2016-04-22 NOTE — Progress Notes (Signed)
  Tommi Rumps, MD Phone: [phone]  Jean Brooks is a 70 y.o. female who presents today for same-day visit.  Patient reports on July 4 she had a possible syncopal event. She had been at [place] and then also got hot and felt as though she could not hold herself still though she had no shaking. She felt as though she was going to faint though did not at that time. She was able walk out of [employer] on her own and got home. Once at home she felt as though she passed out though she could hear her husband the whole time. No urinary incontinence. No postictal phase. Had some shortness of breath afterwards though this resolved quickly. No chest pain. Some increased heart rate with the event. Notes this in total lasted 30 minutes. Felt tingling in her fingers and toes with this. Had similar event yesterday though did not pass out. Lasted for about 15 minutes. Felt as though she was going to faint but did not. Legs felt like Jell-O. Some mild shortness of breath after this. No chest pain. Some palpitations. She was at [facility] when this occurred. She is followed by cardiology. Has a history of paroxysmal SVT. She is asymptomatic at this time.  PMH: History of paroxysmal SVT   ROS see history of present illness  Objective  Physical Exam Vitals:   04/22/16 1100  BP: 102/64  Pulse: 69  Temp: 98.2 F (36.8 C)    BP Readings from Last 3 Encounters:  04/22/16 102/64  03/06/16 128/72  10/31/15 110/80   Wt Readings from Last 3 Encounters:  04/22/16 114 lb 4 oz (51.8 kg)  03/06/16 117 lb 4 oz (53.2 kg)  06/16/15 119 lb 8 oz (54.2 kg)    Physical Exam  Constitutional: No distress.  HENT:  Head: Normocephalic and atraumatic.  Mouth/Throat: Oropharynx is clear and moist.  Eyes: Conjunctivae are normal. Pupils are equal, round, and reactive to light.  Cardiovascular: Normal rate, regular rhythm and normal heart sounds.   Pulmonary/Chest: Effort normal and breath sounds normal.    Neurological: She is alert. Gait normal.  CN 2-12 intact, 5/5 strength in bilateral biceps, triceps, grip, quads, hamstrings, plantar and dorsiflexion, sensation to light touch intact in bilateral UE and LE, normal gait, 2+ patellar reflexes  Skin: Skin is warm and dry. She is not diaphoretic.   EKG: Normal sinus rhythm, rate 66, new T-wave inversion in V2, ST segment in V3 does not reach 1 mm, otherwise appears similar to prior EKG  Assessment/Plan: Please see individual problem list.  H/O syncope Patient with possible syncopal event 3 weeks ago and presyncopal event yesterday. I'm unsure if she truly had a syncopal event as she was able to hear her husband the entire time she was down. She had no incontinence or postictal phase to indicate seizure activity. No shaking either. Could be arrhythmia related. Could be vasovagal though no apparent reason to be vasovagal. Could be related to low blood sugar. We obtained an EKG today which was reassuring. We will obtain lab work as well today to rule out other causes. She'll keep her follow-up with the cardiologist later this week. She is given return precautions.   Orders Placed This Encounter  Procedures  . CBC  . TSH  . Comp Met (CMET)  . EKG 12-Lead    No orders of the defined types were placed in this encounter.  Tommi Rumps, MD [place]

## 2016-04-22 NOTE — Patient Instructions (Addendum)
Nice to see you. We will check some lab work to ensure there is no other cause for your symptoms. We will have you follow-up with your cardiologist as scheduled. If you develop chest pain, shortness of breath, recurrent syncope, palpitations, or any new or changing symptoms please seek medical attention immediately.

## 2016-04-24 ENCOUNTER — Telehealth: Payer: Self-pay | Admitting: *Deleted

## 2016-04-24 NOTE — Telephone Encounter (Signed)
patient saw dr Caryl Bis and all las were ordered by him

## 2016-04-24 NOTE — Telephone Encounter (Signed)
Patient had labs completed on 7/24, She wants them mailed which once they are resulted I will complete, thanks

## 2016-04-24 NOTE — Telephone Encounter (Signed)
Patient has requested to have her lab results mailed

## 2016-04-25 ENCOUNTER — Encounter: Payer: Self-pay | Admitting: Cardiovascular Disease

## 2016-04-25 ENCOUNTER — Ambulatory Visit (INDEPENDENT_AMBULATORY_CARE_PROVIDER_SITE_OTHER): Payer: 59 | Admitting: Cardiovascular Disease

## 2016-04-25 VITALS — BP 104/64 | HR 67 | Ht 64.0 in | Wt 114.2 lb

## 2016-04-25 DIAGNOSIS — I471 Supraventricular tachycardia: Secondary | ICD-10-CM | POA: Diagnosis not present

## 2016-04-25 DIAGNOSIS — R0600 Dyspnea, unspecified: Secondary | ICD-10-CM | POA: Diagnosis not present

## 2016-04-25 DIAGNOSIS — Z9189 Other specified personal risk factors, not elsewhere classified: Secondary | ICD-10-CM | POA: Diagnosis not present

## 2016-04-25 DIAGNOSIS — Z87898 Personal history of other specified conditions: Secondary | ICD-10-CM

## 2016-04-25 NOTE — Patient Instructions (Addendum)
Medication Instructions:  Your physician recommends that you continue on your current medications as directed. Please refer to the Current Medication list given to you today.   Labwork: none  Testing/Procedures: Your physician has requested that you have an echocardiogram. Echocardiography is a painless test that uses sound waves to create images of your heart. It provides your doctor with information about the size and shape of your heart and how well your heart's chambers and valves are working. This procedure takes approximately one hour. There are no restrictions for this procedure.  Your physician has recommended that you wear an event monitor. Event monitors are medical devices that record the heart's electrical activity. Doctors most often [place] these monitors to diagnose arrhythmias. Arrhythmias are problems with the speed or rhythm of the heartbeat. The monitor is a small, portable device. You can wear one while you do your normal daily activities. This is usually used to diagnose what is causing palpitations/syncope (passing out).    Follow-Up: Your physician wants you to follow-up in: one year with Dr. Fletcher Anon.  You will receive a reminder letter in the mail two months in advance. If you don't receive a letter, please call our office to schedule the follow-up appointment.   Any Other Special Instructions Will Be Listed Below (If Applicable).     If you need a refill on your cardiac medications before your next appointment, please call your pharmacy.  Echocardiogram An echocardiogram, or echocardiography, uses sound waves (ultrasound) to produce an image of your heart. The echocardiogram is simple, painless, obtained within a short period of time, and offers valuable information to your health care provider. The images from an echocardiogram can provide information such as:  Evidence of coronary artery disease (CAD).  Heart size.  Heart muscle function.  Heart valve  function.  Aneurysm detection.  Evidence of a past heart attack.  Fluid buildup around the heart.  Heart muscle thickening.  Assess heart valve function. LET [facility] CARE PROVIDER KNOW ABOUT:  Any allergies you have.  All medicines you are taking, including vitamins, herbs, eye drops, creams, and over-the-counter medicines.  Previous problems you or members of your family have had with the use of anesthetics.  Any blood disorders you have.  Previous surgeries you have had.  Medical conditions you have.  Possibility of pregnancy, if this applies. BEFORE THE PROCEDURE  No special preparation is needed. Eat and drink normally.  PROCEDURE   In order to produce an image of your heart, gel will be applied to your chest and a wand-like tool (transducer) will be moved over your chest. The gel will help transmit the sound waves from the transducer. The sound waves will harmlessly bounce off your heart to allow the heart images to be captured in real-time motion. These images will then be recorded.  You may need an IV to receive a medicine that improves the quality of the pictures. AFTER THE PROCEDURE You may return to your normal schedule including diet, activities, and medicines, unless your health care provider tells you otherwise.   This information is not intended to replace advice given to you by your health care provider. Make sure you discuss any questions you have with your health care provider.   Document Released: 09/13/2000 Document Revised: 10/07/2014 Document Reviewed: 05/24/2013 Elsevier Interactive Patient Education 2016 [place].  Cardiac Event Monitoring A cardiac event monitor is a small recording device used to help detect abnormal heart rhythms (arrhythmias). The monitor is used to record heart  rhythm when noticeable symptoms such as the following occur:  Fast heartbeats (palpitations), such as heart racing or fluttering.  Dizziness.  Fainting or  light-headedness.  Unexplained weakness. The monitor is wired to two electrodes placed on your chest. Electrodes are flat, sticky disks that attach to your skin. The monitor can be worn for up to 30 days. You will wear the monitor at all times, except when bathing.  HOW TO USE YOUR CARDIAC EVENT MONITOR A technician will prepare your chest for the electrode placement. The technician will show you how to place the electrodes, how to work the monitor, and how to replace the batteries. Take time to practice using the monitor before you leave the office. Make sure you understand how to send the information from the monitor to your health care provider. This requires a telephone with a landline, not a cell phone. You need to:  Wear your monitor at all times, except when you are in water:  Do not get the monitor wet.  Take the monitor off when bathing. Do not swim or use a hot tub with it on.  Keep your skin clean. Do not put body lotion or moisturizer on your chest.  Change the electrodes daily or any time they stop sticking to your skin. You might need to use tape to keep them on.  It is possible that your skin under the electrodes could become irritated. To keep this from happening, try to put the electrodes in slightly different places on your chest. However, they must remain in the area under your left breast and in the upper right section of your chest.  Make sure the monitor is safely clipped to your clothing or in a location close to your body that your health care provider recommends.  Press the button to record when you feel symptoms of heart trouble, such as dizziness, weakness, light-headedness, palpitations, thumping, shortness of breath, unexplained weakness, or a fluttering or racing heart. The monitor is always on and records what happened slightly before you pressed the button, so do not worry about being too late to get good information.  Keep a diary of your activities, such as  walking, doing chores, and taking medicine. It is especially important to note what you were doing when you pushed the button to record your symptoms. This will help your health care provider determine what might be contributing to your symptoms. The information stored in your monitor will be reviewed by your health care provider alongside your diary entries.  Send the recorded information as recommended by your health care provider. It is important to understand that it will take some time for your health care provider to process the results.  Change the batteries as recommended by your health care provider. SEEK IMMEDIATE MEDICAL CARE IF:   You have chest pain.  You have extreme difficulty breathing or shortness of breath.  You develop a very fast heartbeat that persists.  You develop dizziness that does not go away.  You faint or constantly feel you are about to faint.   This information is not intended to replace advice given to you by your health care provider. Make sure you discuss any questions you have with your health care provider.   Document Released: 06/25/2008 Document Revised: 10/07/2014 Document Reviewed: 03/15/2013 Elsevier Interactive Patient Education [employer].

## 2016-04-25 NOTE — Progress Notes (Signed)
Cardiology Office Note   Date:  04/25/2016   ID:  Jean Brooks, DOB 1946-03-19, MRN [id number]  PCP:  Crecencio Mc, MD  Cardiologist:   Kathlyn Sacramento, MD   Chief Complaint  Patient presents with  . Other    12 month follow up. Meds reviewed by the patient verbally. "doing well."       History of Present Illness: Jean Brooks is a 70 y.o. female who presents for a follow-up visit regarding possible paroxysmal supraventricular tachycardia. She had presyncopal episode in 2010 which was likely vasovagal in nature. She underwent cardiac evaluation at [facility] which was unremarkable. She had a stress echocardiogram which was normal. Holter monitor showed sinus rhythm with short runs of SVT. Atrial Fibrillation could not be excluded. Echocardiogram showed normal LV systolic function, mild mitral and tricuspid regurgitation.  She was seen in July, 2016 by Christell Faith for atypical chest pain. A stress test was ordered. However the patient reports resolution of symptoms and she did not proceed with stress testing. She is referred back by Dr. Caryl Bis for evaluation of presyncope. She had 2 episodes recently. The first one was on July 4 while she was shopping at [place]. She felt extremely dizzy and lightheaded after excessive heat. She was in a tight space and felt somewhat anxious. She did not pass out completely and her husband was able to take her back to the car and turn on the air conditioning but she continued to be dizzy until she got home. She had a very similar episode last week while she was at [place]. It was the same kind of description. She felt that her heart slowed down significantly followed by significant tachycardia. She also had significant shortness of breath with no chest pain.  She had routine labs performed which overall were unremarkable.    Past Medical History:  Diagnosis Date  . Chest pain, non-cardiac 2010   a. stress echo 2010: nl treadmill EKG w/o evidence  of ischemia or arrhythmia, good exercise tolerance for age, normal stress echo images w/o evidence of myocardial ischemia; b. baseline echo with EF 55% peak stress showed nl LV systolic function wtih EF 65% w/o evidence of HK  . Diverticulitis of colon with hemorrhage August 2009   Hospitalized [facility]  . H/O syncope    a. echo 2010: EF 60%, LV nl size, RV nl size & fxn, atria nl size, aorta nl, no pericardial effusion, aortic valve nl, mitral valve nl w/ mild insufficiency, tricuspid valve nl w/ mild insufficiency, pulmonic valve nl  . Migraine headache   . Scoliosis of thoracic spine   . Shingles     Past Surgical History:  Procedure Laterality Date  . ABDOMINAL HYSTERECTOMY  1991  . APPENDECTOMY  1963     Current Outpatient Prescriptions  Medication Sig Dispense Refill  . Cholecalciferol (VITAMIN D3) 1000 UNITS CAPS Take 1 capsule by mouth daily. Reported on 10/31/2015    . Multiple Vitamin (MULTIVITAMIN) tablet Take 1 tablet by mouth daily.    . traZODone (DESYREL) 50 MG tablet Take 0.5-1 tablets (25-50 mg total) by mouth at bedtime as needed for sleep. 30 tablet 3  . zolpidem (AMBIEN CR) 12.5 MG CR tablet TAKE 1 TABLET BY MOUTH NIGHTLY AT BEDTIME AS NEEDED FOR SLEEP 30 tablet 5   No current facility-administered medications for this visit.     Allergies:   Sulfa antibiotics; Erythromycin; Flagyl [metronidazole]; Nitrofurantoin monohyd macro; Penicillins; Azithromycin; Ciprofloxacin; and Ibuprofen  Social History:  The patient  reports that she has never smoked. She has never used smokeless tobacco. She reports that she does not drink alcohol or use drugs.   Family History:  The patient's family history includes Aneurysm (age of onset: 58) in her father; COPD (age of onset: 19) in her father; COPD (age of onset: 47) in her mother; Heart attack (age of onset: 45) in her maternal grandfather; Hypertension in her mother; Mental illness in her father; Stroke in her maternal grandmother  and mother; Stroke (age of onset: 24) in her son; Supraventricular tachycardia in her mother.    ROS:  Please see the history of present illness.   Otherwise, review of systems are positive for none.   All other systems are reviewed and negative.    PHYSICAL EXAM: VS:  BP 104/64 (BP Location: Left Arm, Patient Position: Sitting, Cuff Size: Normal)   Pulse 67   Ht 5\' 4"  (1.626 m)   Wt 114 lb 4 oz (51.8 kg)   BMI 19.61 kg/m  , BMI Body mass index is 19.61 kg/m. GEN: Well nourished, well developed, in no acute distress  HEENT: normal  Neck: no JVD, carotid bruits, or masses Cardiac: RRR; no murmurs, rubs, or gallops,no edema  Respiratory:  clear to auscultation bilaterally, normal work of breathing GI: soft, nontender, nondistended, + BS MS: no deformity or atrophy  Skin: warm and dry, no rash Neuro:  Strength and sensation are intact Psych: euthymic mood, full affect   EKG:  EKG is ordered today. The ekg ordered today demonstrates normal sinus rhythm with no significant ST or T wave changes.  Recent Labs: 04/22/2016: ALT 15; BUN 13; Creatinine, Ser 0.70; Hemoglobin 13.3; Platelets 229.0; Potassium 4.2; Sodium 139; TSH 1.67    Lipid Panel    Component Value Date/Time   CHOL 199 06/14/2015 1122   CHOL 184 03/25/2013 0743   TRIG 86.0 06/14/2015 1122   TRIG 54 03/25/2013 0743   HDL 63.50 06/14/2015 1122   HDL 66 (H) 03/25/2013 0743   CHOLHDL 3 06/14/2015 1122   VLDL 17.2 06/14/2015 1122   VLDL 11 03/25/2013 0743   LDLCALC 118 (H) 06/14/2015 1122   LDLCALC 107 (H) 03/25/2013 0743      Wt Readings from Last 3 Encounters:  04/25/16 114 lb 4 oz (51.8 kg)  04/22/16 114 lb 4 oz (51.8 kg)  03/06/16 117 lb 4 oz (53.2 kg)        ASSESSMENT AND PLAN:  1.  Recent presyncope: Likely vasovagal. However, she describes tachycardia during the episode which raises the possibility of arrhythmia. She has history of short runs of supraventricular tachycardia in the past. I requested  a 30 day outpatient telemetry. Her episodes are not happening frequently enough to be captured on a Holter monitor. I advised her to stay well-hydrated. Given the associated shortness of breath, I requested an echocardiogram to ensure no structural heart abnormalities.  2. History of paroxysmal supraventricular tachycardia: EKG today showed normal sinus rhythm with no significant ST or T-wave changes and normal PR and QT intervals. Currently she is  not on any medication for this given that she had no documented clinical episodes other than what has been seen on monitoring 7 years ago.  Disposition:   FU with me in 1 year  Signed,  Kathlyn Sacramento, MD  04/25/2016 8:45 AM    [place]

## 2016-04-25 NOTE — Telephone Encounter (Signed)
Please advise when labs are resulted, patient would like them mailed, see note below, thanks

## 2016-04-26 NOTE — Telephone Encounter (Signed)
Have tried to call the patient two times. LM for patient to return call

## 2016-04-30 ENCOUNTER — Ambulatory Visit (INDEPENDENT_AMBULATORY_CARE_PROVIDER_SITE_OTHER): Payer: 59

## 2016-04-30 DIAGNOSIS — Z9189 Other specified personal risk factors, not elsewhere classified: Secondary | ICD-10-CM

## 2016-04-30 DIAGNOSIS — R55 Syncope and collapse: Secondary | ICD-10-CM | POA: Diagnosis not present

## 2016-04-30 NOTE — Telephone Encounter (Signed)
Mailed lab results. 

## 2016-05-01 DIAGNOSIS — L812 Freckles: Secondary | ICD-10-CM | POA: Diagnosis not present

## 2016-05-01 DIAGNOSIS — L820 Inflamed seborrheic keratosis: Secondary | ICD-10-CM | POA: Diagnosis not present

## 2016-05-01 DIAGNOSIS — L720 Epidermal cyst: Secondary | ICD-10-CM | POA: Diagnosis not present

## 2016-05-01 DIAGNOSIS — D229 Melanocytic nevi, unspecified: Secondary | ICD-10-CM | POA: Diagnosis not present

## 2016-05-01 DIAGNOSIS — L578 Other skin changes due to chronic exposure to nonionizing radiation: Secondary | ICD-10-CM | POA: Diagnosis not present

## 2016-05-01 DIAGNOSIS — L821 Other seborrheic keratosis: Secondary | ICD-10-CM | POA: Diagnosis not present

## 2016-05-01 DIAGNOSIS — L739 Follicular disorder, unspecified: Secondary | ICD-10-CM | POA: Diagnosis not present

## 2016-05-14 ENCOUNTER — Ambulatory Visit (INDEPENDENT_AMBULATORY_CARE_PROVIDER_SITE_OTHER): Payer: 59

## 2016-05-14 ENCOUNTER — Other Ambulatory Visit: Payer: Self-pay

## 2016-05-14 DIAGNOSIS — R0600 Dyspnea, unspecified: Secondary | ICD-10-CM | POA: Diagnosis not present

## 2016-05-14 LAB — ECHOCARDIOGRAM COMPLETE
AOASC: 27 cm
CHL CUP DOP CALC LVOT VTI: 17 cm
E decel time: 317 msec
EERAT: 8.85
FS: 63 % — AB (ref 28–44)
IVS/LV PW RATIO, ED: 1.07
LA ID, A-P, ES: 20 mm
LA vol A4C: 16.1 ml
LA vol index: 11.9 mL/m2
LADIAMINDEX: 1.3 cm/m2
LAVOL: 18.4 mL
LDCA: 2.54 cm2
LEFT ATRIUM END SYS DIAM: 20 mm
LV E/e' medial: 8.85
LV E/e'average: 8.85
LV TDI E'LATERAL: 7.4
LV TDI E'MEDIAL: 6.2
LV e' LATERAL: 7.4 cm/s
LVOTD: 18 mm
LVOTPV: 86.5 cm/s
LVOTSV: 43 mL
MV Dec: 317
MV pk A vel: 88.8 m/s
MV pk E vel: 65.5 m/s
PW: 9.42 mm — AB (ref 0.6–1.1)
RV TAPSE: 19.6 mm
Reg peak vel: 224 cm/s
TR max vel: 224 cm/s

## 2016-05-20 ENCOUNTER — Emergency Department
Admission: EM | Admit: 2016-05-20 | Discharge: 2016-05-20 | Disposition: A | Discharge status: Home / Self Care | Payer: 59 | Attending: Emergency Medicine | Admitting: Emergency Medicine

## 2016-05-20 ENCOUNTER — Emergency Department: Payer: 59

## 2016-05-20 ENCOUNTER — Encounter: Payer: Self-pay | Admitting: Emergency Medicine

## 2016-05-20 DIAGNOSIS — R0602 Shortness of breath: Secondary | ICD-10-CM | POA: Diagnosis not present

## 2016-05-20 DIAGNOSIS — R55 Syncope and collapse: Secondary | ICD-10-CM | POA: Insufficient documentation

## 2016-05-20 DIAGNOSIS — R531 Weakness: Secondary | ICD-10-CM | POA: Diagnosis present

## 2016-05-20 LAB — CBC
HCT: 41.9 % (ref 35.0–47.0)
HEMOGLOBIN: 14.6 g/dL (ref 12.0–16.0)
MCH: 30.9 pg (ref 26.0–34.0)
MCHC: 34.9 g/dL (ref 32.0–36.0)
MCV: 88.4 fL (ref 80.0–100.0)
Platelets: 243 10*3/uL (ref 150–440)
RBC: 4.74 MIL/uL (ref 3.80–5.20)
RDW: 13.3 % (ref 11.5–14.5)
WBC: 6.1 10*3/uL (ref 3.6–11.0)

## 2016-05-20 LAB — URINALYSIS COMPLETE WITH MICROSCOPIC (ARMC ONLY)
Bacteria, UA: NONE SEEN
Bilirubin Urine: NEGATIVE
Glucose, UA: NEGATIVE mg/dL
Hgb urine dipstick: NEGATIVE
LEUKOCYTES UA: NEGATIVE
NITRITE: NEGATIVE
PH: 9 — AB (ref 5.0–8.0)
PROTEIN: NEGATIVE mg/dL
RBC / HPF: NONE SEEN RBC/hpf (ref 0–5)
Specific Gravity, Urine: 1.009 (ref 1.005–1.030)

## 2016-05-20 LAB — BASIC METABOLIC PANEL
ANION GAP: 11 (ref 5–15)
BUN: 18 mg/dL (ref 6–20)
CALCIUM: 9.7 mg/dL (ref 8.9–10.3)
CHLORIDE: 106 mmol/L (ref 101–111)
CO2: 20 mmol/L — AB (ref 22–32)
Creatinine, Ser: 0.81 mg/dL (ref 0.44–1.00)
GFR calc non Af Amer: >60 mL/min (ref >60)
Glucose, Bld: 114 mg/dL — ABNORMAL HIGH (ref 65–99)
Potassium: 3.6 mmol/L (ref 3.5–5.1)
Sodium: 137 mmol/L (ref 135–145)

## 2016-05-20 LAB — TROPONIN I

## 2016-05-20 LAB — TSH: TSH: 2.016 u[IU]/mL (ref 0.350–4.500)

## 2016-05-20 MED ORDER — SODIUM CHLORIDE 0.9 % IV BOLUS (SEPSIS)
1000.0000 mL | Freq: Once | INTRAVENOUS | Status: AC
Start: 2016-05-20 — End: 2016-05-20
  Administered 2016-05-20: 1000 mL via INTRAVENOUS

## 2016-05-20 MED ORDER — ONDANSETRON HCL 4 MG/2ML IJ SOLN
4.0000 mg | Freq: Once | INTRAMUSCULAR | Status: AC
  Administered 2016-05-20: 4 mg via INTRAVENOUS
  Filled 2016-05-20: qty 2

## 2016-05-20 NOTE — ED Triage Notes (Signed)
Pt was at work and started feeling bad.  Weak, sob and like passiing out.  Pt brought from floor on stretcher.  She is alert, anxious, flat on the bed.

## 2016-05-20 NOTE — ED Provider Notes (Signed)
[facility] Emergency Department Provider Note  ____________________________________________  Time seen: Approximately 10:45 AM  I have reviewed the triage vital signs and the nursing notes.   HISTORY  Chief Complaint Weakness   HPI Jean Brooks is a 70 y.o. female with a history of syncope episodes who presents for evaluation of near syncope. Patient is seen by Dr. Fletcher Anon cardiology and is currently using the cardiac monitoring for these episodes. She has had an extensive workup including echo and stress test which were normal. Patient reports that since July 4 that she has had 4 episodes. She had one episode yesterday while standing in line at the [place]. She reports that these episodes start with flushing sensation on her face, she gets very red and feels diaphoretic and hot. She then feels very lightheaded. She walked to her car in turn on the air-conditioning. Her husband then drove her home and when she got home she sat on a chair and had a syncopal episode lasting a few seconds. She got better throughout the day but reports she didn't eat dinner or breakfast this morning. 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 She felt like she was going to pass out but never fully syncopized. According to her coworkers she was extremely pale. Patient reports that she feels tingling sensation in her whole body when these episodes happen. She denies chest pain, headache, facial droop, weakness or numbness of her extremities, gait instability, vertigo. She does report that during these episodes she feels short of breath however once the episodes resolve the shortness of breath goes away. She denies abdominal pain, nausea, vomiting, diarrhea, dysuria.  Past Medical History:  Diagnosis Date  . Chest pain, non-cardiac 2010   a. stress echo 2010: nl treadmill EKG  w/o evidence of ischemia or arrhythmia, good exercise tolerance for age, normal stress echo images w/o evidence of myocardial ischemia; b. baseline echo with EF 55% peak stress showed nl LV systolic function wtih EF 65% w/o evidence of HK  . Diverticulitis of colon with hemorrhage August 2009   Hospitalized [facility]  . H/O syncope    a. echo 2010: EF 60%, LV nl size, RV nl size & fxn, atria nl size, aorta nl, no pericardial effusion, aortic valve nl, mitral valve nl w/ mild insufficiency, tricuspid valve nl w/ mild insufficiency, pulmonic valve nl  . Migraine headache   . Scoliosis of thoracic spine   . Shingles     Patient Active Problem List   Diagnosis Date Noted  . Injection site reaction 06/16/2015  . Osteopenia 06/14/2015  . SVT (supraventricular tachycardia) ([place]) 06/07/2014  . Encounter for preventive health examination 03/24/2013  . Dyspareunia, female 03/23/2013  . Constipation 10/06/2012  . Abnormal mammogram 09/03/2012  . Cyst, ovarian 12/29/2011  . Diverticulitis of colon with hemorrhage   . Migraine headache   . Scoliosis of thoracic spine   . H/O syncope   . Insomnia, persistent 12/25/2011  . Chest pain, non-cardiac     Past Surgical History:  Procedure Laterality Date  . ABDOMINAL HYSTERECTOMY  1991  . APPENDECTOMY  1963    Prior to Admission medications   Medication Sig Start Date End Date Taking? Authorizing Provider  Cholecalciferol (VITAMIN D3) 1000 UNITS CAPS Take 1 capsule by mouth daily. Reported on 10/31/2015   Yes Historical Provider, MD  Multiple Vitamin (MULTIVITAMIN)  tablet Take 1 tablet by mouth daily.   Yes Historical Provider, MD  traZODone (DESYREL) 50 MG tablet Take 0.5-1 tablets (25-50 mg total) by mouth at bedtime as needed for sleep. 03/06/16  Yes Crecencio Mc, MD  zolpidem (AMBIEN CR) 12.5 MG CR tablet TAKE 1 TABLET BY MOUTH NIGHTLY AT BEDTIME AS NEEDED FOR SLEEP 03/06/16  Yes Crecencio Mc, MD    Allergies Sulfa antibiotics; Erythromycin;  Flagyl [metronidazole]; Nitrofurantoin monohyd macro; Penicillins; Azithromycin; Ciprofloxacin; and Ibuprofen  Family History  Problem Relation Age of Onset  . COPD Mother 59    respiratory failure/fibrosis, seizures  . Stroke Mother   . Hypertension Mother   . Supraventricular tachycardia Mother   . COPD Father 6  . Mental illness Father     Parkinson's Dementia  . Aneurysm Father 51  . Stroke Son 45    hypertensive, left brain   . Heart attack Maternal Grandfather 55    passed  . Stroke Maternal Grandmother     Social History Social History  Substance Use Topics  . Smoking status: Never Smoker  . Smokeless tobacco: Never Used  . Alcohol use No    Review of Systems  Constitutional: Negative for fever. Eyes: Negative for visual changes. ENT: Negative for sore throat. Cardiovascular: Negative for chest pain. Respiratory: Negative for shortness of breath. Gastrointestinal: Negative for abdominal pain, vomiting or diarrhea. Genitourinary: Negative for dysuria. Musculoskeletal: Negative for back pain. Skin: Negative for rash. Neurological: Negative for headaches, weakness or numbness. + lightheadedness  ____________________________________________   PHYSICAL EXAM:  VITAL SIGNS: ED Triage Vitals  Enc Vitals Group     BP 05/20/16 0907 124/85     Pulse Rate 05/20/16 0907 88     Resp 05/20/16 0907 (!) 22     Temp --      Temp src --      SpO2 05/20/16 0907 100 %     Weight 05/20/16 0908 113 lb (51.3 kg)     Height 05/20/16 0908 5\' 3"  (1.6 m)     Head Circumference --      Peak Flow --      Pain Score --      Pain Loc --      Pain Edu? --      Excl. in [place]? --     Constitutional: Alert and oriented. Well appearing and in no apparent distress. HEENT:      Head: Normocephalic and atraumatic.         Eyes: Conjunctivae are normal. Sclera is non-icteric. EOMI. PERRL      Mouth/Throat: Mucous membranes are moist.       Neck: Supple with no signs of  meningismus. Cardiovascular: Regular rate and rhythm. No murmurs, gallops, or rubs. 2+ symmetrical distal pulses are present in all extremities. No JVD. Respiratory: Normal respiratory effort. Lungs are clear to auscultation bilaterally. No wheezes, crackles, or rhonchi.  Gastrointestinal: Soft, non tender, and non distended with positive bowel sounds. No rebound or guarding. Genitourinary: No CVA tenderness. Musculoskeletal: Nontender with normal range of motion in all extremities. No edema, cyanosis, or erythema of extremities. Neurologic: Normal speech and language. A & O x3, PERRL, no nystagmus, CN II-XII intact, motor testing reveals good tone and bulk throughout. There is no evidence of pronator drift or dysmetria. Muscle strength is 5/5 throughout. Deep tendon reflexes are 2+ throughout with downgoing toes. Sensory examination is intact. Gait is normal. Skin: Skin is warm, dry and intact. No rash noted. Psychiatric:  Mood and affect are normal. Speech and behavior are normal.  ____________________________________________   LABS (all labs ordered are listed, but only abnormal results are displayed)  Labs Reviewed  BASIC METABOLIC PANEL - Abnormal; Notable for the following:       Result Value   CO2 20 (*)    Glucose, Bld 114 (*)    All other components within normal limits  CBC  TROPONIN I  TSH  URINALYSIS COMPLETEWITH MICROSCOPIC ([facility] ONLY)   ____________________________________________  EKG  ED ECG REPORT I, Rudene Re, the attending physician, personally viewed and interpreted this ECG.  Normal rhythm, normal intervals, normal axis, no STE or depressions, no evidence of HOCM, AV block, delta wave, ARVD, prolonged QTc, WPW.  ____________________________________________  RADIOLOGY  CXR: negative  ____________________________________________   PROCEDURES  Procedure(s) performed: None Procedures Critical Care performed:   None ____________________________________________   INITIAL IMPRESSION / ASSESSMENT AND PLAN / ED COURSE  70 y.o. female with a history of syncope episodes who presents for evaluation of near syncope. Patient has had extensive cardiac workup including echo and stress test and is currently using a cardiac monitor to evaluate these episodes of syncope. Patient reports flushing and lightheadedness preceding these episodes. She denies palpitations or chest pain. She is neurologically intact and has no headache. Her physical exam is within normal limits. Her blood pressure was found to be in the upper 90s on arrival and patient reports she hasn't eaten much yesterday and today. She'll be given IV fluids. We'll check labs, TSH, EKG and discussed patient with her cardiologist Dr. Fletcher Anon.   Clinical Course  Comment By Time  Spoke with Dr. Fletcher Anon who will evaluate cardiac monitor and get back to me. Rudene Re, MD 08/21 1112  Dr. Fletcher Anon reports and no arrhythmias were seen on cardiac monitor both episodes from yesterday and today. Patient remains at her baseline. She is neurologically intact. Has no chest pain or any other medical complaints at this time. She received IV fluids and feels improved. We'll discharge her home and close follow-up with primary care doctor for further evaluation of vasovagal episodes. Discussed return precautions with patient and her husband. Patient is also to follow up with Dr. Fletcher Anon on her already scheduled appointment in the end of the month. Rudene Re, MD 08/21 1213    Pertinent labs & imaging results that were available during my care of the patient were reviewed by me and considered in my medical decision making (see chart for details).    ____________________________________________   FINAL CLINICAL IMPRESSION(S) / ED DIAGNOSES  Final diagnoses:  Near syncope      NEW MEDICATIONS STARTED DURING THIS VISIT:  New Prescriptions   No medications on  file     Note:  This document was prepared using Dragon voice recognition software and may include unintentional dictation errors.    Rudene Re, MD 05/20/16 [phone]

## 2016-05-20 NOTE — Discharge Instructions (Signed)
You have been seen today in the Emergency Department (ED)  for syncope (passing out).  Your workup including labs and EKG did not show a cause for your syncope and were overall reassuring.   ° °Your symptoms may be due to dehydration, so it is important that you drink plenty of non-alcoholic fluids. Emotional stress, pain, or overheating--especially if you have been standing--can make you faint. In these cases, fainting is usually not serious. But fainting can be a sign of a more serious problem. Therefore it is imperative that you follow up with your doctor in 1-2 days for further evaluation. ° °When should you call for help?  °Call 911 anytime you think you may need emergency care. For example, call if:  °You have symptoms of a heart problem. These may include:  °Chest pain or pressure.  °Severe trouble breathing.  °A fast or irregular heartbeat.  °Lightheadedness or sudden weakness.  °Coughing up pink, foamy mucus.  °Passing out. °You have symptoms of a stroke. These may include:  °Sudden numbness, tingling, weakness, or loss of movement in your face, arm, or leg, especially on only one side of your body.  °Sudden vision changes.  °Sudden trouble speaking.  °Sudden confusion or trouble understanding simple statements.  °Sudden problems with walking or balance.  °A sudden, severe headache that is different from past headaches. °You passed out (lost consciousness) again. ° °Watch closely for changes in your health, and be sure to contact your doctor if:  °You do not get better as expected. ° ° How can you care for yourself at home?  °Drink plenty of fluids to prevent dehydration. If you have kidney, heart, or liver disease and have to limit fluids, talk with your doctor before you increase your fluid intake. ° ° °

## 2016-05-21 ENCOUNTER — Telehealth: Payer: Self-pay | Admitting: *Deleted

## 2016-05-21 ENCOUNTER — Telehealth: Payer: Self-pay | Admitting: Cardiovascular Disease

## 2016-05-21 ENCOUNTER — Ambulatory Visit (INDEPENDENT_AMBULATORY_CARE_PROVIDER_SITE_OTHER): Payer: 59 | Admitting: Internal Medicine

## 2016-05-21 ENCOUNTER — Encounter: Payer: Self-pay | Admitting: Internal Medicine

## 2016-05-21 VITALS — Temp 98.1°F | Resp 18 | Ht 64.0 in | Wt 111.4 lb

## 2016-05-21 DIAGNOSIS — R7989 Other specified abnormal findings of blood chemistry: Secondary | ICD-10-CM

## 2016-05-21 DIAGNOSIS — R55 Syncope and collapse: Secondary | ICD-10-CM

## 2016-05-21 DIAGNOSIS — R739 Hyperglycemia, unspecified: Secondary | ICD-10-CM

## 2016-05-21 DIAGNOSIS — I951 Orthostatic hypotension: Secondary | ICD-10-CM

## 2016-05-21 DIAGNOSIS — I9589 Other hypotension: Secondary | ICD-10-CM | POA: Diagnosis not present

## 2016-05-21 DIAGNOSIS — E2740 Unspecified adrenocortical insufficiency: Secondary | ICD-10-CM

## 2016-05-21 MED ORDER — ONDANSETRON HCL 4 MG PO TABS: 4.0000 mg | ORAL_TABLET | Freq: Three times a day (TID) | ORAL | 0 refills | Status: DC | PRN

## 2016-05-21 MED ORDER — ZOLPIDEM TARTRATE ER 12.5 MG PO TBCR: EXTENDED_RELEASE_TABLET | ORAL | 5 refills | Status: DC

## 2016-05-21 NOTE — Telephone Encounter (Signed)
She has been scheduled.

## 2016-05-21 NOTE — Telephone Encounter (Signed)
Can I schedule her for your 1630 today? Please advise.

## 2016-05-21 NOTE — Telephone Encounter (Signed)
Patient was seen in the ER for syncope on 08/21([facility]). She was advised to see the her PCP in 1-2 days. Please give a time and date for pt to be seen.  Pt contact [phone]

## 2016-05-21 NOTE — Progress Notes (Signed)
Subjective:  Patient ID: Jean Brooks, female    DOB: 1946-08-10  Age: 70 y.o. MRN: [id number]  CC: The primary encounter diagnosis was Orthostatic hypotension. Diagnoses of Other specified hypotension, Hyperglycemia, Syncope, unspecified syncope type, Syncope and collapse, and Low serum cortisol level ([place]) were also pertinent to this visit.  HPI KAYLY SCHOLES presents for evaluation of recurrent episodes of syncope and presyncope.  Eosiodes have been occurring since. since July 4  First episode occurred in July while at [employer]  Felt flushed,  Overheated,  Then nauseated .  Did not pass out or vomit but was close.  Extremities feel tingly during episodes  2nd episode occurred at [facility]  a week later,  Same symptoms  With dry heaving x 1.   Yesterday: nearly passed out in her chair at work,  [employer] headed,  Lexicographer over to lower head, felt presyncopal staff put her on a stretcher and took her to ER.   30 day monitor was on her yesterday during episode , no arrhythmias were seen.    weight has been relatively stable. No recent use of steroids.  Recent addition of trazodone at night   Outpatient Medications Prior to Visit  Medication Sig Dispense Refill  . Cholecalciferol (VITAMIN D3) 1000 UNITS CAPS Take 1 capsule by mouth daily. Reported on 10/31/2015    . Multiple Vitamin (MULTIVITAMIN) tablet Take 1 tablet by mouth daily.    . traZODone (DESYREL) 50 MG tablet Take 0.5-1 tablets (25-50 mg total) by mouth at bedtime as needed for sleep. 30 tablet 3  . zolpidem (AMBIEN CR) 12.5 MG CR tablet TAKE 1 TABLET BY MOUTH NIGHTLY AT BEDTIME AS NEEDED FOR SLEEP 30 tablet 5   No facility-administered medications prior to visit.     Review of Systems;  Patient denies headache, fevers, malaise, unintentional weight loss, skin rash, eye pain, sinus congestion and sinus pain, sore throat, dysphagia,  hemoptysis , cough, dyspnea, wheezing, chest pain, palpitations, orthopnea, edema,  abdominal pain, nausea, melena, diarrhea, constipation, flank pain, dysuria, hematuria, urinary  Frequency, nocturia, numbness, tingling, seizures,  Focal weakness, Loss of consciousness,  Tremor, insomnia, depression, anxiety, and suicidal ideation.      Objective:  Temp 98.1 F (36.7 C) (Oral)   Resp 18   Ht 5\' 4"  (1.626 m)   Wt 111 lb 6 oz (50.5 kg)   SpO2 97%   BMI 19.12 kg/m   BP Readings from Last 3 Encounters:  05/20/16 123/67  04/25/16 104/64  04/22/16 102/64    Wt Readings from Last 3 Encounters:  05/21/16 111 lb 6 oz (50.5 kg)  05/20/16 113 lb (51.3 kg)  04/25/16 114 lb 4 oz (51.8 kg)    General appearance: alert, cooperative and appears stated age Ears: normal TM's and external ear canals both ears Throat: lips, mucosa, and tongue normal; teeth and gums normal Neck: no adenopathy, no carotid bruit, supple, symmetrical, trachea midline and thyroid not enlarged, symmetric, no tenderness/mass/nodules Back: symmetric, no curvature. ROM normal. No CVA tenderness. Lungs: clear to auscultation bilaterally Heart: regular rate and rhythm, S1, S2 normal, no murmur, click, rub or gallop Abdomen: soft, non-tender; bowel sounds normal; no masses,  no organomegaly Pulses: 2+ and symmetric Skin: Skin color, texture, turgor normal. No rashes or lesions Lymph nodes: Cervical, supraclavicular, and axillary nodes normal.  Lab Results  Component Value Date   HGBA1C 5.7 05/21/2016    Lab Results  Component Value Date   CREATININE 0.71 05/21/2016  CREATININE 0.81 05/20/2016   CREATININE 0.70 04/22/2016    Lab Results  Component Value Date   WBC 6.1 05/20/2016   HGB 14.6 05/20/2016   HCT 41.9 05/20/2016   PLT 243 05/20/2016   GLUCOSE 83 05/21/2016   CHOL 199 06/14/2015   TRIG 86.0 06/14/2015   HDL 63.50 06/14/2015   LDLCALC 118 (H) 06/14/2015   ALT 15 04/22/2016   AST 17 04/22/2016   NA 139 05/21/2016   K 4.0 05/21/2016   CL 107 05/21/2016   CREATININE 0.71  05/21/2016   BUN 13 05/21/2016   CO2 24 05/21/2016   TSH 2.016 05/20/2016   HGBA1C 5.7 05/21/2016    Dg Chest 1 View  Result Date: 05/20/2016 CLINICAL DATA:  Weakness, shortness of Breath. EXAM: CHEST 1 VIEW COMPARISON:  05/11/2008 FINDINGS: Leftward scoliosis of the thoracic spine. Lungs are clear. Heart is normal size. No effusions. No acute bony abnormality. Battery pack projects over the upper chest in the midline. IMPRESSION: No active disease. Electronically Signed   By: Rolm Baptise M.D.   On: 05/20/2016 11:25    Assessment & Plan:   Problem List Items Addressed This Visit    Syncope and collapse    Recurrent since July 4.  She has mild orthostatic hypotension and Labs today suggest she may be s adrenally insufficiency given her afternoon cortisol level of 2.7 (normal is 3.1 to 17).   Will have patient return for a early morning cortisol level along with ACTH renin and aldosterone levels. Alternative diagnoses also being pursued , including cerebrovascular disease, with carotid dopplers and MRI/MRA      Relevant Orders   Cortisol   ACTH   Aldosterone + renin activity w/ ratio   Ambulatory referral to Endocrinology    Other Visit Diagnoses    Orthostatic hypotension    -  Primary   Relevant Orders   Aldosterone + renin activity w/ ratio   Ambulatory referral to Endocrinology   Other specified hypotension       Relevant Orders   24 hour blood pressure monitor   Hyperglycemia       Syncope, unspecified syncope type       Relevant Orders   24 hour blood pressure monitor   US Carotid Duplex Bilateral   MR Brain Wo Contrast   MR Angiogram Head Wo Contrast   Low serum cortisol level ([place])       Relevant Orders   Ambulatory referral to Endocrinology     A total of 40 minutes was spent with patient more than half of which was spent in counseling patient on the above mentioned issues , reviewing and explaining recent labs and imaging studies done, and coordination of  care.  I am having Ms. Adriance start on ondansetron. I am also having her maintain her multivitamin, Vitamin D3, traZODone, and zolpidem.  Meds ordered this encounter  Medications  . ondansetron (ZOFRAN) 4 MG tablet    Sig: Take 1 tablet (4 mg total) by mouth every 8 (eight) hours as needed for nausea or vomiting.    Dispense:  20 tablet    Refill:  0  . zolpidem (AMBIEN CR) 12.5 MG CR tablet    Sig: TAKE 1 TABLET BY MOUTH NIGHTLY AT BEDTIME AS NEEDED FOR SLEEP    Dispense:  30 tablet    Refill:  5    KEEP ON FILE FOR FUTURE REFILLS    Medications Discontinued During This Encounter  Medication Reason  . zolpidem (  AMBIEN CR) 12.5 MG CR tablet Reorder    Follow-up: No Follow-up on file.   Crecencio Mc, MD

## 2016-05-21 NOTE — Telephone Encounter (Signed)
YES 4:30 TODAY.  PLEASE OBTAIN ORTHOSTATIC VITAL SIGNS  ONCE PATIENT IS ROOMED AND RECORD IN CHART.  SHE WILL NEED LABS SO YOU CAN SEND HER T O COME EARLY AND GO TO  LAB AS WELL SO WE DON'T END UP RUNNING OVER 5:00

## 2016-05-21 NOTE — Progress Notes (Signed)
Pre-visit discussion using our clinic review tool. No additional management support is needed unless otherwise documented below in the visit note.  

## 2016-05-21 NOTE — Telephone Encounter (Signed)
Left message as requested on pt home VM advising her to continue to wear event monitor for the full 30 days. Provided CB number if further questions.

## 2016-05-21 NOTE — Telephone Encounter (Signed)
Please advise Jean Brooks.  This is the person I came to you about to open schedule slot.

## 2016-05-21 NOTE — Telephone Encounter (Signed)
Pt states she was in the ED with a syncope episode, states they ruled out it was heart related. Pt asks if she still needs to wear the heart monitor. Please call and advise. States  If she doesn't answer, it is ok to leave on cell vm. [phone]

## 2016-05-21 NOTE — Patient Instructions (Signed)
Please increase your salt intake  And make sure you eat something every 3 hours while awake  Stop the trazodone, and resume ambien CR  Carotid dopplers 24 hour ambulatory BP monitor MRI/MRA brain   Have been ordered for further evaluation of recurrent syncope

## 2016-05-22 LAB — BASIC METABOLIC PANEL
BUN: 13 mg/dL (ref 6–23)
CALCIUM: 9 mg/dL (ref 8.4–10.5)
CO2: 24 mEq/L (ref 19–32)
Chloride: 107 mEq/L (ref 96–112)
Creatinine, Ser: 0.71 mg/dL (ref 0.40–1.20)
GFR: 86.77 mL/min (ref >60.00)
GLUCOSE: 83 mg/dL (ref 70–99)
Potassium: 4 mEq/L (ref 3.5–5.1)
SODIUM: 139 meq/L (ref 135–145)

## 2016-05-22 LAB — D-DIMER, QUANTITATIVE (NOT AT ARMC): D DIMER QUANT: 0.21 ug{FEU}/mL (ref <0.50)

## 2016-05-22 LAB — HEMOGLOBIN A1C: HEMOGLOBIN A1C: 5.7 % (ref 4.6–6.5)

## 2016-05-22 LAB — CORTISOL: CORTISOL PLASMA: 2.7 ug/dL

## 2016-05-23 ENCOUNTER — Encounter: Payer: Self-pay | Admitting: Internal Medicine

## 2016-05-23 ENCOUNTER — Telehealth: Payer: Self-pay | Admitting: Internal Medicine

## 2016-05-23 NOTE — Telephone Encounter (Signed)
Patient's afternoon cortisol was low.  I need her to return on Monday for 8 am labs she needs to be fasting.  Endocrinology referral is also in process

## 2016-05-23 NOTE — Assessment & Plan Note (Addendum)
Recurrent since July 4.  She has mild orthostatic hypotension and Labs today suggest she may be s adrenally insufficiency given her afternoon cortisol level of 2.7 (normal is 3.1 to 17).   Will have patient return for a early morning cortisol level along with ACTH renin and aldosterone levels. Alternative diagnoses also being pursued , including cerebrovascular disease, with carotid dopplers and MRI/MRA

## 2016-05-24 ENCOUNTER — Encounter: Payer: Self-pay | Admitting: *Deleted

## 2016-05-24 ENCOUNTER — Telehealth: Payer: Self-pay | Admitting: *Deleted

## 2016-05-24 NOTE — Telephone Encounter (Signed)
FYI pt confirmed lab appt

## 2016-05-24 NOTE — Telephone Encounter (Signed)
Sent patient My chart message and left message on mobile number to call office. Lab appointment set for Monday at 8 am.

## 2016-05-27 ENCOUNTER — Other Ambulatory Visit (INDEPENDENT_AMBULATORY_CARE_PROVIDER_SITE_OTHER): Payer: 59

## 2016-05-27 DIAGNOSIS — I951 Orthostatic hypotension: Secondary | ICD-10-CM

## 2016-05-27 DIAGNOSIS — R55 Syncope and collapse: Secondary | ICD-10-CM | POA: Diagnosis not present

## 2016-05-27 LAB — CORTISOL: CORTISOL PLASMA: 13.1 ug/dL

## 2016-05-28 ENCOUNTER — Ambulatory Visit
Admission: RE | Admit: 2016-05-28 | Discharge: 2016-05-28 | Disposition: A | Discharge status: Home / Self Care | Payer: 59 | Source: Ambulatory Visit | Attending: Internal Medicine | Admitting: Internal Medicine

## 2016-05-28 DIAGNOSIS — I6521 Occlusion and stenosis of right carotid artery: Secondary | ICD-10-CM | POA: Diagnosis not present

## 2016-05-28 DIAGNOSIS — R55 Syncope and collapse: Secondary | ICD-10-CM

## 2016-05-29 ENCOUNTER — Encounter: Payer: Self-pay | Admitting: Internal Medicine

## 2016-05-29 ENCOUNTER — Telehealth: Payer: Self-pay | Admitting: *Deleted

## 2016-05-29 DIAGNOSIS — R300 Dysuria: Secondary | ICD-10-CM

## 2016-05-29 LAB — ALDOSTERONE + RENIN ACTIVITY W/ RATIO

## 2016-05-29 LAB — ACTH: C206 ACTH: 13 pg/mL (ref 6–50)

## 2016-05-29 NOTE — Telephone Encounter (Signed)
NOT NECESSARY.  SHE IS SEEING ENDOCRINOLOGY SOON AND THEY WILL DO THE TEST IF IT IS NEEDED

## 2016-05-29 NOTE — Telephone Encounter (Signed)
[facility] called & states that They only received one frozen lab & needed to separate frozens to run both the [place]. They are able to run the [facility] only. PT will need to come back to repeat Aldosterone if it is okay with you. Please let me know so that I can contact the patient.  Thanks

## 2016-05-30 ENCOUNTER — Encounter: Payer: Self-pay | Admitting: Internal Medicine

## 2016-05-30 DIAGNOSIS — I951 Orthostatic hypotension: Secondary | ICD-10-CM | POA: Diagnosis not present

## 2016-05-30 NOTE — Telephone Encounter (Signed)
Thanks, noted

## 2016-05-31 ENCOUNTER — Other Ambulatory Visit (INDEPENDENT_AMBULATORY_CARE_PROVIDER_SITE_OTHER): Payer: 59

## 2016-05-31 ENCOUNTER — Telehealth: Payer: Self-pay | Admitting: *Deleted

## 2016-05-31 ENCOUNTER — Encounter: Payer: Self-pay | Admitting: *Deleted

## 2016-05-31 DIAGNOSIS — R300 Dysuria: Secondary | ICD-10-CM

## 2016-05-31 LAB — POCT URINALYSIS DIPSTICK
Bilirubin, UA: NEGATIVE
Glucose, UA: NEGATIVE
KETONES UA: NEGATIVE
Nitrite, UA: NEGATIVE
PROTEIN UA: NEGATIVE
SPEC GRAV UA: 1.015
Urobilinogen, UA: 0.2
pH, UA: 7

## 2016-05-31 NOTE — Telephone Encounter (Signed)
Left message on voicemail fine for patient to bring in Hospital cup long as a clean catch.

## 2016-05-31 NOTE — Telephone Encounter (Signed)
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 Patient is requesting a call back. Her number is  [phone] until 3:30pm.

## 2016-05-31 NOTE — Telephone Encounter (Signed)
Juliann Pulse please advise, hospital cup should be fine right?

## 2016-06-01 LAB — URINALYSIS, ROUTINE W REFLEX MICROSCOPIC
BILIRUBIN URINE: NEGATIVE
GLUCOSE, UA: NEGATIVE
HGB URINE DIPSTICK: NEGATIVE
Ketones, ur: NEGATIVE
Nitrite: NEGATIVE
PROTEIN: NEGATIVE
Specific Gravity, Urine: 1.008 (ref 1.001–1.035)
pH: 7 (ref 5.0–8.0)

## 2016-06-01 LAB — URINALYSIS, MICROSCOPIC ONLY
Bacteria, UA: NONE SEEN [HPF]
Casts: NONE SEEN [LPF]
Crystals: NONE SEEN [HPF]
RBC / HPF: NONE SEEN RBC/HPF (ref <=2)
Squamous Epithelial / LPF: NONE SEEN [HPF] (ref <=5)
YEAST: NONE SEEN [HPF]

## 2016-06-02 ENCOUNTER — Encounter: Payer: Self-pay | Admitting: Internal Medicine

## 2016-06-03 ENCOUNTER — Encounter: Payer: Self-pay | Admitting: Internal Medicine

## 2016-06-04 ENCOUNTER — Ambulatory Visit
Admission: RE | Admit: 2016-06-04 | Discharge: 2016-06-04 | Disposition: A | Discharge status: Home / Self Care | Payer: 59 | Source: Ambulatory Visit | Attending: Internal Medicine | Admitting: Internal Medicine

## 2016-06-04 ENCOUNTER — Ambulatory Visit: Payer: 59

## 2016-06-04 DIAGNOSIS — R9082 White matter disease, unspecified: Secondary | ICD-10-CM | POA: Diagnosis not present

## 2016-06-04 DIAGNOSIS — R55 Syncope and collapse: Secondary | ICD-10-CM | POA: Diagnosis not present

## 2016-06-04 DIAGNOSIS — M47812 Spondylosis without myelopathy or radiculopathy, cervical region: Secondary | ICD-10-CM | POA: Diagnosis not present

## 2016-06-04 LAB — URINE CULTURE

## 2016-06-05 ENCOUNTER — Encounter: Payer: Self-pay | Admitting: Internal Medicine

## 2016-06-06 DIAGNOSIS — H6123 Impacted cerumen, bilateral: Secondary | ICD-10-CM | POA: Diagnosis not present

## 2016-06-06 DIAGNOSIS — R42 Dizziness and giddiness: Secondary | ICD-10-CM | POA: Diagnosis not present

## 2016-06-06 MED ORDER — NITROFURANTOIN MONOHYD MACRO 100 MG PO CAPS: 100.0000 mg | ORAL_CAPSULE | Freq: Two times a day (BID) | ORAL | 0 refills | Status: DC

## 2016-06-06 NOTE — Telephone Encounter (Signed)
Pt sent a message to Dr Derrel Nip regarding what medication she take.   Pharmacy is [facility], [address]  Call pt @ [phone] 7:30-3:30pm. Thank you!

## 2016-06-06 NOTE — Telephone Encounter (Signed)
Nitrofurantoin sent to [facility] Please start ASAP and take  twice daily for 7 days

## 2016-06-07 ENCOUNTER — Encounter: Payer: Self-pay | Admitting: Internal Medicine

## 2016-06-07 ENCOUNTER — Encounter: Payer: Self-pay | Admitting: Endocrinology

## 2016-06-12 DIAGNOSIS — H2513 Age-related nuclear cataract, bilateral: Secondary | ICD-10-CM | POA: Diagnosis not present

## 2016-06-14 ENCOUNTER — Telehealth: Payer: Self-pay | Admitting: Internal Medicine

## 2016-06-14 MED ORDER — FLUCONAZOLE 150 MG PO TABS: 150.0000 mg | ORAL_TABLET | Freq: Every day | ORAL | 0 refills | Status: DC

## 2016-06-14 NOTE — Telephone Encounter (Signed)
Patient stated that she finished up her antibiotic yesterday.  She noticed that she has developed a yeast infection due to her medication. Patient also stated that everytime she takes an antibiotic she develops a yeast infection. She would like medication sent to her pharmacy. Please advise.

## 2016-06-14 NOTE — Telephone Encounter (Signed)
Patient has been notified

## 2016-06-14 NOTE — Telephone Encounter (Signed)
Pt states that she has finished her antibiotics  And now has a yeast infection and wants to know if your will call something in.. Please advise pt at [phone]

## 2016-06-14 NOTE — Telephone Encounter (Signed)
Fluconazole sent to pharamcy

## 2016-06-19 ENCOUNTER — Encounter: Payer: Self-pay | Admitting: Internal Medicine

## 2016-06-24 ENCOUNTER — Telehealth: Payer: Self-pay | Admitting: Cardiovascular Disease

## 2016-06-24 NOTE — Telephone Encounter (Signed)
Pt would like holter monitor results. Please call after 12 .

## 2016-06-24 NOTE — Telephone Encounter (Signed)
Results in MD basket awaiting review

## 2016-06-26 ENCOUNTER — Encounter: Payer: Self-pay | Admitting: *Deleted

## 2016-06-26 ENCOUNTER — Ambulatory Visit (INDEPENDENT_AMBULATORY_CARE_PROVIDER_SITE_OTHER): Payer: 59 | Admitting: Internal Medicine

## 2016-06-26 VITALS — BP 104/70 | HR 76 | Temp 98.1°F | Resp 12 | Ht 63.0 in | Wt 111.5 lb

## 2016-06-26 DIAGNOSIS — K5909 Other constipation: Secondary | ICD-10-CM

## 2016-06-26 DIAGNOSIS — Z0000 Encounter for general adult medical examination without abnormal findings: Secondary | ICD-10-CM

## 2016-06-26 DIAGNOSIS — N83202 Unspecified ovarian cyst, left side: Secondary | ICD-10-CM

## 2016-06-26 DIAGNOSIS — Z1239 Encounter for other screening for malignant neoplasm of breast: Secondary | ICD-10-CM

## 2016-06-26 DIAGNOSIS — I471 Supraventricular tachycardia: Secondary | ICD-10-CM

## 2016-06-26 DIAGNOSIS — E559 Vitamin D deficiency, unspecified: Secondary | ICD-10-CM | POA: Diagnosis not present

## 2016-06-26 DIAGNOSIS — R55 Syncope and collapse: Secondary | ICD-10-CM

## 2016-06-26 DIAGNOSIS — E785 Hyperlipidemia, unspecified: Secondary | ICD-10-CM

## 2016-06-26 LAB — COMPREHENSIVE METABOLIC PANEL
ALK PHOS: 59 U/L (ref 39–117)
ALT: 13 U/L (ref 0–35)
AST: 17 U/L (ref 0–37)
Albumin: 4.1 g/dL (ref 3.5–5.2)
BILIRUBIN TOTAL: 0.5 mg/dL (ref 0.2–1.2)
BUN: 13 mg/dL (ref 6–23)
CALCIUM: 9.2 mg/dL (ref 8.4–10.5)
CO2: 31 meq/L (ref 19–32)
CREATININE: 0.74 mg/dL (ref 0.40–1.20)
Chloride: 104 mEq/L (ref 96–112)
GFR: 82.7 mL/min (ref >60.00)
Glucose, Bld: 87 mg/dL (ref 70–99)
Potassium: 4 mEq/L (ref 3.5–5.1)
SODIUM: 140 meq/L (ref 135–145)
TOTAL PROTEIN: 7.2 g/dL (ref 6.0–8.3)

## 2016-06-26 LAB — CBC WITH DIFFERENTIAL/PLATELET
BASOS ABS: 0 10*3/uL (ref 0.0–0.1)
BASOS PCT: 0.7 % (ref 0.0–3.0)
EOS ABS: 0.1 10*3/uL (ref 0.0–0.7)
Eosinophils Relative: 2.3 % (ref 0.0–5.0)
HCT: 40.4 % (ref 36.0–46.0)
HEMOGLOBIN: 13.9 g/dL (ref 12.0–15.0)
LYMPHS PCT: 21.8 % (ref 12.0–46.0)
Lymphs Abs: 1.2 10*3/uL (ref 0.7–4.0)
MCHC: 34.5 g/dL (ref 30.0–36.0)
MCV: 88.6 fl (ref 78.0–100.0)
MONO ABS: 0.5 10*3/uL (ref 0.1–1.0)
Monocytes Relative: 8.4 % (ref 3.0–12.0)
NEUTROS ABS: 3.7 10*3/uL (ref 1.4–7.7)
Neutrophils Relative %: 66.8 % (ref 43.0–77.0)
PLATELETS: 257 10*3/uL (ref 150.0–400.0)
RBC: 4.55 Mil/uL (ref 3.87–5.11)
RDW: 13.1 % (ref 11.5–15.5)
WBC: 5.6 10*3/uL (ref 4.0–10.5)

## 2016-06-26 LAB — VITAMIN D 25 HYDROXY (VIT D DEFICIENCY, FRACTURES): VITD: 29.32 ng/mL — ABNORMAL LOW (ref 30.00–100.00)

## 2016-06-26 LAB — LIPID PANEL
CHOLESTEROL: 201 mg/dL — AB (ref 0–200)
HDL: 69.1 mg/dL (ref >39.00)
LDL Cholesterol: 122 mg/dL — ABNORMAL HIGH (ref 0–99)
NonHDL: 132.24
Total CHOL/HDL Ratio: 3
Triglycerides: 52 mg/dL (ref 0.0–149.0)
VLDL: 10.4 mg/dL (ref 0.0–40.0)

## 2016-06-26 LAB — TSH: TSH: 1.58 u[IU]/mL (ref 0.35–4.50)

## 2016-06-26 NOTE — Patient Instructions (Signed)
I MAY recommend starting a cholesterol medication for PREVENTION of heart attacks and strokes    Menopause is a normal process in which your reproductive ability comes to an end. This process happens gradually over a span of months to years, usually between the ages of 27 and 56. Menopause is complete when you have missed 12 consecutive menstrual periods. It is important to talk with your health care provider about some of the most common conditions that affect postmenopausal women, such as heart disease, cancer, and bone loss (osteoporosis). Adopting a healthy lifestyle and getting preventive care can help to promote your health and wellness. Those actions can also lower your chances of developing some of these common conditions. WHAT SHOULD I KNOW ABOUT MENOPAUSE? During menopause, you may experience a number of symptoms, such as:  Moderate-to-severe hot flashes.  Night sweats.  Decrease in sex drive.  Mood swings.  Headaches.  Tiredness.  Irritability.  Memory problems.  Insomnia. Choosing to treat or not to treat menopausal changes is an individual decision that you make with your health care provider. WHAT SHOULD I KNOW ABOUT HORMONE REPLACEMENT THERAPY AND SUPPLEMENTS? Hormone therapy products are effective for treating symptoms that are associated with menopause, such as hot flashes and night sweats. Hormone replacement carries certain risks, especially as you become older. If you are thinking about using estrogen or estrogen with progestin treatments, discuss the benefits and risks with your health care provider. WHAT SHOULD I KNOW ABOUT HEART DISEASE AND STROKE? Heart disease, heart attack, and stroke become more likely as you age. This may be due, in part, to the hormonal changes that your body experiences during menopause. These can affect how your body processes dietary fats, triglycerides, and cholesterol. Heart attack and stroke are both medical emergencies. There are  many things that you can do to help prevent heart disease and stroke:  Have your blood pressure checked at least every 1-2 years. High blood pressure causes heart disease and increases the risk of stroke.  If you are 57-51 years old, ask your health care provider if you should take aspirin to prevent a heart attack or a stroke.  Do not use any tobacco products, including cigarettes, chewing tobacco, or electronic cigarettes. If you need help quitting, ask your health care provider.  It is important to eat a healthy diet and maintain a healthy weight.  Be sure to include plenty of vegetables, fruits, low-fat dairy products, and lean protein.  Avoid eating foods that are high in solid fats, added sugars, or salt (sodium).  Get regular exercise. This is one of the most important things that you can do for your health.  Try to exercise for at least 150 minutes each week. The type of exercise that you do should increase your heart rate and make you sweat. This is known as moderate-intensity exercise.  Try to do strengthening exercises at least twice each week. Do these in addition to the moderate-intensity exercise.  Know your numbers.Ask your health care provider to check your cholesterol and your blood glucose. Continue to have your blood tested as directed by your health care provider. WHAT SHOULD I KNOW ABOUT CANCER SCREENING? There are several types of cancer. Take the following steps to reduce your risk and to catch any cancer development as early as possible. Breast Cancer  Practice breast self-awareness.  This means understanding how your breasts normally appear and feel.  It also means doing regular breast self-exams. Let your health care provider know about any  changes, no matter how small.  If you are 37 or older, have a clinician do a breast exam (clinical breast exam or CBE) every year. Depending on your age, family history, and medical history, it may be recommended that you  also have a yearly breast X-ray (mammogram).  If you have a family history of breast cancer, talk with your health care provider about genetic screening.  If you are at high risk for breast cancer, talk with your health care provider about having an MRI and a mammogram every year.  Breast cancer (BRCA) gene test is recommended for women who have family members with BRCA-related cancers. Results of the assessment will determine the need for genetic counseling and BRCA1 and for BRCA2 testing. BRCA-related cancers include these types:  Breast. This occurs in males or females.  Ovarian.  Tubal. This may also be called fallopian tube cancer.  Cancer of the abdominal or pelvic lining (peritoneal cancer).  Prostate.  Pancreatic. Cervical, Uterine, and Ovarian Cancer Your health care provider may recommend that you be screened regularly for cancer of the pelvic organs. These include your ovaries, uterus, and vagina. This screening involves a pelvic exam, which includes checking for microscopic changes to the surface of your cervix (Pap test).  For women ages 21-65, health care providers may recommend a pelvic exam and a Pap test every three years. For women ages 14-65, they may recommend the Pap test and pelvic exam, combined with testing for human papilloma virus (HPV), every five years. Some types of HPV increase your risk of cervical cancer. Testing for HPV may also be done on women of any age who have unclear Pap test results.  Other health care providers may not recommend any screening for nonpregnant women who are considered low risk for pelvic cancer and have no symptoms. Ask your health care provider if a screening pelvic exam is right for you.  If you have had past treatment for cervical cancer or a condition that could lead to cancer, you need Pap tests and screening for cancer for at least 20 years after your treatment. If Pap tests have been discontinued for you, your risk factors (such  as having a new sexual partner) need to be reassessed to determine if you should start having screenings again. Some women have medical problems that increase the chance of getting cervical cancer. In these cases, your health care provider may recommend that you have screening and Pap tests more often.  If you have a family history of uterine cancer or ovarian cancer, talk with your health care provider about genetic screening.  If you have vaginal bleeding after reaching menopause, tell your health care provider.  There are currently no reliable tests available to screen for ovarian cancer. Lung Cancer Lung cancer screening is recommended for adults 16-45 years old who are at high risk for lung cancer because of a history of smoking. A yearly low-dose CT scan of the lungs is recommended if you:  Currently smoke.  Have a history of at least 30 pack-years of smoking and you currently smoke or have quit within the past 15 years. A pack-year is smoking an average of one pack of cigarettes per day for one year. Yearly screening should:  Continue until it has been 15 years since you quit.  Stop if you develop a health problem that would prevent you from having lung cancer treatment. Colorectal Cancer  This type of cancer can be detected and can often be prevented.  Routine  colorectal cancer screening usually begins at age 16 and continues through age 24.  If you have risk factors for colon cancer, your health care provider may recommend that you be screened at an earlier age.  If you have a family history of colorectal cancer, talk with your health care provider about genetic screening.  Your health care provider may also recommend using home test kits to check for hidden blood in your stool.  A small camera at the end of a tube can be used to examine your colon directly (sigmoidoscopy or colonoscopy). This is done to check for the earliest forms of colorectal cancer.  Direct examination  of the colon should be repeated every 5-10 years until age 32. However, if early forms of precancerous polyps or small growths are found or if you have a family history or genetic risk for colorectal cancer, you may need to be screened more often. Skin Cancer  Check your skin from head to toe regularly.  Monitor any moles. Be sure to tell your health care provider:  About any new moles or changes in moles, especially if there is a change in a mole's shape or color.  If you have a mole that is larger than the size of a pencil eraser.  If any of your family members has a history of skin cancer, especially at a young age, talk with your health care provider about genetic screening.  Always use sunscreen. Apply sunscreen liberally and repeatedly throughout the day.  Whenever you are outside, protect yourself by wearing long sleeves, pants, a wide-brimmed hat, and sunglasses. WHAT SHOULD I KNOW ABOUT OSTEOPOROSIS? Osteoporosis is a condition in which bone destruction happens more quickly than new bone creation. After menopause, you may be at an increased risk for osteoporosis. To help prevent osteoporosis or the bone fractures that can happen because of osteoporosis, the following is recommended:  If you are 19-68 years old, get at least 1,000 mg of calcium and at least 600 mg of vitamin D per day.  If you are older than age 11 but younger than age 69, get at least 1,200 mg of calcium and at least 600 mg of vitamin D per day.  If you are older than age 61, get at least 1,200 mg of calcium and at least 800 mg of vitamin D per day. Smoking and excessive alcohol intake increase the risk of osteoporosis. Eat foods that are rich in calcium and vitamin D, and do weight-bearing exercises several times each week as directed by your health care provider. WHAT SHOULD I KNOW ABOUT HOW MENOPAUSE AFFECTS [place]? Depression may occur at any age, but it is more common as you become older. Common  symptoms of depression include:  Low or sad mood.  Changes in sleep patterns.  Changes in appetite or eating patterns.  Feeling an overall lack of motivation or enjoyment of activities that you previously enjoyed.  Frequent crying spells. Talk with your health care provider if you think that you are experiencing depression. WHAT SHOULD I KNOW ABOUT IMMUNIZATIONS? It is important that you get and maintain your immunizations. These include:  Tetanus, diphtheria, and pertussis (Tdap) booster vaccine.  Influenza every year before the flu season begins.  Pneumonia vaccine.  Shingles vaccine. Your health care provider may also recommend other immunizations.   This information is not intended to replace advice given to you by your health care provider. Make sure you discuss any questions you have with your health care provider.  Document Released: 11/08/2005 Document Revised: 10/07/2014 Document Reviewed: 05/19/2014 Elsevier Interactive Patient Education [employer].

## 2016-06-26 NOTE — Telephone Encounter (Signed)
Left detailed message w/results and CB number if questions on pt cell VM. See result note

## 2016-06-26 NOTE — Progress Notes (Signed)
Pre-visit discussion using our clinic review tool. No additional management support is needed unless otherwise documented below in the visit note.  

## 2016-06-26 NOTE — Progress Notes (Signed)
Patient ID: Jean Brooks, female    DOB: November 04, 1945  Age: 70 y.o. MRN: [id number]  The patient is here for annual  wellness examination and management of other chronic and acute problems.    Getting the flu vaccine at [facility]    The risk factors are reflected in the social history.  The roster of all physicians providing medical care to patient - is listed in the Snapshot section of the chart.  Home safety : The patient has smoke detectors in the home. They wear seatbelts.  There are no firearms at home. There is no violence in the home.   There is no risks for hepatitis, STDs or HIV. There is no   history of blood transfusion. They have no travel history to infectious disease endemic areas of the world.  The patient has seen their dentist in the last six month. They have seen their eye doctor in the last year.  Discussed the need for sun protection: hats, long sleeves and use of sunscreen if there is significant sun exposure.   Diet: the importance of a healthy diet is discussed. They do have a healthy diet.  The benefits of regular aerobic exercise were discussed. She walks 4 times per week ,  20 minutes.   Depression screen: there are no signs or vegative symptoms of depression- irritability, change in appetite, anhedonia, sadness/tearfullness.  Cognitive assessment: the patient manages all their financial and personal affairs and is actively engaged. They could relate day,date,year and events; recalled 2/3 objects at 3 minutes; performed clock-face test normally.  The following portions of the patient's history were reviewed and updated as appropriate: allergies, current medications, past family history, past medical history,  past surgical history, past social history  and problem list.  Visual acuity was not assessed per patient preference since she has regular follow up with her ophthalmologist. Hearing and body mass index were assessed and reviewed.   During the course of the  visit the patient was educated and counseled about appropriate screening and preventive services including : fall prevention , diabetes screening, nutrition counseling, colorectal cancer screening, and recommended immunizations.    CC: The primary encounter diagnosis was Encounter for preventive health examination. Diagnoses of Cyst of left ovary, Syncope and collapse, Other constipation, SVT (supraventricular tachycardia) ([place]), Hyperlipidemia, Vitamin D deficiency, and Screening for breast cancer were also pertinent to this visit.  Discussed MRI  And carotids done recently for evaluation after recurrent syncope.  Recurrent syncope:   cardiAC WORKUP WAS NORMAL  History Spenser has a past medical history of Chest pain, non-cardiac (2010); Diverticulitis of colon with hemorrhage (August 2009); H/O syncope; Migraine headache; Scoliosis of thoracic spine; and Shingles.   She has a past surgical history that includes Appendectomy (1963) and Abdominal hysterectomy (1991).   Her family history includes Aneurysm (age of onset: 61) in her father; COPD (age of onset: 75) in her father; COPD (age of onset: 71) in her mother; Heart attack (age of onset: 65) in her maternal grandfather; Hypertension in her mother; Mental illness in her father; Stroke in her maternal grandmother and mother; Stroke (age of onset: 47) in her son; Supraventricular tachycardia in her mother.She reports that she has never smoked. She has never used smokeless tobacco. She reports that she does not drink alcohol or use drugs.  Outpatient Medications Prior to Visit  Medication Sig Dispense Refill  . Cholecalciferol (VITAMIN D3) 1000 UNITS CAPS Take 1 capsule by mouth daily. Reported on 10/31/2015    .  Multiple Vitamin (MULTIVITAMIN) tablet Take 1 tablet by mouth daily.    . nitrofurantoin, macrocrystal-monohydrate, (MACROBID) 100 MG capsule Take 1 capsule (100 mg total) by mouth 2 (two) times daily. 14 capsule 0  . ondansetron (ZOFRAN)  4 MG tablet Take 1 tablet (4 mg total) by mouth every 8 (eight) hours as needed for nausea or vomiting. 20 tablet 0  . zolpidem (AMBIEN CR) 12.5 MG CR tablet TAKE 1 TABLET BY MOUTH NIGHTLY AT BEDTIME AS NEEDED FOR SLEEP 30 tablet 5  . traZODone (DESYREL) 50 MG tablet Take 0.5-1 tablets (25-50 mg total) by mouth at bedtime as needed for sleep. 30 tablet 3  . fluconazole (DIFLUCAN) 150 MG tablet Take 1 tablet (150 mg total) by mouth daily. (Patient not taking: Reported on 06/26/2016) 2 tablet 0   No facility-administered medications prior to visit.     Review of Systems   Patient denies headache, fevers, malaise, unintentional weight loss, skin rash, eye pain, sinus congestion and sinus pain, sore throat, dysphagia,  hemoptysis , cough, dyspnea, wheezing, chest pain, palpitations, orthopnea, edema, abdominal pain, nausea, melena, diarrhea, constipation, flank pain, dysuria, hematuria, urinary  Frequency, nocturia, numbness, tingling, seizures,  Focal weakness, Loss of consciousness,  Tremor, insomnia, depression, anxiety, and suicidal ideation.      Objective:  BP 104/70   Pulse 76   Temp 98.1 F (36.7 C) (Oral)   Resp 12   Ht 5\' 3"  (1.6 m)   Wt 111 lb 8 oz (50.6 kg)   SpO2 98%   BMI 19.75 kg/m   Physical Exam   General appearance: alert, cooperative and appears stated age Head: Normocephalic, without obvious abnormality, atraumatic Eyes: conjunctivae/corneas clear. PERRL, EOM's intact. Fundi benign. Ears: normal TM's and external ear canals both ears Nose: Nares normal. Septum midline. Mucosa normal. No drainage or sinus tenderness. Throat: lips, mucosa, and tongue normal; teeth and gums normal Neck: no adenopathy, no carotid bruit, no JVD, supple, symmetrical, trachea midline and thyroid not enlarged, symmetric, no tenderness/mass/nodules Lungs: clear to auscultation bilaterally Breasts: normal appearance, no masses or tenderness Heart: regular rate and rhythm, S1, S2 normal, no  murmur, click, rub or gallop Abdomen: soft, non-tender; bowel sounds normal; no masses,  no organomegaly Extremities: extremities normal, atraumatic, no cyanosis or edema Pulses: 2+ and symmetric Skin: Skin color, texture, turgor normal. No rashes or lesions Neurologic: Alert and oriented X 3, normal strength and tone. Normal symmetric reflexes. Normal coordination and gait.     Assessment & Plan:   Problem List Items Addressed This Visit    Encounter for preventive health examination - Primary    Annual comprehensive preventive exam was done as well as an evaluation and management of chronic conditions .  During the course of the visit the patient was educated and counseled about appropriate screening and preventive services including :  diabetes screening, lipid analysis with projected  10 year  risk for CAD , nutrition counseling, breast, cervical and colorectal cancer screening, and recommended immunizations.  Printed recommendations for health maintenance screenings was given      Hyperlipidemia    Mild, but given changes noted on recent MRI,  recommending treating with statin .       Relevant Orders   Lipid panel (Completed)   Cyst, ovarian    Left sided, evaluated by T Schermerhorn Sept 2016 and CA 125 was normal.  Recommended repeat US in 6 moths whic was not done. Will order now.       Relevant Orders  US Transvaginal Non-OB   US Pelvis Complete   Syncope and collapse   Relevant Orders   CBC with Differential/Platelet (Completed)   SVT (supraventricular tachycardia) (HCC)   Relevant Orders   TSH (Completed)   Constipation   Relevant Orders   Comprehensive metabolic panel (Completed)    Other Visit Diagnoses    Vitamin D deficiency       Relevant Orders   VITAMIN D 25 Hydroxy (Vit-D Deficiency, Fractures) (Completed)   Screening for breast cancer       Relevant Orders   MM Digital Screening      I have discontinued Ms. Valera's traZODone and fluconazole. I am  also having her maintain her multivitamin, Vitamin D3, ondansetron, zolpidem, and nitrofurantoin (macrocrystal-monohydrate).  No orders of the defined types were placed in this encounter.   Medications Discontinued During This Encounter  Medication Reason  . fluconazole (DIFLUCAN) 150 MG tablet   . traZODone (DESYREL) 50 MG tablet     Follow-up: No Follow-up on file.   Crecencio Mc, MD

## 2016-06-26 NOTE — Assessment & Plan Note (Signed)
Left sided, evaluated by T Schermerhorn Sept 2016 and CA 125 was normal.  Recommended repeat US in 6 moths whic was not done. Will order now.

## 2016-06-27 ENCOUNTER — Encounter: Payer: Self-pay | Admitting: Internal Medicine

## 2016-06-27 DIAGNOSIS — E782 Mixed hyperlipidemia: Secondary | ICD-10-CM | POA: Insufficient documentation

## 2016-06-27 NOTE — Assessment & Plan Note (Signed)
Annual comprehensive preventive exam was done as well as an evaluation and management of chronic conditions .  During the course of the visit the patient was educated and counseled about appropriate screening and preventive services including :  diabetes screening, lipid analysis with projected  10 year  risk for CAD , nutrition counseling, breast, cervical and colorectal cancer screening, and recommended immunizations.  Printed recommendations for health maintenance screenings was given 

## 2016-06-27 NOTE — Assessment & Plan Note (Signed)
Mild, but given changes noted on recent MRI,  recommending treating with statin .

## 2016-06-28 ENCOUNTER — Ambulatory Visit: Payer: 59

## 2016-07-17 ENCOUNTER — Ambulatory Visit
Admission: RE | Admit: 2016-07-17 | Discharge: 2016-07-17 | Disposition: A | Discharge status: Home / Self Care | Payer: 59 | Source: Ambulatory Visit | Attending: Internal Medicine | Admitting: Internal Medicine

## 2016-07-17 ENCOUNTER — Other Ambulatory Visit: Payer: Self-pay | Admitting: Internal Medicine

## 2016-07-17 DIAGNOSIS — Z1239 Encounter for other screening for malignant neoplasm of breast: Secondary | ICD-10-CM

## 2016-07-17 DIAGNOSIS — Z1231 Encounter for screening mammogram for malignant neoplasm of breast: Secondary | ICD-10-CM | POA: Diagnosis not present

## 2016-07-19 ENCOUNTER — Ambulatory Visit: Payer: 59

## 2016-08-06 ENCOUNTER — Ambulatory Visit: Payer: 59

## 2016-08-13 ENCOUNTER — Telehealth: Payer: Self-pay | Admitting: *Deleted

## 2016-08-13 MED ORDER — CIPROFLOXACIN HCL 250 MG PO TABS: 250.0000 mg | ORAL_TABLET | Freq: Every day | ORAL | 0 refills | Status: DC

## 2016-08-13 NOTE — Telephone Encounter (Signed)
Patient stated that she often have UTI's, she currently has symptoms of discomfort and requested to have medication called into her pharmacy  Pt contact [phone]

## 2016-08-13 NOTE — Telephone Encounter (Signed)
Left message to call on need more detail in regards to symptoms.  ? Schedule office visit

## 2016-08-13 NOTE — Telephone Encounter (Signed)
Please call pt on cell phone if time is after 1545 Contact [phone]

## 2016-08-13 NOTE — Telephone Encounter (Signed)
Medication called to pharmacy patient aware to take probiotic.

## 2016-08-13 NOTE — Telephone Encounter (Signed)
Called patient back states symptoms of burning during urination, and back pain discomfort have been going X2 days.  She took one dose of Cipro.

## 2016-08-13 NOTE — Telephone Encounter (Signed)
Pt symptoms are Burning during urination, pain and discomfort.  Please call [phone]

## 2016-08-13 NOTE — Telephone Encounter (Signed)
She might as wel fisnih a course of cipro since she started it,  UA will be useless at this point, if she does not have a full coures you can call in cipro 250 mg one tablet daily  c 5  days

## 2016-11-12 ENCOUNTER — Telehealth: Payer: Self-pay | Admitting: *Deleted

## 2016-11-12 DIAGNOSIS — R5383 Other fatigue: Secondary | ICD-10-CM

## 2016-11-12 DIAGNOSIS — E7800 Pure hypercholesterolemia, unspecified: Secondary | ICD-10-CM

## 2016-11-12 DIAGNOSIS — E559 Vitamin D deficiency, unspecified: Secondary | ICD-10-CM

## 2016-11-12 NOTE — Telephone Encounter (Signed)
Pt. Requested to have labs ordered, she was advised to have labs six months from er last draw . 06/26/16 Pt contact [phone]

## 2016-11-13 ENCOUNTER — Ambulatory Visit: Payer: Self-pay | Admitting: Family Medicine

## 2016-11-14 ENCOUNTER — Ambulatory Visit (INDEPENDENT_AMBULATORY_CARE_PROVIDER_SITE_OTHER): Payer: 59 | Admitting: Family

## 2016-11-14 ENCOUNTER — Encounter: Payer: Self-pay | Admitting: Family

## 2016-11-14 ENCOUNTER — Telehealth: Payer: Self-pay | Admitting: Family

## 2016-11-14 DIAGNOSIS — M545 Low back pain, unspecified: Secondary | ICD-10-CM

## 2016-11-14 DIAGNOSIS — R319 Hematuria, unspecified: Secondary | ICD-10-CM

## 2016-11-14 LAB — POCT URINALYSIS DIPSTICK
BILIRUBIN UA: NEGATIVE
GLUCOSE UA: NEGATIVE
Ketones, UA: NEGATIVE
Nitrite, UA: NEGATIVE
Protein, UA: NEGATIVE
SPEC GRAV UA: 1.01
Urobilinogen, UA: 1
pH, UA: 6

## 2016-11-14 MED ORDER — DICLOFENAC SODIUM 1 % TD GEL: 4.0000 g | Freq: Four times a day (QID) | TRANSDERMAL | 3 refills | Status: DC

## 2016-11-14 NOTE — Telephone Encounter (Signed)
Pt requested to be called at [phone]

## 2016-11-14 NOTE — Patient Instructions (Signed)
Trial voltaren gel  Suspect trochanteric bursitis and/or sacroiliac joint dysfunction   Trochanteric Bursitis Rehab Ask your health care provider which exercises are safe for you. Do exercises exactly as told by your health care provider and adjust them as directed. It is normal to feel mild stretching, pulling, tightness, or discomfort as you do these exercises, but you should stop right away if you feel sudden pain or your pain gets worse.Do not begin these exercises until told by your health care provider. Stretching exercises These exercises warm up your muscles and joints and improve the movement and flexibility of your hip. These exercises also help to relieve pain and stiffness. Exercise A: Iliotibial band stretch 1. Lie on your side with your left / right leg in the top position. 2. Bend your left / right knee and grab your ankle. 3. Slowly bring your knee back so your thigh is behind your body. 4. Slowly lower your knee toward the floor until you feel a gentle stretch on the outside of your left / right thigh. If you do not feel a stretch and your knee will not fall farther, place the heel of your other foot on top of your outer knee and pull your thigh down farther. 5. Hold this position for __________ seconds. 6. Slowly return to the starting position. Repeat __________ times. Complete this exercise __________ times a day. Strengthening exercises These exercises build strength and endurance in your hip and pelvis. Endurance is the ability to use your muscles for a long time, even after they get tired. Exercise B: Bridge (hip extensors) 1. Lie on your back on a firm surface with your knees bent and your feet flat on the floor. 2. Tighten your buttocks muscles and lift your buttocks off the floor until your trunk is level with your thighs. You should feel the muscles working in your buttocks and the back of your thighs. If this exercise is too easy, try doing it with your arms crossed  over your chest. 3. Hold this position for __________ seconds. 4. Slowly return to the starting position. 5. Let your muscles relax completely between repetitions. Repeat __________ times. Complete this exercise __________ times a day. Exercise C: Squats (knee extensors and  quadriceps) 1. Stand in front of a table, with your feet and knees pointing straight ahead. You may rest your hands on the table for balance but not for support. 2. Slowly bend your knees and lower your hips like you are going to sit in a chair.  Keep your weight over your heels, not over your toes.  Keep your lower legs upright so they are parallel with the table legs.  Do not let your hips go lower than your knees.  Do not bend lower than told by your health care provider.  If your hip pain increases, do not bend as low. 3. Hold this position for __________ seconds. 4. Slowly push with your legs to return to standing. Do not use your hands to pull yourself to standing. Repeat __________ times. Complete this exercise __________ times a day. Exercise D: Hip hike 1. Stand sideways on a bottom step. Stand on your left / right leg with your other foot unsupported next to the step. You can hold onto the railing or wall if needed for balance. 2. Keeping your knees straight and your torso square, lift your left / right hip up toward the ceiling. 3. Hold this position for __________ seconds. 4. Slowly let your left / right hip  lower toward the floor, past the starting position. Your foot should get closer to the floor. Do not lean or bend your knees. Repeat __________ times. Complete this exercise __________ times a day. Exercise E: Single leg stand 1. Stand near a counter or door frame that you can hold onto for balance as needed. It is helpful to stand in front of a mirror for this exercise so you can watch your hip. 2. Squeeze your left / right buttock muscles then lift up your other foot. Do not let your left / right  hip push out to the side. 3. Hold this position for __________ seconds. Repeat __________ times. Complete this exercise __________ times a day. This information is not intended to replace advice given to you by your health care provider. Make sure you discuss any questions you have with your health care provider. Document Released: 10/24/2004 Document Revised: 05/23/2016 Document Reviewed: 09/01/2015 Elsevier Interactive Patient Education  2017 [employer] Trochanteric Bursitis Trochanteric bursitis is a condition that causes hip pain. Trochanteric bursitis happens when fluid-filled sacs (bursae) in the hip get irritated. Normally these sacs absorb shock and help strong bands of tissue (tendons) in your hip glide smoothly over each other and over your hip bones. What are the causes? This condition results from increased friction between the hip bones and the tendons that go over them. This condition can happen if you:  Have weak hips.  Use your hip muscles too much (overuse).  Get hit in the hip. What increases the risk? This condition is more likely to develop in:  Women.  Adults who are middle-aged or older.  People with arthritis or a spinal condition.  People with weak buttocks muscles (gluteal muscles).  People who have one leg that is shorter than the other.  People who participate in certain kinds of athletic activities, such as:  Running sports, especially long-distance running.  Contact sports, like football or martial arts.  Sports in which falls may occur, like skiing. What are the signs or symptoms? The main symptom of this condition is pain and tenderness over the point of your hip. The pain may be:  Sharp and intense.  Dull and achy.  Felt on the outside of your thigh. It may increase when you:  Lie on your side.  Walk or run.  Go up on stairs.  Sit.  Stand up after sitting.  Stand for long periods of time. How is this diagnosed? This  condition may be diagnosed based on:  Your symptoms.  Your medical history.  A physical exam.  Imaging tests, such as:  X-rays to check your bones.  An MRI or ultrasound to check your tendons and muscles. During your physical exam, your health care provider will check the movement and strength of your hip. He or she may press on the point of your hip to check for pain. How is this treated? This condition may be treated by:  Resting.  Reducing your activity.  Avoiding activities that cause pain.  Using crutches, a cane, or a walker to decrease the strain on your hip.  Taking medicine to help with swelling.  Having medicine injected into the bursae to help with swelling.  Using ice, heat, and massage therapy for pain relief.  Physical therapy exercises for strength and flexibility.  Surgery (rare). Follow these instructions at home: Activity  Rest.  Avoid activities that cause pain.  Return to your normal activities as told by your health care provider. Ask your health  care provider what activities are safe for you. Managing pain, stiffness, and swelling  Take over-the-counter and prescription medicines only as told by your health care provider.  If directed, apply heat to the injured area as told by your health care provider.  Place a towel between your skin and the heat source.  Leave the heat on for 20-30 minutes.  Remove the heat if your skin turns bright red. This is especially important if you are unable to feel pain, heat, or cold. You may have a greater risk of getting burned.  If directed, apply ice to the injured area:  Put ice in a plastic bag.  Place a towel between your skin and the bag.  Leave the ice on for 20 minutes, 2-3 times a day. General instructions  If the affected leg is one that you use for driving, ask your health care provider when it is safe to drive.  Use crutches, a cane, or a walker as told by your health care  provider.  If one of your legs is shorter than the other, get fitted for a shoe insert.  Lose weight if you are overweight. How is this prevented?  Wear supportive footwear that is appropriate for your sport.  If you have hip pain, start any new exercise or sport slowly.  Maintain physical fitness, including:  Strength.  Flexibility. Contact a health care provider if:  Your pain does not improve with 2-4 weeks. Get help right away if:  You develop severe pain.  You have a fever.  You develop increased redness over your hip.  You have a change in your bowel function or bladder function.  You cannot control the muscles in your feet. This information is not intended to replace advice given to you by your health care provider. Make sure you discuss any questions you have with your health care provider. Document Released: 10/24/2004 Document Revised: 05/22/2016 Document Reviewed: 09/01/2015 Elsevier Interactive Patient Education  2017 [place].   Sacroiliac Joint Dysfunction Introduction Sacroiliac joint dysfunction is a condition that causes inflammation on one or both sides of the sacroiliac (SI) joint. The SI joint connects the lower part of the spine (sacrum) with the two upper portions of the pelvis (ilium). This condition causes deep aching or burning pain in the low back. In some cases, the pain may also spread into one or both buttocks or hips or spread down the legs. What are the causes? This condition may be caused by:  Pregnancy. During pregnancy, extra stress is put on the SI joints because the pelvis widens.  Injury, such as:  Car accidents.  Sport-related injuries.  Work-related injuries.  Having one leg that is shorter than the other.  Conditions that affect the joints, such as:  Rheumatoid arthritis.  Gout.  Psoriatic arthritis.  Joint infection (septic arthritis). Sometimes, the cause of SI joint dysfunction is not known. What are the  signs or symptoms? Symptoms of this condition include:  Aching or burning pain in the lower back. The pain may also spread to other areas, such as:  Buttocks.  Groin.  Thighs and legs.  Muscle spasms in or around the painful areas.  Increased pain when standing, walking, running, stair climbing, bending, or lifting. How is this diagnosed? Your health care provider will do a physical exam and take your medical history. During the exam, the health care provider may move one or both of your legs to different positions to check for pain. Various tests may be  done to help verify the diagnosis, including:  Imaging tests to look for other causes of pain. These may include:  MRI.  CT scan.  Bone scan.  Diagnostic injection. A numbing medicine is injected into the SI joint using a needle. If the pain is temporarily improved or stopped after the injection, this can indicate that SI joint dysfunction is the problem. How is this treated? Treatment may vary depending on the cause and severity of your condition. Treatment options may include:  Applying ice or heat to the lower back area. This can help to reduce pain and muscle spasms.  Medicines to relieve pain or inflammation or to relax the muscles.  Wearing a back brace (sacroiliac brace) to help support the joint while your back is healing.  Physical therapy to increase muscle strength around the joint and flexibility at the joint. This may also involve learning proper body positions and ways of moving to relieve stress on the joint.  Direct manipulation of the SI joint.  Injections of steroid medicine into the joint in order to reduce pain and swelling.  Radiofrequency ablation to burn away nerves that are carrying pain messages from the joint.  Use of a device that provides electrical stimulation in order to reduce pain at the joint.  Surgery to put in screws and plates that limit or prevent joint motion. This is rare. Follow  these instructions at home:  Rest as needed. Limit your activities as directed by your health care provider.  Take medicines only as directed by your health care provider.  If directed, apply ice to the affected area:  Put ice in a plastic bag.  Place a towel between your skin and the bag.  Leave the ice on for 20 minutes, 2-3 times per day.  Use a heating pad or a moist heat pack as directed by your health care provider.  Exercise as directed by your health care provider or physical therapist.  Keep all follow-up visits as directed by your health care provider. This is important. Contact a health care provider if:  Your pain is not controlled with medicine.  You have a fever.  You have increasingly severe pain. Get help right away if:  You have weakness, numbness, or tingling in your legs or feet.  You lose control of your bladder or bowel. This information is not intended to replace advice given to you by your health care provider. Make sure you discuss any questions you have with your health care provider. Document Released: 12/13/2008 Document Revised: 02/22/2016 Document Reviewed: 05/24/2014  2017 Elsevier

## 2016-11-14 NOTE — Telephone Encounter (Signed)
See prev note

## 2016-11-14 NOTE — Telephone Encounter (Signed)
Message has been left for patient to return call back.

## 2016-11-14 NOTE — Telephone Encounter (Signed)
End of march is 6 months!! Labs ordered

## 2016-11-14 NOTE — Addendum Note (Signed)
Addended by: Arby Barrette on: 11/14/2016 11:58 AM   Modules accepted: Orders

## 2016-11-14 NOTE — Telephone Encounter (Signed)
Call pt  Needs UA and UC

## 2016-11-14 NOTE — Addendum Note (Signed)
Addended by: Arby Barrette on: 11/14/2016 12:00 PM   Modules accepted: Orders

## 2016-11-14 NOTE — Progress Notes (Signed)
Subjective:    Patient ID: Jean Brooks, female    DOB: 06-22-1946, 71 y.o.   MRN: [id number]  CC: [name] is a 71 y.o. female who presents today for an acute visit.    HPI: CC: right low back pain 2-3 weeks ago, slightly worsening.  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 Improves with movement. Tried heat and NSAID 'once in while.' No numbness or tingling. No falls or cancer history.   No dysuria. Endorses frequency off and on for one week. No flank pain, fever.  However notes frequent UTI.    H/o GIB        HISTORY:  Past Medical History:  Diagnosis Date  . Chest pain, non-cardiac 2010   a. stress echo 2010: nl treadmill EKG w/o evidence of ischemia or arrhythmia, good exercise tolerance for age, normal stress echo images w/o evidence of myocardial ischemia; b. baseline echo with EF 55% peak stress showed nl LV systolic function wtih EF 65% w/o evidence of HK  . Diverticulitis of colon with hemorrhage August 2009   Hospitalized [facility]  . H/O syncope    a. echo 2010: EF 60%, LV nl size, RV nl size & fxn, atria nl size, aorta nl, no pericardial effusion, aortic valve nl, mitral valve nl w/ mild insufficiency, tricuspid valve nl w/ mild insufficiency, pulmonic valve nl  . Migraine headache   . Scoliosis of thoracic spine   . Shingles    Past Surgical History:  Procedure Laterality Date  . ABDOMINAL HYSTERECTOMY  1991  . APPENDECTOMY  1963   Family History  Problem Relation Age of Onset  . COPD Mother 38    respiratory failure/fibrosis, seizures  . Stroke Mother   . Hypertension Mother   . Supraventricular tachycardia Mother   . COPD Father 81  . Mental illness Father     Parkinson's Dementia  . Aneurysm Father 50  . Stroke Son 28    hypertensive, left brain   . Heart attack Maternal Grandfather 55    passed  . Stroke Maternal Grandmother   . Breast cancer Maternal Aunt     Allergies: Sulfa antibiotics; Erythromycin; Flagyl  [metronidazole]; Nitrofurantoin monohyd macro; Penicillins; Azithromycin; Ciprofloxacin; and Ibuprofen Current Outpatient Prescriptions on File Prior to Visit  Medication Sig Dispense Refill  . Cholecalciferol (VITAMIN D3) 1000 UNITS CAPS Take 1 capsule by mouth daily. Reported on 10/31/2015    . ciprofloxacin (CIPRO) 250 MG tablet Take 1 tablet (250 mg total) by mouth daily with breakfast. 5 tablet 0  . Multiple Vitamin (MULTIVITAMIN) tablet Take 1 tablet by mouth daily.    . nitrofurantoin, macrocrystal-monohydrate, (MACROBID) 100 MG capsule Take 1 capsule (100 mg total) by mouth 2 (two) times daily. 14 capsule 0  . ondansetron (ZOFRAN) 4 MG tablet Take 1 tablet (4 mg total) by mouth every 8 (eight) hours as needed for nausea or vomiting. 20 tablet 0  . zolpidem (AMBIEN CR) 12.5 MG CR tablet TAKE 1 TABLET BY MOUTH NIGHTLY AT BEDTIME AS NEEDED FOR SLEEP 30 tablet 5   No current facility-administered medications on file prior to visit.     Social History  Substance Use Topics  . Smoking status: Never Smoker  . Smokeless tobacco: Never Used  . Alcohol use No    Review of Systems  Constitutional: Negative for chills and fever.  Respiratory: Negative for cough.   Cardiovascular: Negative for chest pain and palpitations.  Gastrointestinal: Negative for nausea and vomiting.  Genitourinary: Negative for dysuria.  Musculoskeletal: Positive for back pain.      Objective:    BP 116/62   Pulse 68   Temp 98 F (36.7 C) (Oral)   Ht 5\' 3"  (1.6 m)   Wt 114 lb 3.2 oz (51.8 kg)   SpO2 97%   BMI 20.23 kg/m    Physical Exam  Constitutional: She appears well-developed and well-nourished.  Eyes: Conjunctivae are normal.  Cardiovascular: Normal rate, regular rhythm, normal heart sounds and normal pulses.   Pulmonary/Chest: Effort normal and breath sounds normal. She has no wheezes. She has no rhonchi. She has no rales.  Abdominal: There is no CVA tenderness.  Musculoskeletal:       Lumbar  back: She exhibits normal range of motion, no tenderness, no bony tenderness, no swelling, no edema, no pain and no spasm.       Back:  Full range of motion with flexion, tension, lateral side bends. No bony tenderness. Localized tenderness over right SI joint and right trochanter. No pain, numbness, tingling elicited with single leg raise bilaterally.   Neurological: She is alert. She has normal strength. No sensory deficit.  Reflex Scores:      Patellar reflexes are 2+ on the right side and 2+ on the left side. Sensation and strength intact bilateral lower extremities.  Skin: Skin is warm and dry.  Psychiatric: She has a normal mood and affect. Her speech is normal and behavior is normal. Thought content normal.  Vitals reviewed.      Assessment & Plan:    1. Acute right-sided low back pain without sciatica Symptoms support working diagnosis of right SI joint dysfunction with associated trochanteric bursitis of the right side. Trial voltaren gel due to h/o GIB ( no PO). Urinalysis positive for hematuria. Pending urine culture - POCT urinalysis dipstick - diclofenac sodium (VOLTAREN) 1 % GEL; Apply 4 g topically 4 (four) times daily.  Dispense: 1 Tube; Refill: 3 - CULTURE, URINE COMPREHENSIVE   I am having Ms. Runco start on diclofenac sodium. I am also having her maintain her multivitamin, Vitamin D3, ondansetron, zolpidem, nitrofurantoin (macrocrystal-monohydrate), and ciprofloxacin.   Meds ordered this encounter  Medications  . diclofenac sodium (VOLTAREN) 1 % GEL    Sig: Apply 4 g topically 4 (four) times daily.    Dispense:  1 Tube    Refill:  3    Order Specific Question:   Supervising Provider    Answer:   Crecencio Mc [[id number]]    Return precautions given.   Risks, benefits, and alternatives of the medications and treatment plan prescribed today were discussed, and patient expressed understanding.   Education regarding symptom management and diagnosis given to  patient on AVS.  Continue to follow with TULLO, Aris Everts, MD for routine health maintenance.   Donella Stade and I agreed with plan.   Mable Paris, FNP

## 2016-11-15 NOTE — Telephone Encounter (Signed)
Lm on pt's vm to call and make a lab appt in March.

## 2016-11-15 NOTE — Telephone Encounter (Signed)
Pt labs have been ordered from Dr. Derrel Nip and needs to be scheduled at the end of March. Could you call and schedule pt?

## 2016-11-15 NOTE — Telephone Encounter (Signed)
Please see second note down about scheduling pt, rerouted to you.

## 2016-11-15 NOTE — Telephone Encounter (Signed)
Left message for patient to return call back.  

## 2016-11-15 NOTE — Telephone Encounter (Signed)
Labs ordered.  Why am I still getting messages? I don't understand

## 2016-11-15 NOTE — Telephone Encounter (Signed)
Disregard prev note need to be rerouted

## 2016-11-19 ENCOUNTER — Encounter: Payer: Self-pay | Admitting: Family

## 2016-11-19 NOTE — Telephone Encounter (Signed)
Left message for patient to return call back.  

## 2016-11-20 ENCOUNTER — Other Ambulatory Visit (INDEPENDENT_AMBULATORY_CARE_PROVIDER_SITE_OTHER): Payer: 59

## 2016-11-20 DIAGNOSIS — M545 Low back pain, unspecified: Secondary | ICD-10-CM

## 2016-11-20 DIAGNOSIS — R319 Hematuria, unspecified: Secondary | ICD-10-CM

## 2016-11-21 LAB — URINALYSIS, ROUTINE W REFLEX MICROSCOPIC
Bilirubin Urine: NEGATIVE
Hgb urine dipstick: NEGATIVE
Ketones, ur: NEGATIVE
Nitrite: NEGATIVE
RBC / HPF: NONE SEEN (ref 0–?)
Specific Gravity, Urine: <=1.005 — AB (ref 1.000–1.030)
Total Protein, Urine: NEGATIVE
URINE GLUCOSE: NEGATIVE
Urobilinogen, UA: 0.2 (ref 0.0–1.0)
pH: 6.5 (ref 5.0–8.0)

## 2016-11-21 LAB — URINE CULTURE: ORGANISM ID, BACTERIA: NO GROWTH

## 2016-11-21 NOTE — Telephone Encounter (Signed)
Patient has completed Labs. Awaiting results.

## 2016-11-25 ENCOUNTER — Other Ambulatory Visit: Payer: Self-pay | Admitting: Internal Medicine

## 2016-11-25 NOTE — Telephone Encounter (Signed)
Refilled on 05/21/2016 with 5 refills. Last office visit 06/26/2016. Next office visit 06/27/2017. Please advise.

## 2016-11-25 NOTE — Telephone Encounter (Signed)
6 month follow up required for ambien refills.  Refill for 30 days only.  OFFICE VISIT NEEDED prior to any more refills

## 2016-11-26 NOTE — Telephone Encounter (Signed)
Signed Rx faxed to pharmacy. Left mess for patient to call back to call us back to schedule OV per PCP.

## 2016-12-02 NOTE — Telephone Encounter (Signed)
LMTCB

## 2016-12-05 NOTE — Telephone Encounter (Signed)
Spoke with pt and she stated that she has already picked up the rx and scheduled an appt to see Dr. Derrel Nip on 01/01/2017.

## 2016-12-24 ENCOUNTER — Other Ambulatory Visit: Payer: Self-pay | Admitting: Internal Medicine

## 2016-12-25 DIAGNOSIS — E782 Mixed hyperlipidemia: Secondary | ICD-10-CM | POA: Diagnosis not present

## 2016-12-25 DIAGNOSIS — R55 Syncope and collapse: Secondary | ICD-10-CM | POA: Diagnosis not present

## 2016-12-25 NOTE — Telephone Encounter (Signed)
Printed signed and faxed

## 2016-12-25 NOTE — Telephone Encounter (Signed)
Refilled

## 2016-12-25 NOTE — Telephone Encounter (Signed)
Refilled 11/25/2016 Last OV: 11/14/2016 Next OV: 01/01/2017

## 2017-01-01 ENCOUNTER — Encounter: Payer: Self-pay | Admitting: Internal Medicine

## 2017-01-01 ENCOUNTER — Ambulatory Visit (INDEPENDENT_AMBULATORY_CARE_PROVIDER_SITE_OTHER): Payer: 59 | Admitting: Internal Medicine

## 2017-01-01 DIAGNOSIS — M4802 Spinal stenosis, cervical region: Secondary | ICD-10-CM

## 2017-01-01 DIAGNOSIS — E7800 Pure hypercholesterolemia, unspecified: Secondary | ICD-10-CM | POA: Diagnosis not present

## 2017-01-01 DIAGNOSIS — R5383 Other fatigue: Secondary | ICD-10-CM

## 2017-01-01 DIAGNOSIS — E559 Vitamin D deficiency, unspecified: Secondary | ICD-10-CM | POA: Diagnosis not present

## 2017-01-01 DIAGNOSIS — G4700 Insomnia, unspecified: Secondary | ICD-10-CM | POA: Diagnosis not present

## 2017-01-01 NOTE — Patient Instructions (Signed)
Good to see you!  The ShingRx vaccine is 91% effective in preventing a shingles outbreak  It requires to injections  So don't forget to return for your second dose

## 2017-01-01 NOTE — Progress Notes (Signed)
Subjective:  Patient ID: Jean Brooks, female    DOB: 03/29/1946  Age: 71 y.o. MRN: [id number]  CC: Diagnoses of Fatigue, unspecified type, Pure hypercholesterolemia, Vitamin D deficiency, Insomnia, persistent, and Stenosis of cervical spine were pertinent to this visit.  HPI [name] presents for follow up on chronic insomnia, orthostatic hypotension with presyncope.   Endocrinology referral was made due to an abnormally low cortisol level, but patient apparently never met with dr Loanne Drilling.  Repeat cortisol level was normal. She had cardiology follow up with [facility])  3/28. No further workup was done given the workup that was done prior to visit. she feels generally well,  Except for mild fatigue , and denies any recurrence of symptoms that led to syncope.  MRI/MRA and carotid dopplers were non diagnostic. ,  history of shingles   Wants  shingrx prescription for workplace administration    Chronic insomnia managed with ambien .  No success with previous trials of trazodone   Outpatient Medications Prior to Visit  Medication Sig Dispense Refill  . Cholecalciferol (VITAMIN D3) 1000 UNITS CAPS Take 1 capsule by mouth daily. Reported on 10/31/2015    . Multiple Vitamin (MULTIVITAMIN) tablet Take 1 tablet by mouth daily.    Marland Kitchen zolpidem (AMBIEN CR) 12.5 MG CR tablet TAKE 1 TABLET BY MOUTH AT BEDTIME AS NEEDED FOR SLEEP 30 tablet 5  . ondansetron (ZOFRAN) 4 MG tablet Take 1 tablet (4 mg total) by mouth every 8 (eight) hours as needed for nausea or vomiting. 20 tablet 0  . ciprofloxacin (CIPRO) 250 MG tablet Take 1 tablet (250 mg total) by mouth daily with breakfast. (Patient not taking: Reported on 01/01/2017) 5 tablet 0  . diclofenac sodium (VOLTAREN) 1 % GEL Apply 4 g topically 4 (four) times daily. (Patient not taking: Reported on 01/01/2017) 1 Tube 3  . nitrofurantoin, macrocrystal-monohydrate, (MACROBID) 100 MG capsule Take 1 capsule (100 mg total) by mouth 2 (two) times daily. (Patient not  taking: Reported on 01/01/2017) 14 capsule 0   No facility-administered medications prior to visit.     Review of Systems;  Patient denies headache, fevers, malaise, unintentional weight loss, skin rash, eye pain, sinus congestion and sinus pain, sore throat, dysphagia,  hemoptysis , cough, dyspnea, wheezing, chest pain, palpitations, orthopnea, edema, abdominal pain, nausea, melena, diarrhea, constipation, flank pain, dysuria, hematuria, urinary  Frequency, nocturia, numbness, tingling, seizures,  Focal weakness, Loss of consciousness,  Tremor, insomnia, depression, anxiety, and suicidal ideation.      Objective:  BP (!) 106/58   Pulse 75   Resp 16   Ht '5\' 3"'  (1.6 m)   Wt 113 lb 12.8 oz (51.6 kg)   SpO2 97%   BMI 20.16 kg/m   BP Readings from Last 3 Encounters:  01/01/17 (!) 106/58  11/14/16 116/62  06/26/16 104/70    Wt Readings from Last 3 Encounters:  01/01/17 113 lb 12.8 oz (51.6 kg)  11/14/16 114 lb 3.2 oz (51.8 kg)  06/26/16 111 lb 8 oz (50.6 kg)    General appearance: alert, cooperative and appears stated age Neck: no adenopathy, no carotid bruit, supple, symmetrical, trachea midline and thyroid not enlarged, symmetric, no tenderness/mass/nodules Back: symmetric, no curvature. ROM normal. No CVA tenderness. Lungs: clear to auscultation bilaterally Heart: regular rate and rhythm, S1, S2 normal, no murmur, click, rub or gallop Abdomen: soft, non-tender; bowel sounds normal; no masses,  no organomegaly Pulses: 2+ and symmetric Skin: Skin color, texture, turgor normal. No rashes or lesions  Lymph nodes: Cervical, supraclavicular, and axillary nodes normal.  Lab Results  Component Value Date   HGBA1C 5.7 05/21/2016    Lab Results  Component Value Date   CREATININE 0.78 01/01/2017   CREATININE 0.74 06/26/2016   CREATININE 0.71 05/21/2016    Lab Results  Component Value Date   WBC 6.8 01/01/2017   HGB 13.0 01/01/2017   HCT 38.6 01/01/2017   PLT 248.0  01/01/2017   GLUCOSE 105 (H) 01/01/2017   CHOL 194 01/01/2017   TRIG 71.0 01/01/2017   HDL 64.10 01/01/2017   LDLCALC 116 (H) 01/01/2017   ALT 13 01/01/2017   AST 15 01/01/2017   NA 139 01/01/2017   K 4.1 01/01/2017   CL 103 01/01/2017   CREATININE 0.78 01/01/2017   BUN 18 01/01/2017   CO2 31 01/01/2017   TSH 1.51 01/01/2017   HGBA1C 5.7 05/21/2016    Mm Screening Breast Tomo Bilateral  Result Date: 07/18/2016 CLINICAL DATA:  Screening. EXAM: 2D DIGITAL SCREENING BILATERAL MAMMOGRAM WITH CAD AND ADJUNCT TOMO COMPARISON:  Previous exam(s). ACR Breast Density Category c: The breast tissue is heterogeneously dense, which may obscure small masses. FINDINGS: There are no findings suspicious for malignancy. Images were processed with CAD. IMPRESSION: No mammographic evidence of malignancy. A result letter of this screening mammogram will be mailed directly to the patient. RECOMMENDATION: Screening mammogram in one year. (Code:SM-B-01Y) BI-RADS CATEGORY  1: Negative. Electronically Signed   By: Pamelia Hoit M.D.   On: 07/18/2016 09:38    Assessment & Plan:   Problem List Items Addressed This Visit    Hyperlipidemia    Mild, but given changes noted on recent MRI,  Recommended treating with statin, which she has deferred .       Insomnia, persistent    She did not tolerate trial of trazodone 1.5 hours before bedtime due ot excessive morning sedation. The risks and benefits of hypnotics in the elderly were discussed with patient today including excessive sedation leading to respiratory depression,  impaired thinking/driving, and addiction.  Patient was advised to avoid concurrent use with alcohol, to use medication only as needed and not to share with others  .  continue ambien cr      Stenosis of cervical spine    Incidental finding on MRI.There appears to be moderate stenosis at C4-5, incompletely evaluated, with suspected cord compression.   Patient denies neck pain or radiculopathy        Other Visit Diagnoses    Fatigue, unspecified type       Vitamin D deficiency          I have discontinued Ms. Osentoski's ondansetron, nitrofurantoin (macrocrystal-monohydrate), ciprofloxacin, and diclofenac sodium. I am also having her maintain her multivitamin, Vitamin D3, zolpidem, and MULTI-VITAMINS.  Meds ordered this encounter  Medications  . Multiple Vitamin (MULTI-VITAMINS) TABS    Sig: Take by mouth.    Medications Discontinued During This Encounter  Medication Reason  . ciprofloxacin (CIPRO) 250 MG tablet Therapy completed  . diclofenac sodium (VOLTAREN) 1 % GEL Patient has not taken in last 30 days  . nitrofurantoin, macrocrystal-monohydrate, (MACROBID) 100 MG capsule Therapy completed  . ondansetron (ZOFRAN) 4 MG tablet     Follow-up: No Follow-up on file.   Crecencio Mc, MD

## 2017-01-01 NOTE — Progress Notes (Signed)
Pre visit review using our clinic review tool, if applicable. No additional management support is needed unless otherwise documented below in the visit note. 

## 2017-01-02 DIAGNOSIS — M4802 Spinal stenosis, cervical region: Secondary | ICD-10-CM | POA: Insufficient documentation

## 2017-01-02 LAB — CBC WITH DIFFERENTIAL/PLATELET
BASOS ABS: 0.1 10*3/uL (ref 0.0–0.1)
Basophils Relative: 1.4 % (ref 0.0–3.0)
EOS ABS: 0.2 10*3/uL (ref 0.0–0.7)
Eosinophils Relative: 3.1 % (ref 0.0–5.0)
HCT: 38.6 % (ref 36.0–46.0)
Hemoglobin: 13 g/dL (ref 12.0–15.0)
Lymphocytes Relative: 28 % (ref 12.0–46.0)
Lymphs Abs: 1.9 10*3/uL (ref 0.7–4.0)
MCHC: 33.6 g/dL (ref 30.0–36.0)
MCV: 89.7 fl (ref 78.0–100.0)
MONOS PCT: 8.6 % (ref 3.0–12.0)
Monocytes Absolute: 0.6 10*3/uL (ref 0.1–1.0)
NEUTROS ABS: 4 10*3/uL (ref 1.4–7.7)
NEUTROS PCT: 58.9 % (ref 43.0–77.0)
Platelets: 248 10*3/uL (ref 150.0–400.0)
RBC: 4.3 Mil/uL (ref 3.87–5.11)
RDW: 13.4 % (ref 11.5–15.5)
WBC: 6.8 10*3/uL (ref 4.0–10.5)

## 2017-01-02 LAB — COMPREHENSIVE METABOLIC PANEL
ALK PHOS: 60 U/L (ref 39–117)
ALT: 13 U/L (ref 0–35)
AST: 15 U/L (ref 0–37)
Albumin: 4.1 g/dL (ref 3.5–5.2)
BUN: 18 mg/dL (ref 6–23)
CO2: 31 meq/L (ref 19–32)
Calcium: 9.5 mg/dL (ref 8.4–10.5)
Chloride: 103 mEq/L (ref 96–112)
Creatinine, Ser: 0.78 mg/dL (ref 0.40–1.20)
GFR: 77.71 mL/min (ref >60.00)
GLUCOSE: 105 mg/dL — AB (ref 70–99)
POTASSIUM: 4.1 meq/L (ref 3.5–5.1)
SODIUM: 139 meq/L (ref 135–145)
Total Bilirubin: 0.3 mg/dL (ref 0.2–1.2)
Total Protein: 6.6 g/dL (ref 6.0–8.3)

## 2017-01-02 LAB — VITAMIN D 25 HYDROXY (VIT D DEFICIENCY, FRACTURES): VITD: 27.95 ng/mL — ABNORMAL LOW (ref 30.00–100.00)

## 2017-01-02 LAB — LIPID PANEL
CHOL/HDL RATIO: 3
Cholesterol: 194 mg/dL (ref 0–200)
HDL: 64.1 mg/dL (ref >39.00)
LDL CALC: 116 mg/dL — AB (ref 0–99)
NONHDL: 130.1
Triglycerides: 71 mg/dL (ref 0.0–149.0)
VLDL: 14.2 mg/dL (ref 0.0–40.0)

## 2017-01-02 LAB — TSH: TSH: 1.51 u[IU]/mL (ref 0.35–4.50)

## 2017-01-02 NOTE — Assessment & Plan Note (Signed)
She did not tolerate trial of trazodone 1.5 hours before bedtime due ot excessive morning sedation. The risks and benefits of hypnotics in the elderly were discussed with patient today including excessive sedation leading to respiratory depression,  impaired thinking/driving, and addiction.  Patient was advised to avoid concurrent use with alcohol, to use medication only as needed and not to share with others  .  continue [place] cr

## 2017-01-02 NOTE — Assessment & Plan Note (Addendum)
Incidental finding on MRI.There appears to be moderate stenosis at C4-5, incompletely evaluated, with suspected cord compression.   Patient denies neck pain or radiculopathy

## 2017-01-02 NOTE — Assessment & Plan Note (Signed)
Mild, but given changes noted on recent MRI,  Recommended treating with statin, which she has deferred .

## 2017-01-05 ENCOUNTER — Encounter: Payer: Self-pay | Admitting: Internal Medicine

## 2017-03-14 DIAGNOSIS — J301 Allergic rhinitis due to pollen: Secondary | ICD-10-CM | POA: Diagnosis not present

## 2017-03-14 DIAGNOSIS — H6123 Impacted cerumen, bilateral: Secondary | ICD-10-CM | POA: Diagnosis not present

## 2017-03-14 DIAGNOSIS — J0100 Acute maxillary sinusitis, unspecified: Secondary | ICD-10-CM | POA: Diagnosis not present

## 2017-05-16 ENCOUNTER — Ambulatory Visit (INDEPENDENT_AMBULATORY_CARE_PROVIDER_SITE_OTHER): Payer: 59 | Admitting: Vascular Surgery

## 2017-05-16 ENCOUNTER — Encounter (INDEPENDENT_AMBULATORY_CARE_PROVIDER_SITE_OTHER): Payer: Self-pay | Admitting: Vascular Surgery

## 2017-05-16 VITALS — BP 123/66 | HR 67 | Resp 16 | Ht 62.0 in | Wt 114.4 lb

## 2017-05-16 DIAGNOSIS — R55 Syncope and collapse: Secondary | ICD-10-CM | POA: Diagnosis not present

## 2017-05-16 DIAGNOSIS — M419 Scoliosis, unspecified: Secondary | ICD-10-CM

## 2017-05-16 DIAGNOSIS — E7800 Pure hypercholesterolemia, unspecified: Secondary | ICD-10-CM

## 2017-05-16 DIAGNOSIS — I83813 Varicose veins of bilateral lower extremities with pain: Secondary | ICD-10-CM

## 2017-05-16 NOTE — Assessment & Plan Note (Signed)
lipid control important in reducing the progression of atherosclerotic disease.   

## 2017-05-16 NOTE — Assessment & Plan Note (Signed)
See below

## 2017-05-16 NOTE — Assessment & Plan Note (Signed)
Previous workup. No recent symptoms

## 2017-05-16 NOTE — Patient Instructions (Signed)
Varicose Veins Varicose veins are veins that have become enlarged and twisted. They are usually seen in the legs but can occur in other parts of the body as well. What are the causes? This condition is the result of valves in the veins not working properly. Valves in the veins help to return blood from the leg to the heart. If these valves are damaged, blood flows backward and backs up into the veins in the leg near the skin. This causes the veins to become larger. What increases the risk? People who are on their feet a lot, who are pregnant, or who are overweight are more likely to develop varicose veins. What are the signs or symptoms?  Bulging, twisted-appearing, bluish veins, most commonly found on the legs.  Leg pain or a feeling of heaviness. These symptoms may be worse at the end of the day.  Leg swelling.  Changes in skin color. How is this diagnosed? A health care provider can usually diagnose varicose veins by examining your legs. Your health care provider may also recommend an ultrasound of your leg veins. How is this treated? Most varicose veins can be treated at home.However, other treatments are available for people who have persistent symptoms or want to improve the cosmetic appearance of the varicose veins. These treatment options include:  Sclerotherapy. A solution is injected into the vein to close it off.  Laser treatment. A laser is used to heat the vein to close it off.  Radiofrequency vein ablation. An electrical current produced by radio waves is used to close off the vein.  Phlebectomy. The vein is surgically removed through small incisions made over the varicose vein.  Vein ligation and stripping. The vein is surgically removed through incisions made over the varicose vein after the vein has been tied (ligated). Follow these instructions at home:   Do not stand or sit in one position for long periods of time. Do not sit with your legs crossed. Rest with your  legs raised during the day.  Wear compression stockings as directed by your health care provider. These stockings help to prevent blood clots and reduce swelling in your legs.  Do not wear other tight, encircling garments around your legs, pelvis, or waist.  Walk as much as possible to increase blood flow.  Raise the foot of your bed at night with 2-inch blocks.  If you get a cut in the skin over the vein and the vein bleeds, lie down with your leg raised and press on it with a clean cloth until the bleeding stops. Then place a bandage (dressing) on the cut. See your health care provider if it continues to bleed. Contact a health care provider if:  The skin around your ankle starts to break down.  You have pain, redness, tenderness, or hard swelling in your leg over a vein.  You are uncomfortable because of leg pain. This information is not intended to replace advice given to you by your health care provider. Make sure you discuss any questions you have with your health care provider. Document Released: 06/26/2005 Document Revised: 02/22/2016 Document Reviewed: 03/19/2016 Elsevier Interactive Patient Education  2017 [employer]  

## 2017-05-16 NOTE — Progress Notes (Signed)
Patient ID: Jean Brooks, female   DOB: 1946/09/24, 71 y.o.   MRN: [id number]  Chief Complaint  Patient presents with  . New Patient (Initial Visit)    Pt stated B/L calf having cramping especially Lt calf have tightness for about 3 months    HPI Jean Brooks is a 71 y.o. female.  The patient presents with complaints of symptomatic varicosities and leg pain. The patient reports a long standing history of varicosities and they have become painful over time. There was no clear inciting event or causative factor that started the symptoms. She has previously had a laser ablation procedure performed on the left leg about 10 years ago. The left leg is more severly affected currently. The patient elevates the legs for relief. The pain is described as heaviness and fullness in the legs. She feels like her legs are going to pop. The symptoms are generally most severe in the evening, particularly when they have been on their feet for long periods of time. Compression stockings have been used to try to improve the symptoms with some success. The patient complains of occasional swelling as an associated symptom. The patient has no previous history of deep venous thrombosis or superficial thrombophlebitis to their knowledge.     Past Medical History:  Diagnosis Date  . Chest pain, non-cardiac 2010   a. stress echo 2010: nl treadmill EKG w/o evidence of ischemia or arrhythmia, good exercise tolerance for age, normal stress echo images w/o evidence of myocardial ischemia; b. baseline echo with EF 55% peak stress showed nl LV systolic function wtih EF 65% w/o evidence of HK  . Diverticulitis of colon with hemorrhage August 2009   Hospitalized [facility]  . H/O syncope    a. echo 2010: EF 60%, LV nl size, RV nl size & fxn, atria nl size, aorta nl, no pericardial effusion, aortic valve nl, mitral valve nl w/ mild insufficiency, tricuspid valve nl w/ mild insufficiency, pulmonic valve nl  . Migraine  headache   . Scoliosis of thoracic spine   . Shingles     Past Surgical History:  Procedure Laterality Date  . ABDOMINAL HYSTERECTOMY  1991  . APPENDECTOMY  1963    Family History  Problem Relation Age of Onset  . COPD Mother 69       respiratory failure/fibrosis, seizures  . Stroke Mother   . Hypertension Mother   . Supraventricular tachycardia Mother   . COPD Father 20  . Mental illness Father        Parkinson's Dementia  . Aneurysm Father 76  . Stroke Son 53       hypertensive, left brain   . Heart attack Maternal Grandfather 55       passed  . Stroke Maternal Grandmother   . Breast cancer Maternal Aunt      Social History Social History  Substance Use Topics  . Smoking status: Never Smoker  . Smokeless tobacco: Never Used  . Alcohol use No  Works at the hospital  Allergies  Allergen Reactions  . Sulfa Antibiotics Hives  . Erythromycin   . Flagyl [Metronidazole]   . Nitrofurantoin Monohyd Macro   . Penicillins   . Azithromycin Rash    Within the hour  . Ciprofloxacin Rash    After 3 day  . Ibuprofen Rash  . Nitrofurantoin Rash    Current Outpatient Prescriptions  Medication Sig Dispense Refill  . Cholecalciferol (VITAMIN D3) 1000 UNITS CAPS Take 1 capsule by mouth  daily. Reported on 10/31/2015    . Multiple Vitamin (MULTI-VITAMINS) TABS Take by mouth.    . Multiple Vitamin (MULTIVITAMIN) tablet Take 1 tablet by mouth daily.    Marland Kitchen zolpidem (AMBIEN CR) 12.5 MG CR tablet TAKE 1 TABLET BY MOUTH AT BEDTIME AS NEEDED FOR SLEEP 30 tablet 5   No current facility-administered medications for this visit.       REVIEW OF SYSTEMS (Negative unless checked)  Constitutional: [] Weight loss  [] Fever  [] Chills Cardiac: [] Chest pain   [] Chest pressure   [] Palpitations   [] Shortness of breath when laying flat   [] Shortness of breath at rest   [] Shortness of breath with exertion. Vascular:  [] Pain in legs with walking   [x] Pain in legs at rest   [] Pain in legs when  laying flat   [] Claudication   [] Pain in feet when walking  [] Pain in feet at rest  [] Pain in feet when laying flat   [] History of DVT   [] Phlebitis   [x] Swelling in legs   [x] Varicose veins   [] Non-healing ulcers Pulmonary:   [] Uses home oxygen   [] Productive cough   [] Hemoptysis   [] Wheeze  [] COPD   [] Asthma Neurologic:  [x] Dizziness  [x] Blackouts   [] Seizures   [] History of stroke   [] History of TIA  [] Aphasia   [] Temporary blindness   [] Dysphagia   [] Weakness or numbness in arms   [] Weakness or numbness in legs Musculoskeletal:  [] Arthritis   [] Joint swelling   [] Joint pain   [] Low back pain Hematologic:  [] Easy bruising  [] Easy bleeding   [] Hypercoagulable state   [] Anemic  [] Hepatitis Gastrointestinal:  [] Blood in stool   [] Vomiting blood  [] Gastroesophageal reflux/heartburn   [] Abdominal pain Genitourinary:  [] Chronic kidney disease   [] Difficult urination  [] Frequent urination  [] Burning with urination   [] Hematuria Skin:  [] Rashes   [] Ulcers   [] Wounds Psychological:  [] History of anxiety   []  History of major depression.    Physical Exam BP 123/66   Pulse 67   Resp 16   Ht 5\' 2"  (1.575 m)   Wt 114 lb 6.4 oz (51.9 kg)   BMI 20.92 kg/m  Gen:  WD/WN, NAD Head: [place]/AT, No temporalis wasting.  Ear/Nose/Throat: Hearing grossly intact, dentition good Eyes: Sclera non-icteric. Conjunctiva clear Neck: Supple, no nuchal rigidity. Trachea midline Pulmonary:  Good air movement, no use of accessory muscles, respirations not labored.  Cardiac: RRR, No JVD Vascular: Varicosities scattered and measuring up to 2 mm in the right lower extremity        Varicosities diffuse and measuring up to 2 mm in the left lower extremity Vessel Right Left  Radial Palpable Palpable  Ulnar Palpable Palpable  Brachial Palpable Palpable  Carotid Palpable, without bruit Palpable, without bruit  Aorta Not palpable N/A  Femoral Palpable Palpable  Popliteal Palpable Palpable  PT Palpable Palpable  DP Palpable  Palpable   Gastrointestinal: soft, non-tender/non-distended.  Musculoskeletal: M/S 5/5 throughout.  No significant LE edema today Neurologic: Sensation grossly intact in extremities.  Symmetrical.  Speech is fluent.  Psychiatric: Judgment intact, Mood & affect appropriate for pt's clinical situation. Dermatologic: No rashes or ulcers noted.  No cellulitis or open wounds.    Radiology No results found.  Labs No results found for this or any previous visit (from the past 2160 hour(s)).  Assessment/Plan:  Syncope and collapse Previous workup. No recent symptoms  Hyperlipidemia lipid control important in reducing the progression of atherosclerotic disease.   Scoliosis of thoracic spine Some of  her symptoms of the lower extremities could represent neuropathic pain from spine disease or other issues.  Varicose veins of leg with pain, bilateral See below    The patient has symptoms consistent with chronic venous insufficiency. We discussed the natural history and treatment options for venous disease. I recommended the regular use of 20 - 30 mm Hg compression stockings, and she is already wearing these. I recommended leg elevation and anti-inflammatories as needed for pain. I have also recommended a complete venous duplex to assess the venous system for reflux or thrombotic issues. This can be done at the patient's convenience. I will see the patient back after the duplex to assess the response to conservative management, and determine further treatment options.     Leotis Pain 05/16/2017, 9:37 AM   This note was created with Dragon medical transcription system.  Any errors from dictation are unintentional.

## 2017-05-16 NOTE — Assessment & Plan Note (Signed)
Some of her symptoms of the lower extremities could represent neuropathic pain from spine disease or other issues.

## 2017-06-12 ENCOUNTER — Other Ambulatory Visit: Payer: Self-pay | Admitting: Internal Medicine

## 2017-06-12 NOTE — Telephone Encounter (Signed)
Refill for 30 days only.   

## 2017-06-12 NOTE — Telephone Encounter (Signed)
Last OV 05/16/2017 Next OV 06/27/2017 Last refill 12/25/2016

## 2017-06-18 ENCOUNTER — Other Ambulatory Visit: Payer: Self-pay | Admitting: Internal Medicine

## 2017-06-18 DIAGNOSIS — Z1231 Encounter for screening mammogram for malignant neoplasm of breast: Secondary | ICD-10-CM

## 2017-06-25 ENCOUNTER — Encounter (INDEPENDENT_AMBULATORY_CARE_PROVIDER_SITE_OTHER): Payer: Self-pay | Admitting: Vascular Surgery

## 2017-06-25 ENCOUNTER — Ambulatory Visit (INDEPENDENT_AMBULATORY_CARE_PROVIDER_SITE_OTHER): Payer: 59 | Admitting: Vascular Surgery

## 2017-06-25 ENCOUNTER — Ambulatory Visit (INDEPENDENT_AMBULATORY_CARE_PROVIDER_SITE_OTHER): Payer: 59

## 2017-06-25 VITALS — BP 135/74 | HR 67 | Resp 17 | Wt 113.2 lb

## 2017-06-25 DIAGNOSIS — I872 Venous insufficiency (chronic) (peripheral): Secondary | ICD-10-CM | POA: Insufficient documentation

## 2017-06-25 DIAGNOSIS — I83813 Varicose veins of bilateral lower extremities with pain: Secondary | ICD-10-CM

## 2017-06-25 DIAGNOSIS — I890 Lymphedema, not elsewhere classified: Secondary | ICD-10-CM | POA: Insufficient documentation

## 2017-06-25 NOTE — Progress Notes (Signed)
Subjective:    Patient ID: Jean Brooks, female    DOB: 1945-11-16, 71 y.o.   MRN: [id number] No chief complaint on file.   Patient last seen in our office on 05/16/2017 with a chief complaint of bilateral lower extremity painful varicose veins. She presents today to review vascular studies. Since her last visit, she continues to wear medical grade 1 compression stockings, elevate her legs heart level or higher and remain active with minimal improvement to her painful and enlarging varicosities. Her discomfort has progressive the point she is unable to function on a daily basis. Today she underwent a bilateral lower extremity venous duplex which was notable for right lower extremity: Reflux in the common femoral vein. Left: Reflux in the popliteal vein with a ablated great saphenous vein. No DVT or SVT. Patient denies any fever, nausea or vomiting.   Review of Systems  Constitutional: Negative.   HENT: Negative.   Eyes: Negative.   Respiratory: Negative.   Cardiovascular:       Painful lower extremity varicosities "bilateral"  Gastrointestinal: Negative.   Endocrine: Negative.   Genitourinary: Negative.   Musculoskeletal: Negative.   Skin: Negative.   Allergic/Immunologic: Negative.   Neurological: Negative.   Hematological: Negative.   Psychiatric/Behavioral: Negative.       Objective:   Physical Exam  Constitutional: She is oriented to person, place, and time. She appears well-developed and well-nourished. No distress.  HENT:  Head: Normocephalic and atraumatic.  Eyes: Pupils are equal, round, and reactive to light. Conjunctivae are normal.  Neck: Normal range of motion.  Cardiovascular: Normal rate, regular rhythm, normal heart sounds and intact distal pulses.   Pulses:      Radial pulses are 2+ on the right side, and 2+ on the left side.       Dorsalis pedis pulses are 2+ on the right side, and 2+ on the left side.       Posterior tibial pulses are 2+ on the right  side, and 2+ on the left side.  Pulmonary/Chest: Effort normal.  Musculoskeletal: Normal range of motion. She exhibits edema (Mild bilateral edema noted).  Neurological: She is alert and oriented to person, place, and time.  Skin: Skin is warm and dry. She is not diaphoretic.  Scattered less than 1 cm varicosities bilaterally No dermatitis noted. No cellulitis noted. Skin intact.  Psychiatric: She has a normal mood and affect. Her behavior is normal. Judgment and thought content normal.  Vitals reviewed.   There were no vitals taken for this visit.  Past Medical History:  Diagnosis Date  . Chest pain, non-cardiac 2010   a. stress echo 2010: nl treadmill EKG w/o evidence of ischemia or arrhythmia, good exercise tolerance for age, normal stress echo images w/o evidence of myocardial ischemia; b. baseline echo with EF 55% peak stress showed nl LV systolic function wtih EF 65% w/o evidence of HK  . Diverticulitis of colon with hemorrhage August 2009   Hospitalized [facility]  . H/O syncope    a. echo 2010: EF 60%, LV nl size, RV nl size & fxn, atria nl size, aorta nl, no pericardial effusion, aortic valve nl, mitral valve nl w/ mild insufficiency, tricuspid valve nl w/ mild insufficiency, pulmonic valve nl  . Migraine headache   . Scoliosis of thoracic spine   . Shingles     Social History   Social History  . Marital status: Married    Spouse name: N/A  . Number of children: N/A  .  Years of education: N/A   Occupational History  . Not on file.   Social History Main Topics  . Smoking status: Never Smoker  . Smokeless tobacco: Never Used  . Alcohol use No  . Drug use: No  . Sexual activity: Not on file   Other Topics Concern  . Not on file   Social History Narrative  . No narrative on file    Past Surgical History:  Procedure Laterality Date  . ABDOMINAL HYSTERECTOMY  1991  . APPENDECTOMY  1963    Family History  Problem Relation Age of Onset  . COPD Mother 54        respiratory failure/fibrosis, seizures  . Stroke Mother   . Hypertension Mother   . Supraventricular tachycardia Mother   . COPD Father 67  . Mental illness Father        Parkinson's Dementia  . Aneurysm Father 61  . Stroke Son 43       hypertensive, left brain   . Heart attack Maternal Grandfather 55       passed  . Stroke Maternal Grandmother   . Breast cancer Maternal Aunt     Allergies  Allergen Reactions  . Sulfa Antibiotics Hives  . Erythromycin   . Flagyl [Metronidazole]   . Nitrofurantoin Monohyd Macro   . Penicillins   . Azithromycin Rash    Within the hour  . Ciprofloxacin Rash    After 3 day  . Ibuprofen Rash  . Nitrofurantoin Rash       Assessment & Plan:  Patient last seen in our office on 05/16/2017 with a chief complaint of bilateral lower extremity painful varicose veins. She presents today to review vascular studies. Since her last visit, she continues to wear medical grade 1 compression stockings, elevate her legs heart level or higher and remain active with minimal improvement to her painful and enlarging varicosities. Her discomfort has progressive the point she is unable to function on a daily basis. Today she underwent a bilateral lower extremity venous duplex which was notable for right lower extremity: Reflux in the common femoral vein. Left: Reflux in the popliteal vein with a ablated great saphenous vein. No DVT or SVT. Patient denies any fever, nausea or vomiting.  1. Varicose veins of leg with pain, bilateral - Stable Patient has a documented history of chronic venous insufficiency. Status post a left greater saphenous vein ablation over 10 years ago Patient continues to wear medical grade 1 compression stockings, elevates her legs heart level or higher and remains active with minimal improvement in her symptoms requiring the use of over-the-counter anti-inflammatories with minimal relief. Her discomfort has progressive the point she is unable to  function on a daily basis. The patient would benefit from the as a benefit of a lymphedema pump to promote healthy circulation Should also benefit from saline sclerotherapy to her painful superficial varicosities in the bilateral lower externally. The patient agrees with the above plan. Will apply to insurance  2. Lymphedema - Stable See above  3. Chronic venous insufficiency - Stable See above  Current Outpatient Prescriptions on File Prior to Visit  Medication Sig Dispense Refill  . Cholecalciferol (VITAMIN D3) 1000 UNITS CAPS Take 1 capsule by mouth daily. Reported on 10/31/2015    . Multiple Vitamin (MULTI-VITAMINS) TABS Take by mouth.    . Multiple Vitamin (MULTIVITAMIN) tablet Take 1 tablet by mouth daily.    Marland Kitchen zolpidem (AMBIEN CR) 12.5 MG CR tablet TAKE 1 TABLET BY MOUTH NIGHTLY  AT BEDTIME AS NEEDED FOR SLEEP 30 tablet 0   No current facility-administered medications on file prior to visit.     There are no Patient Instructions on file for this visit. No Follow-up on file.   Danilo Cappiello A Shakya Sebring, PA-C

## 2017-06-27 ENCOUNTER — Ambulatory Visit (INDEPENDENT_AMBULATORY_CARE_PROVIDER_SITE_OTHER): Payer: 59 | Admitting: Internal Medicine

## 2017-06-27 ENCOUNTER — Encounter: Payer: Self-pay | Admitting: Internal Medicine

## 2017-06-27 VITALS — BP 100/60 | HR 65 | Temp 98.1°F | Resp 14 | Ht 62.0 in | Wt 114.0 lb

## 2017-06-27 DIAGNOSIS — G4700 Insomnia, unspecified: Secondary | ICD-10-CM

## 2017-06-27 DIAGNOSIS — E785 Hyperlipidemia, unspecified: Secondary | ICD-10-CM | POA: Diagnosis not present

## 2017-06-27 DIAGNOSIS — Z780 Asymptomatic menopausal state: Secondary | ICD-10-CM

## 2017-06-27 DIAGNOSIS — E7800 Pure hypercholesterolemia, unspecified: Secondary | ICD-10-CM

## 2017-06-27 DIAGNOSIS — R5383 Other fatigue: Secondary | ICD-10-CM

## 2017-06-27 DIAGNOSIS — Z0001 Encounter for general adult medical examination with abnormal findings: Secondary | ICD-10-CM

## 2017-06-27 DIAGNOSIS — E559 Vitamin D deficiency, unspecified: Secondary | ICD-10-CM | POA: Diagnosis not present

## 2017-06-27 DIAGNOSIS — Z0000 Encounter for general adult medical examination without abnormal findings: Secondary | ICD-10-CM

## 2017-06-27 LAB — COMPREHENSIVE METABOLIC PANEL
ALT: 12 U/L (ref 0–35)
AST: 17 U/L (ref 0–37)
Albumin: 4.3 g/dL (ref 3.5–5.2)
Alkaline Phosphatase: 53 U/L (ref 39–117)
BUN: 20 mg/dL (ref 6–23)
CHLORIDE: 102 meq/L (ref 96–112)
CO2: 29 mEq/L (ref 19–32)
Calcium: 9.4 mg/dL (ref 8.4–10.5)
Creatinine, Ser: 0.69 mg/dL (ref 0.40–1.20)
GFR: 89.39 mL/min (ref >60.00)
GLUCOSE: 95 mg/dL (ref 70–99)
POTASSIUM: 3.9 meq/L (ref 3.5–5.1)
SODIUM: 136 meq/L (ref 135–145)
Total Bilirubin: 0.7 mg/dL (ref 0.2–1.2)
Total Protein: 7 g/dL (ref 6.0–8.3)

## 2017-06-27 LAB — LIPID PANEL
Cholesterol: 206 mg/dL — ABNORMAL HIGH (ref 0–200)
HDL: 67.5 mg/dL (ref >39.00)
LDL CALC: 126 mg/dL — AB (ref 0–99)
NONHDL: 138.42
Total CHOL/HDL Ratio: 3
Triglycerides: 61 mg/dL (ref 0.0–149.0)
VLDL: 12.2 mg/dL (ref 0.0–40.0)

## 2017-06-27 LAB — TSH: TSH: 1.58 u[IU]/mL (ref 0.35–4.50)

## 2017-06-27 LAB — VITAMIN D 25 HYDROXY (VIT D DEFICIENCY, FRACTURES): VITD: 26.61 ng/mL — ABNORMAL LOW (ref 30.00–100.00)

## 2017-06-27 MED ORDER — TEMAZEPAM 30 MG PO CAPS: 30.0000 mg | ORAL_CAPSULE | Freq: Every evening | ORAL | 0 refills | Status: DC | PRN

## 2017-06-27 NOTE — Patient Instructions (Addendum)
I will order DEXA  Scan (last one ws 2012)  Trial of temazepam (Restoril) instead of ambien CR at night  You can still use trazodone    The ShingRx vaccine is now available in local pharmacies and is much more protective thant Zostavaxs,  It is therefore ADVISED for all interested adults over 50 to prevent shingles    Health Maintenance for Postmenopausal Women Menopause is a normal process in which your reproductive ability comes to an end. This process happens gradually over a span of months to years, usually between the ages of 67 and 18. Menopause is complete when you have missed 12 consecutive menstrual periods. It is important to talk with your health care provider about some of the most common conditions that affect postmenopausal women, such as heart disease, cancer, and bone loss (osteoporosis). Adopting a healthy lifestyle and getting preventive care can help to promote your health and wellness. Those actions can also lower your chances of developing some of these common conditions. What should I know about menopause? During menopause, you may experience a number of symptoms, such as:  Moderate-to-severe hot flashes.  Night sweats.  Decrease in sex drive.  Mood swings.  Headaches.  Tiredness.  Irritability.  Memory problems.  Insomnia.  Choosing to treat or not to treat menopausal changes is an individual decision that you make with your health care provider. What should I know about hormone replacement therapy and supplements? Hormone therapy products are effective for treating symptoms that are associated with menopause, such as hot flashes and night sweats. Hormone replacement carries certain risks, especially as you become older. If you are thinking about using estrogen or estrogen with progestin treatments, discuss the benefits and risks with your health care provider. What should I know about heart disease and stroke? Heart disease, heart attack, and stroke  become more likely as you age. This may be due, in part, to the hormonal changes that your body experiences during menopause. These can affect how your body processes dietary fats, triglycerides, and cholesterol. Heart attack and stroke are both medical emergencies. There are many things that you can do to help prevent heart disease and stroke:  Have your blood pressure checked at least every 1-2 years. High blood pressure causes heart disease and increases the risk of stroke.  If you are 73-82 years old, ask your health care provider if you should take aspirin to prevent a heart attack or a stroke.  Do not use any tobacco products, including cigarettes, chewing tobacco, or electronic cigarettes. If you need help quitting, ask your health care provider.  It is important to eat a healthy diet and maintain a healthy weight. ? Be sure to include plenty of vegetables, fruits, low-fat dairy products, and lean protein. ? Avoid eating foods that are high in solid fats, added sugars, or salt (sodium).  Get regular exercise. This is one of the most important things that you can do for your health. ? Try to exercise for at least 150 minutes each week. The type of exercise that you do should increase your heart rate and make you sweat. This is known as moderate-intensity exercise. ? Try to do strengthening exercises at least twice each week. Do these in addition to the moderate-intensity exercise.  Know your numbers.Ask your health care provider to check your cholesterol and your blood glucose. Continue to have your blood tested as directed by your health care provider.  What should I know about cancer screening? There are several types  of cancer. Take the following steps to reduce your risk and to catch any cancer development as early as possible. Breast Cancer  Practice breast self-awareness. ? This means understanding how your breasts normally appear and feel. ? It also means doing regular breast  self-exams. Let your health care provider know about any changes, no matter how small.  If you are 65 or older, have a clinician do a breast exam (clinical breast exam or CBE) every year. Depending on your age, family history, and medical history, it may be recommended that you also have a yearly breast X-ray (mammogram).  If you have a family history of breast cancer, talk with your health care provider about genetic screening.  If you are at high risk for breast cancer, talk with your health care provider about having an MRI and a mammogram every year.  Breast cancer (BRCA) gene test is recommended for women who have family members with BRCA-related cancers. Results of the assessment will determine the need for genetic counseling and BRCA1 and for BRCA2 testing. BRCA-related cancers include these types: ? Breast. This occurs in males or females. ? Ovarian. ? Tubal. This may also be called fallopian tube cancer. ? Cancer of the abdominal or pelvic lining (peritoneal cancer). ? Prostate. ? Pancreatic.  Cervical, Uterine, and Ovarian Cancer Your health care provider may recommend that you be screened regularly for cancer of the pelvic organs. These include your ovaries, uterus, and vagina. This screening involves a pelvic exam, which includes checking for microscopic changes to the surface of your cervix (Pap test).  For women ages 21-65, health care providers may recommend a pelvic exam and a Pap test every three years. For women ages 63-65, they may recommend the Pap test and pelvic exam, combined with testing for human papilloma virus (HPV), every five years. Some types of HPV increase your risk of cervical cancer. Testing for HPV may also be done on women of any age who have unclear Pap test results.  Other health care providers may not recommend any screening for nonpregnant women who are considered low risk for pelvic cancer and have no symptoms. Ask your health care provider if a screening  pelvic exam is right for you.  If you have had past treatment for cervical cancer or a condition that could lead to cancer, you need Pap tests and screening for cancer for at least 20 years after your treatment. If Pap tests have been discontinued for you, your risk factors (such as having a new sexual partner) need to be reassessed to determine if you should start having screenings again. Some women have medical problems that increase the chance of getting cervical cancer. In these cases, your health care provider may recommend that you have screening and Pap tests more often.  If you have a family history of uterine cancer or ovarian cancer, talk with your health care provider about genetic screening.  If you have vaginal bleeding after reaching menopause, tell your health care provider.  There are currently no reliable tests available to screen for ovarian cancer.  Lung Cancer Lung cancer screening is recommended for adults 37-32 years old who are at high risk for lung cancer because of a history of smoking. A yearly low-dose CT scan of the lungs is recommended if you:  Currently smoke.  Have a history of at least 30 pack-years of smoking and you currently smoke or have quit within the past 15 years. A pack-year is smoking an average of one pack  of cigarettes per day for one year.  Yearly screening should:  Continue until it has been 15 years since you quit.  Stop if you develop a health problem that would prevent you from having lung cancer treatment.  Colorectal Cancer  This type of cancer can be detected and can often be prevented.  Routine colorectal cancer screening usually begins at age 32 and continues through age 95.  If you have risk factors for colon cancer, your health care provider may recommend that you be screened at an earlier age.  If you have a family history of colorectal cancer, talk with your health care provider about genetic screening.  Your health care  provider may also recommend using home test kits to check for hidden blood in your stool.  A small camera at the end of a tube can be used to examine your colon directly (sigmoidoscopy or colonoscopy). This is done to check for the earliest forms of colorectal cancer.  Direct examination of the colon should be repeated every 5-10 years until age 61. However, if early forms of precancerous polyps or small growths are found or if you have a family history or genetic risk for colorectal cancer, you may need to be screened more often.  Skin Cancer  Check your skin from head to toe regularly.  Monitor any moles. Be sure to tell your health care provider: ? About any new moles or changes in moles, especially if there is a change in a mole's shape or color. ? If you have a mole that is larger than the size of a pencil eraser.  If any of your family members has a history of skin cancer, especially at a young age, talk with your health care provider about genetic screening.  Always use sunscreen. Apply sunscreen liberally and repeatedly throughout the day.  Whenever you are outside, protect yourself by wearing long sleeves, pants, a wide-brimmed hat, and sunglasses.  What should I know about osteoporosis? Osteoporosis is a condition in which bone destruction happens more quickly than new bone creation. After menopause, you may be at an increased risk for osteoporosis. To help prevent osteoporosis or the bone fractures that can happen because of osteoporosis, the following is recommended:  If you are 41-75 years old, get at least 1,000 mg of calcium and at least 600 mg of vitamin D per day.  If you are older than age 70 but younger than age 76, get at least 1,200 mg of calcium and at least 600 mg of vitamin D per day.  If you are older than age 49, get at least 1,200 mg of calcium and at least 800 mg of vitamin D per day.  Smoking and excessive alcohol intake increase the risk of osteoporosis. Eat  foods that are rich in calcium and vitamin D, and do weight-bearing exercises several times each week as directed by your health care provider. What should I know about how menopause affects my mental health? Depression may occur at any age, but it is more common as you become older. Common symptoms of depression include:  Low or sad mood.  Changes in sleep patterns.  Changes in appetite or eating patterns.  Feeling an overall lack of motivation or enjoyment of activities that you previously enjoyed.  Frequent crying spells.  Talk with your health care provider if you think that you are experiencing depression. What should I know about immunizations? It is important that you get and maintain your immunizations. These include:  Tetanus,  diphtheria, and pertussis (Tdap) booster vaccine.  Influenza every year before the flu season begins.  Pneumonia vaccine.  Shingles vaccine.  Your health care provider may also recommend other immunizations. This information is not intended to replace advice given to you by your health care provider. Make sure you discuss any questions you have with your health care provider. Document Released: 11/08/2005 Document Revised: 04/05/2016 Document Reviewed: 06/20/2015 Elsevier Interactive Patient Education  2018 [employer].

## 2017-06-27 NOTE — Progress Notes (Signed)
Patient ID: Jean Brooks, female    DOB: [birth year]  Age: 71 y.o. MRN: [id number]  The patient is here for annual preventive examination and management of other chronic and acute problems.   The risk factors are reflected in the social history.  The roster of all physicians providing medical care to patient - is listed in the Snapshot section of the chart.  Activities of daily living:  The patient is 100% independent in all ADLs: dressing, toileting, feeding as well as independent mobility  Home safety : The patient has smoke detectors in the home. They wear seatbelts.  There are no firearms at home. There is no violence in the home.   There is no risks for hepatitis, STDs or HIV. There is no   history of blood transfusion. They have no travel history to infectious disease endemic areas of the world.  The patient has seen their dentist in the last six month. They have seen their eye doctor in the last year. They admit to slight hearing difficulty with regard to whispered voices and some television programs.  They have deferred audiologic testing in the last year.  They do not  have excessive sun exposure. Discussed the need for sun protection: hats, long sleeves and use of sunscreen if there is significant sun exposure.   Diet: the importance of a healthy diet is discussed. They do have a healthy diet.  The benefits of regular aerobic exercise were discussed. She walks 4 times per week ,  20 minutes.   Depression screen: there are no signs or vegative symptoms of depression- irritability, change in appetite, anhedonia, sadness/tearfullness.  Cognitive assessment: the patient manages all their financial and personal affairs and is actively engaged. They could relate day,date,year and events; recalled 2/3 objects at 3 minutes; performed clock-face test normally.  The following portions of the patient's history were reviewed and updated as appropriate: allergies, current medications, past  family history, past medical history,  past surgical history, past social history  and problem list.  Visual acuity was not assessed per patient preference since she has regular follow up with her ophthalmologist. Hearing and body mass index were assessed and reviewed.   During the course of the visit the patient was educated and counseled about appropriate screening and preventive services including : fall prevention , diabetes screening, nutrition counseling, colorectal cancer screening, and recommended immunizations.    CC: The primary encounter diagnosis was Vitamin D deficiency. Diagnoses of Pure hypercholesterolemia, Fatigue, unspecified type, Postmenopausal estrogen deficiency, Encounter for preventive health examination, and Insomnia, persistent were also pertinent to this visit.  INSOMNIA,  Chronic despite using ambien CR and trazodone.  Waking up at 1 am wide awake.  Rises at 5 am  Still working full time at the hospital. No alcohol  Use.     History Jean Brooks has a past medical history of Chest pain, non-cardiac (2010); Diverticulitis of colon with hemorrhage (August 2009); H/O syncope; Migraine headache; Scoliosis of thoracic spine; and Shingles.   She has a past surgical history that includes Appendectomy (1963) and Abdominal hysterectomy (1991).   Her family history includes Aneurysm (age of onset: 75) in her father; Breast cancer in her maternal aunt; COPD (age of onset: 61) in her father; COPD (age of onset: 11) in her mother; Heart attack (age of onset: 70) in her maternal grandfather; Hypertension in her mother; Mental illness in her father; Stroke in her maternal grandmother and mother; Stroke (age of onset: 65) in her son; Supraventricular  tachycardia in her mother.She reports that she has never smoked. She has never used smokeless tobacco. She reports that she does not drink alcohol or use drugs.  Outpatient Medications Prior to Visit  Medication Sig Dispense Refill  .  Cholecalciferol (VITAMIN D3) 1000 UNITS CAPS Take 1 capsule by mouth daily. Reported on 10/31/2015    . Multiple Vitamin (MULTIVITAMIN) tablet Take 1 tablet by mouth daily.    Marland Kitchen zolpidem (AMBIEN CR) 12.5 MG CR tablet TAKE 1 TABLET BY MOUTH NIGHTLY AT BEDTIME AS NEEDED FOR SLEEP 30 tablet 0  . Multiple Vitamin (MULTI-VITAMINS) TABS Take by mouth.     No facility-administered medications prior to visit.     Review of Systems   Patient denies headache, fevers, malaise, unintentional weight loss, skin rash, eye pain, sinus congestion and sinus pain, sore throat, dysphagia,  hemoptysis , cough, dyspnea, wheezing, chest pain, palpitations, orthopnea, edema, abdominal pain, nausea, melena, diarrhea, constipation, flank pain, dysuria, hematuria, urinary  Frequency, nocturia, numbness, tingling, seizures,  Focal weakness, Loss of consciousness,  Tremor, insomnia, depression, anxiety, and suicidal ideation.     Objective:  BP 100/60 (BP Location: Left Arm, Patient Position: Sitting, Cuff Size: Normal)   Pulse 65   Temp 98.1 F (36.7 C) (Oral)   Resp 14   Ht 5\' 2"  (1.575 m)   Wt 114 lb (51.7 kg)   SpO2 98%   BMI 20.85 kg/m   Physical Exam   General appearance: alert, cooperative and appears stated age Head: Normocephalic, without obvious abnormality, atraumatic Eyes: conjunctivae/corneas clear. PERRL, EOM's intact. Fundi benign. Ears: normal TM's and external ear canals both ears Nose: Nares normal. Septum midline. Mucosa normal. No drainage or sinus tenderness. Throat: lips, mucosa, and tongue normal; teeth and gums normal Neck: no adenopathy, no carotid bruit, no JVD, supple, symmetrical, trachea midline and thyroid not enlarged, symmetric, no tenderness/mass/nodules Lungs: clear to auscultation bilaterally Breasts: normal appearance, no masses or tenderness Heart: regular rate and rhythm, S1, S2 normal, no murmur, click, rub or gallop Abdomen: soft, non-tender; bowel sounds normal; no  masses,  no organomegaly Extremities: extremities normal, atraumatic, no cyanosis or edema Pulses: 2+ and symmetric Skin: Skin color, texture, turgor normal. No rashes or lesions Neurologic: Alert and oriented X 3, normal strength and tone. Normal symmetric reflexes. Normal coordination and gait.      Assessment & Plan:   Problem List Items Addressed This Visit    Encounter for preventive health examination    Annual comprehensive preventive exam was done as well as an evaluation and management of chronic conditions .  During the course of the visit the patient was educated and counseled about appropriate screening and preventive services including :  diabetes screening, lipid analysis with projected  10 year  risk for CAD , nutrition counseling, breast, cervical and colorectal cancer screening, and recommended immunizations.  Printed recommendations for health maintenance screenings was given.      Hyperlipidemia    Mild.   given changes noted on previous MRI,  Recommended taking a statin, which she has deferred .   Lab Results  Component Value Date   CHOL 206 (H) 06/27/2017   HDL 67.50 06/27/2017   LDLCALC 126 (H) 06/27/2017   TRIG 61.0 06/27/2017   CHOLHDL 3 06/27/2017         Relevant Orders   Lipid panel (Completed)   Insomnia, persistent    Trial of restoril and dc ambien.  Continue trazodone.        Other  Visit Diagnoses    Vitamin D deficiency    -  Primary   Relevant Orders   VITAMIN D 25 Hydroxy (Vit-D Deficiency, Fractures) (Completed)   Fatigue, unspecified type       Relevant Orders   Comprehensive metabolic panel (Completed)   TSH (Completed)   Postmenopausal estrogen deficiency       Relevant Orders   DG Bone Density      I have discontinued Ms. Ogas's MULTI-VITAMINS. I am also having her start on temazepam. Additionally, I am having her maintain her multivitamin, Vitamin D3, and zolpidem.  Meds ordered this encounter  Medications  . temazepam  (RESTORIL) 30 MG capsule    Sig: Take 1 capsule (30 mg total) by mouth at bedtime as needed for sleep.    Dispense:  30 capsule    Refill:  0    Medications Discontinued During This Encounter  Medication Reason  . Multiple Vitamin (MULTI-VITAMINS) TABS Duplicate    Follow-up: Return in about 6 months (around 12/25/2017).   Crecencio Mc, MD

## 2017-06-29 NOTE — Assessment & Plan Note (Signed)
Annual comprehensive preventive exam was done as well as an evaluation and management of chronic conditions .  During the course of the visit the patient was educated and counseled about appropriate screening and preventive services including :  diabetes screening, lipid analysis with projected  10 year  risk for CAD , nutrition counseling, breast, cervical and colorectal cancer screening, and recommended immunizations.  Printed recommendations for health maintenance screenings was given 

## 2017-06-29 NOTE — Assessment & Plan Note (Signed)
Mild.   given changes noted on previous MRI,  Recommended taking a statin, which she has deferred .   Lab Results  Component Value Date   CHOL 206 (H) 06/27/2017   HDL 67.50 06/27/2017   LDLCALC 126 (H) 06/27/2017   TRIG 61.0 06/27/2017   CHOLHDL 3 06/27/2017

## 2017-06-29 NOTE — Assessment & Plan Note (Signed)
Trial of restoril and dc ambien.  Continue trazodone.

## 2017-07-02 DIAGNOSIS — E782 Mixed hyperlipidemia: Secondary | ICD-10-CM | POA: Diagnosis not present

## 2017-07-02 DIAGNOSIS — R001 Bradycardia, unspecified: Secondary | ICD-10-CM | POA: Diagnosis not present

## 2017-07-02 DIAGNOSIS — R42 Dizziness and giddiness: Secondary | ICD-10-CM | POA: Diagnosis not present

## 2017-07-16 ENCOUNTER — Other Ambulatory Visit: Payer: Self-pay | Admitting: Internal Medicine

## 2017-07-16 ENCOUNTER — Encounter: Payer: Self-pay | Admitting: Internal Medicine

## 2017-07-16 NOTE — Telephone Encounter (Signed)
Discontinuing due to last OV note.

## 2017-07-17 ENCOUNTER — Other Ambulatory Visit: Payer: Self-pay | Admitting: Internal Medicine

## 2017-07-17 MED ORDER — ZOLPIDEM TARTRATE ER 12.5 MG PO TBCR: EXTENDED_RELEASE_TABLET | ORAL | 5 refills | Status: DC

## 2017-07-22 ENCOUNTER — Ambulatory Visit
Admission: RE | Admit: 2017-07-22 | Discharge: 2017-07-22 | Disposition: A | Discharge status: Home / Self Care | Payer: 59 | Source: Ambulatory Visit | Attending: Internal Medicine | Admitting: Internal Medicine

## 2017-07-22 DIAGNOSIS — Z1231 Encounter for screening mammogram for malignant neoplasm of breast: Secondary | ICD-10-CM | POA: Diagnosis not present

## 2017-08-04 DIAGNOSIS — H524 Presbyopia: Secondary | ICD-10-CM | POA: Diagnosis not present

## 2017-08-05 ENCOUNTER — Ambulatory Visit (INDEPENDENT_AMBULATORY_CARE_PROVIDER_SITE_OTHER): Payer: 59 | Admitting: Vascular Surgery

## 2017-08-14 ENCOUNTER — Ambulatory Visit: Payer: 59

## 2017-08-25 ENCOUNTER — Other Ambulatory Visit: Payer: 59

## 2017-08-26 ENCOUNTER — Ambulatory Visit (INDEPENDENT_AMBULATORY_CARE_PROVIDER_SITE_OTHER): Payer: 59 | Admitting: Vascular Surgery

## 2017-09-10 DIAGNOSIS — D2239 Melanocytic nevi of other parts of face: Secondary | ICD-10-CM | POA: Diagnosis not present

## 2017-09-10 DIAGNOSIS — D485 Neoplasm of uncertain behavior of skin: Secondary | ICD-10-CM | POA: Diagnosis not present

## 2017-09-10 DIAGNOSIS — L905 Scar conditions and fibrosis of skin: Secondary | ICD-10-CM | POA: Diagnosis not present

## 2017-09-10 DIAGNOSIS — L821 Other seborrheic keratosis: Secondary | ICD-10-CM | POA: Diagnosis not present

## 2017-09-12 ENCOUNTER — Ambulatory Visit (INDEPENDENT_AMBULATORY_CARE_PROVIDER_SITE_OTHER): Payer: 59 | Admitting: Vascular Surgery

## 2017-12-01 DIAGNOSIS — R1032 Left lower quadrant pain: Secondary | ICD-10-CM | POA: Diagnosis not present

## 2017-12-01 DIAGNOSIS — R110 Nausea: Secondary | ICD-10-CM | POA: Diagnosis not present

## 2017-12-01 DIAGNOSIS — R1111 Vomiting without nausea: Secondary | ICD-10-CM | POA: Diagnosis not present

## 2017-12-01 DIAGNOSIS — R1033 Periumbilical pain: Secondary | ICD-10-CM | POA: Diagnosis not present

## 2017-12-04 ENCOUNTER — Emergency Department: Payer: 59

## 2017-12-04 ENCOUNTER — Emergency Department
Admission: EM | Admit: 2017-12-04 | Discharge: 2017-12-04 | Disposition: A | Discharge status: Home / Self Care | Payer: 59 | Attending: Emergency Medicine | Admitting: Emergency Medicine

## 2017-12-04 ENCOUNTER — Other Ambulatory Visit: Payer: Self-pay

## 2017-12-04 DIAGNOSIS — N83202 Unspecified ovarian cyst, left side: Secondary | ICD-10-CM | POA: Diagnosis not present

## 2017-12-04 DIAGNOSIS — R21 Rash and other nonspecific skin eruption: Secondary | ICD-10-CM | POA: Diagnosis not present

## 2017-12-04 DIAGNOSIS — R1110 Vomiting, unspecified: Secondary | ICD-10-CM | POA: Diagnosis not present

## 2017-12-04 DIAGNOSIS — K529 Noninfective gastroenteritis and colitis, unspecified: Secondary | ICD-10-CM | POA: Diagnosis not present

## 2017-12-04 DIAGNOSIS — R109 Unspecified abdominal pain: Secondary | ICD-10-CM | POA: Diagnosis not present

## 2017-12-04 DIAGNOSIS — R197 Diarrhea, unspecified: Secondary | ICD-10-CM | POA: Diagnosis not present

## 2017-12-04 DIAGNOSIS — R1032 Left lower quadrant pain: Secondary | ICD-10-CM | POA: Diagnosis not present

## 2017-12-04 LAB — URINALYSIS, COMPLETE (UACMP) WITH MICROSCOPIC
Bacteria, UA: NONE SEEN
Bilirubin Urine: NEGATIVE
Glucose, UA: NEGATIVE mg/dL
Hgb urine dipstick: NEGATIVE
KETONES UR: 5 mg/dL — AB
Leukocytes, UA: NEGATIVE
Nitrite: NEGATIVE
PH: 6 (ref 5.0–8.0)
PROTEIN: NEGATIVE mg/dL
Specific Gravity, Urine: 1.043 — ABNORMAL HIGH (ref 1.005–1.030)
WBC, UA: NONE SEEN WBC/hpf (ref 0–5)

## 2017-12-04 LAB — COMPREHENSIVE METABOLIC PANEL
ALK PHOS: 60 U/L (ref 38–126)
ALT: 18 U/L (ref 14–54)
AST: 27 U/L (ref 15–41)
Albumin: 4.4 g/dL (ref 3.5–5.0)
Anion gap: 14 (ref 5–15)
BILIRUBIN TOTAL: 1.3 mg/dL — AB (ref 0.3–1.2)
BUN: 16 mg/dL (ref 6–20)
CALCIUM: 9 mg/dL (ref 8.9–10.3)
CHLORIDE: 105 mmol/L (ref 101–111)
CO2: 20 mmol/L — ABNORMAL LOW (ref 22–32)
CREATININE: 0.87 mg/dL (ref 0.44–1.00)
GFR calc Af Amer: >60 mL/min (ref >60)
Glucose, Bld: 159 mg/dL — ABNORMAL HIGH (ref 65–99)
Potassium: 3.8 mmol/L (ref 3.5–5.1)
Sodium: 139 mmol/L (ref 135–145)
Total Protein: 7.5 g/dL (ref 6.5–8.1)

## 2017-12-04 LAB — CBC WITH DIFFERENTIAL/PLATELET
BASOS ABS: 0 10*3/uL (ref 0–0.1)
Basophils Relative: 0 %
Eosinophils Absolute: 0.1 10*3/uL (ref 0–0.7)
Eosinophils Relative: 1 %
HEMATOCRIT: 42.9 % (ref 35.0–47.0)
HEMOGLOBIN: 14.6 g/dL (ref 12.0–16.0)
LYMPHS ABS: 0.3 10*3/uL — AB (ref 1.0–3.6)
LYMPHS PCT: 3 %
MCH: 29.9 pg (ref 26.0–34.0)
MCHC: 34 g/dL (ref 32.0–36.0)
MCV: 87.9 fL (ref 80.0–100.0)
Monocytes Absolute: 0.3 10*3/uL (ref 0.2–0.9)
Monocytes Relative: 3 %
NEUTROS ABS: 8 10*3/uL — AB (ref 1.4–6.5)
NEUTROS PCT: 93 %
PLATELETS: 223 10*3/uL (ref 150–440)
RBC: 4.89 MIL/uL (ref 3.80–5.20)
RDW: 12.7 % (ref 11.5–14.5)
WBC: 8.7 10*3/uL (ref 3.6–11.0)

## 2017-12-04 LAB — LIPASE, BLOOD: LIPASE: 36 U/L (ref 11–51)

## 2017-12-04 MED ORDER — SODIUM CHLORIDE 0.9 % IV BOLUS (SEPSIS)
1000.0000 mL | Freq: Once | INTRAVENOUS | Status: AC
  Administered 2017-12-04: 1000 mL via INTRAVENOUS

## 2017-12-04 MED ORDER — SODIUM CHLORIDE 0.9 % IV BOLUS (SEPSIS): 1000.0000 mL | Freq: Once | INTRAVENOUS | Status: DC

## 2017-12-04 MED ORDER — ONDANSETRON HCL 4 MG/2ML IJ SOLN
4.0000 mg | Freq: Once | INTRAMUSCULAR | Status: AC
  Administered 2017-12-04: 4 mg via INTRAVENOUS
  Filled 2017-12-04: qty 2

## 2017-12-04 MED ORDER — IOPAMIDOL (ISOVUE-300) INJECTION 61%
75.0000 mL | Freq: Once | INTRAVENOUS | Status: AC | PRN
  Administered 2017-12-04: 75 mL via INTRAVENOUS

## 2017-12-04 MED ORDER — CIPROFLOXACIN HCL 500 MG PO TABS: 500.0000 mg | ORAL_TABLET | Freq: Two times a day (BID) | ORAL | 0 refills | Status: AC

## 2017-12-04 MED ORDER — CIPROFLOXACIN HCL 500 MG PO TABS
500.0000 mg | ORAL_TABLET | Freq: Once | ORAL | Status: AC
  Administered 2017-12-04: 500 mg via ORAL
  Filled 2017-12-04: qty 1

## 2017-12-04 NOTE — ED Triage Notes (Signed)
Pt in with co n.v.d since 1600 with abd cramping. Started on augmentin Tuesday for possible diverticulitis.

## 2017-12-04 NOTE — ED Notes (Addendum)
Pt reports she has been feeling this way for 2 weeks. Thought she had a bug. Saw a doctor on Tuesday afternoon. Doctor placed her on Augmentin, even though she is allergic to Penicillins. Today around 3:30pm she began to feel sick again with vomiting, nausea, diarrhea, and pain in the left side of abdomin. Pt also states that when she is having the abdominal pain it is accompanied by numbness in the hands and feet. Family at bedside,.

## 2017-12-04 NOTE — ED Provider Notes (Signed)
[facility] Emergency Department Provider Note  ___________________________________________   First MD Initiated Contact with Patient 12/04/17 1916     (approximate)  I have reviewed the triage vital signs and the nursing notes.   HISTORY  Chief Complaint Emesis and Diarrhea   HPI Jean Brooks is a 72 y.o. female with a history of diverticulitis who is presenting to the emergency department today with left lower quadrant abdominal pain over the past week.  She was started on Augmentin by her primary care doctor but she said she developed a red rash and so stopped taking Augmentin after several days.  However, she says that she continues to be nauseous with intermittent pain in the left lower quadrant sometimes reaching a 10 out of 10 but only mild pain at this time.  Denies any burning with urination.  Says that she has also had 4 episodes of diarrhea per hour up until being placed in the room for examination and now she has not had any since being placed in the room.  Patient denies fever.  Past Medical History:  Diagnosis Date  . Chest pain, non-cardiac 2010   a. stress echo 2010: nl treadmill EKG w/o evidence of ischemia or arrhythmia, good exercise tolerance for age, normal stress echo images w/o evidence of myocardial ischemia; b. baseline echo with EF 55% peak stress showed nl LV systolic function wtih EF 65% w/o evidence of HK  . Diverticulitis of colon with hemorrhage August 2009   Hospitalized [facility]  . H/O syncope    a. echo 2010: EF 60%, LV nl size, RV nl size & fxn, atria nl size, aorta nl, no pericardial effusion, aortic valve nl, mitral valve nl w/ mild insufficiency, tricuspid valve nl w/ mild insufficiency, pulmonic valve nl  . Migraine headache   . Scoliosis of thoracic spine   . Shingles     Patient Active Problem List   Diagnosis Date Noted  . Lymphedema 06/25/2017  . Chronic venous insufficiency 06/25/2017  . Varicose veins of leg  with pain, bilateral 05/16/2017  . Stenosis of cervical spine 01/02/2017  . Hyperlipidemia 06/27/2016  . Injection site reaction 06/16/2015  . Osteopenia 06/14/2015  . SVT (supraventricular tachycardia) ([place]) 06/07/2014  . Encounter for preventive health examination 03/24/2013  . Dyspareunia, female 03/23/2013  . Constipation 10/06/2012  . Cyst, ovarian 12/29/2011  . Diverticulitis of colon with hemorrhage   . Migraine headache   . Scoliosis of thoracic spine   . Syncope and collapse   . Insomnia, persistent 12/25/2011  . Chest pain, non-cardiac     Past Surgical History:  Procedure Laterality Date  . ABDOMINAL HYSTERECTOMY  1991  . APPENDECTOMY  1963    Prior to Admission medications   Medication Sig Start Date End Date Taking? Authorizing Provider  Cholecalciferol (VITAMIN D3) 1000 UNITS CAPS Take 1 capsule by mouth daily. Reported on 10/31/2015    [provider]  Multiple Vitamin (MULTIVITAMIN) tablet Take 1 tablet by mouth daily.    [provider]  zolpidem (AMBIEN CR) 12.5 MG CR tablet TAKE 1 TABLET BY MOUTH NIGHTLY AT BEDTIME AS NEEDED FOR SLEEP 07/17/17   Crecencio Mc, MD    Allergies Sulfa antibiotics; Erythromycin; Flagyl [metronidazole]; Nitrofurantoin monohyd macro; Penicillins; Azithromycin; Ciprofloxacin; Ibuprofen; and Nitrofurantoin  Family History  Problem Relation Age of Onset  . COPD Mother 22       respiratory failure/fibrosis, seizures  . Stroke Mother   . Hypertension Mother   .  Supraventricular tachycardia Mother   . COPD Father 25  . Mental illness Father        Parkinson's Dementia  . Aneurysm Father 80  . Stroke Son 67       hypertensive, left brain   . Heart attack Maternal Grandfather 55       passed  . Stroke Maternal Grandmother   . Breast cancer Maternal Aunt     Social History Social History   Tobacco Use  . Smoking status: Never Smoker  . Smokeless tobacco: Never Used  Substance Use Topics  . Alcohol  use: No  . Drug use: No    Review of Systems  Constitutional: No fever/chills Eyes: No visual changes. ENT: No sore throat. Cardiovascular: Denies chest pain. Respiratory: Denies shortness of breath. Gastrointestinal: No constipation. Genitourinary: Negative for dysuria. Musculoskeletal: Negative for back pain. Skin: Negative for rash. Neurological: Negative for headaches, focal weakness or numbness.   ____________________________________________   PHYSICAL EXAM:  VITAL SIGNS: ED Triage Vitals [12/04/17 1917]  Enc Vitals Group     BP 125/63     Pulse Rate (!) 111     Resp 20     Temp 98.1 F (36.7 C)     Temp Source Oral     SpO2 100 %     Weight 112 lb (50.8 kg)     Height 5\' 2"  (1.575 m)     Head Circumference      Peak Flow      Pain Score 3     Pain Loc      Pain Edu?      Excl. in [place]?    Constitutional: Alert and oriented. Well appearing and in no acute distress. Eyes: Conjunctivae are normal.  Head: Atraumatic. Nose: No congestion/rhinnorhea. Mouth/Throat: Mucous membranes are moist.  Neck: No stridor.   Cardiovascular: Normal rate, regular rhythm. Grossly normal heart sounds.  Good peripheral circulation. Respiratory: Normal respiratory effort.  No retractions. Lungs CTAB. Gastrointestinal: Soft with moderate left lower quadrant tenderness palpation without rebound or guarding. No distention. No CVA tenderness. Musculoskeletal: No lower extremity tenderness nor edema.  No joint effusions. Neurologic:  Normal speech and language. No gross focal neurologic deficits are appreciated. Skin:  Skin is warm, dry and intact. No rash noted. Psychiatric: Mood and affect are normal. Speech and behavior are normal.  ____________________________________________   LABS (all labs ordered are listed, but only abnormal results are displayed)  Labs Reviewed  CBC WITH DIFFERENTIAL/PLATELET - Abnormal; Notable for the following components:      Result Value   Neutro  Abs 8.0 (*)    Lymphs Abs 0.3 (*)    All other components within normal limits  COMPREHENSIVE METABOLIC PANEL - Abnormal; Notable for the following components:   CO2 20 (*)    Glucose, Bld 159 (*)    Total Bilirubin 1.3 (*)    All other components within normal limits  URINALYSIS, COMPLETE (UACMP) WITH MICROSCOPIC - Abnormal; Notable for the following components:   Color, Urine YELLOW (*)    APPearance CLEAR (*)    Specific Gravity, Urine 1.043 (*)    Ketones, ur 5 (*)    Squamous Epithelial / LPF 0-5 (*)    All other components within normal limits  GASTROINTESTINAL PANEL BY PCR, STOOL (REPLACES STOOL CULTURE)  C DIFFICILE QUICK SCREEN W PCR REFLEX  LIPASE, BLOOD   ____________________________________________  EKG   ____________________________________________  RADIOLOGY  Is most likely indicating colitis involving the transverse colon descending  colon and sigmoid colon.  Approximate 5.6 cm cyst arising from the left adnexum.  Benign appearance but will likely require further evaluation given the patient is postmenopausal. ____________________________________________   PROCEDURES  Procedure(s) performed:   Procedures  Critical Care performed:   ____________________________________________   INITIAL IMPRESSION / ASSESSMENT AND PLAN / ED COURSE  Pertinent labs & imaging results that were available during my care of the patient were reviewed by me and considered in my medical decision making (see chart for details).  Differential diagnosis includes, but is not limited to, ovarian cyst, ovarian torsion, acute appendicitis, diverticulitis, urinary tract infection/pyelonephritis, endometriosis, bowel obstruction, colitis, renal colic, gastroenteritis, hernia, fibroids, endometriosis, pregnancy related pain including ectopic pregnancy, etc. As part of my medical decision making, I reviewed the following data within the electronic MEDICAL RECORD NUMBER Notes from prior ED  visits  ----------------------------------------- 10:58 PM on 12/04/2017 -----------------------------------------  Patient tolerating p.o. fluids at this time.  Appears comfortable.  Patient says that usually when she has diver reticulitis she is treated with Cipro alone because of her adverse reactions to Flagyl.  I will treat her colitis similarly with Cipro alone.  She will be given a dose here in the emergency department.  She says she is able to tolerate p.o. Cipro but does not do well with IV Cipro.  She will follow-up with her primary care doctor regarding the colitis as well as a left adnexal mass.  She is understanding of these diagnoses as well as treatment plans and willing to comply.  No diarrhea throughout her stay in the emergency department. ____________________________________________   FINAL CLINICAL IMPRESSION(S) / ED DIAGNOSES  Colitis.  Ovarian cyst.    NEW MEDICATIONS STARTED DURING THIS VISIT:  New Prescriptions   No medications on file     Note:  This document was prepared using Dragon voice recognition software and may include unintentional dictation errors.     Orbie Pyo, MD 12/04/17 [phone]

## 2017-12-12 ENCOUNTER — Encounter: Payer: Self-pay | Admitting: Medical

## 2017-12-12 ENCOUNTER — Ambulatory Visit (INDEPENDENT_AMBULATORY_CARE_PROVIDER_SITE_OTHER): Payer: Self-pay | Admitting: Medical

## 2017-12-12 VITALS — BP 123/57 | HR 78 | Temp 97.6°F | Wt 108.0 lb

## 2017-12-12 DIAGNOSIS — H5711 Ocular pain, right eye: Secondary | ICD-10-CM

## 2017-12-12 DIAGNOSIS — H18831 Recurrent erosion of cornea, right eye: Secondary | ICD-10-CM | POA: Diagnosis not present

## 2017-12-12 NOTE — Patient Instructions (Addendum)
Eye Pain   To follow up with [facility]  ( this is her regular eye doctor) [phone]

## 2017-12-12 NOTE — Progress Notes (Signed)
   Subjective:    Patient ID: Jean Brooks, female    DOB: 1946-03-13, 72 y.o.   MRN: [id number]  HPI 72 yo in non acute distress. Comes in with symptoms starting today (woke up due to sharp pain in right eye 7/10) with symptoms of right eye tearing, does not itch and it aches 1-2/10 now. Light seemed to bother the eye when she went from her dark bedroom to the room with light. No colored discharge. No crusting from overnight.Some clear drainage from right sided nostril. No prior eye problems except FB 10 years ago which resolved. Seen last Thurday [facility]-ER for Colitis placed on oral Cipro, follow up on Wednesday with her Primary. Abdominal pain seems better then when she was in the hosptial  rates pain 1-2/10. Last eye exam one year ago with [facility].  Say Cipro is on her list of allergies when she had an IV for Diverticulitis and "they pushed it too fast and "I got a red line from it ". 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   Review of Systems  HENT: Positive for congestion, postnasal drip (has this most of the time) and rhinorrhea (right side). Negative for ear discharge, ear pain and sore throat.   Eyes: Positive for photophobia, pain (dull, sharp pain woke patient up. Varied in degree of sharp and dull pain. dull right now onlyl), discharge (clear only), redness and visual disturbance (tearing causes blurry vision.). Negative for itching.  Respiratory: Positive for cough (mild cough started yesterday, non productive). Negative for shortness of breath.   Cardiovascular: Negative for chest pain, palpitations and leg swelling.  Gastrointestinal: Positive for abdominal pain (improving see narravtive), nausea (sometimes attributes this to her Colitis) and vomiting (last episode last Thursday). Negative for constipation and diarrhea.  Genitourinary: Negative for dysuria.  Musculoskeletal: Negative for myalgias.  Skin: Negative for rash and wound.   Allergic/Immunologic: Positive for environmental allergies and food allergies ( per patient seeds and nuts due to Diverticulitis history).  Neurological: Negative for dizziness, syncope, light-headedness and headaches.  Hematological: Negative for adenopathy.  Psychiatric/Behavioral: Positive for sleep disturbance (woke up by sharp pain in right eye.). Negative for self-injury and suicidal ideas. The patient is not nervous/anxious.        Objective:   Physical Exam  Constitutional:  Petite and fraile looking  HENT:  Head: Normocephalic and atraumatic.  Eyes: EOM are normal. Pupils are equal, round, and reactive to light. Right eye exhibits discharge (clear only). Right eye exhibits no chemosis, no exudate and no hordeolum. Left eye exhibits no discharge. Right conjunctiva is injected. Right conjunctiva has no hemorrhage. Left conjunctiva is injected (mild). Left conjunctiva has no hemorrhage. No scleral icterus. Pupils are equal.  Fundoscopic exam:      The right eye shows red reflex.       The left eye shows red reflex.  Neck: Normal range of motion. Neck supple.  Nursing note and vitals reviewed.  Some sensitivity to light in room per patient.  Mild swelling around eye possibly where patient had been wiping tears away.  With correction.  20/20 right 20/25 left Both 20/20 Assessment & Plan:   Right Eye Pain  And redness with photophobia. Patient allergic to multiple antibiotics and since she woke up with sharp pain will refer her to her eye doctor ( Dr. Edison Pace) at [facility]. Given note for today return to work per their recommendations.

## 2017-12-16 ENCOUNTER — Telehealth: Payer: Self-pay | Admitting: Emergency Medicine

## 2017-12-16 NOTE — Telephone Encounter (Signed)
Spoke with patient doing better still a little pain placed on [address] 128 for 41mo  by [facility].

## 2017-12-17 ENCOUNTER — Ambulatory Visit (INDEPENDENT_AMBULATORY_CARE_PROVIDER_SITE_OTHER): Payer: 59 | Admitting: Internal Medicine

## 2017-12-17 ENCOUNTER — Encounter: Payer: Self-pay | Admitting: Internal Medicine

## 2017-12-17 VITALS — BP 124/76 | HR 62 | Temp 98.2°F | Resp 14 | Ht 62.0 in | Wt 108.8 lb

## 2017-12-17 DIAGNOSIS — I2510 Atherosclerotic heart disease of native coronary artery without angina pectoris: Secondary | ICD-10-CM

## 2017-12-17 DIAGNOSIS — N83202 Unspecified ovarian cyst, left side: Secondary | ICD-10-CM

## 2017-12-17 DIAGNOSIS — K529 Noninfective gastroenteritis and colitis, unspecified: Secondary | ICD-10-CM | POA: Diagnosis not present

## 2017-12-17 DIAGNOSIS — E782 Mixed hyperlipidemia: Secondary | ICD-10-CM | POA: Diagnosis not present

## 2017-12-17 DIAGNOSIS — G4700 Insomnia, unspecified: Secondary | ICD-10-CM

## 2017-12-17 LAB — SEDIMENTATION RATE: SED RATE: 2 mm/h (ref 0–30)

## 2017-12-17 MED ORDER — ONDANSETRON HCL 4 MG PO TABS: 4.0000 mg | ORAL_TABLET | Freq: Three times a day (TID) | ORAL | 0 refills | Status: DC | PRN

## 2017-12-17 MED ORDER — ZOLPIDEM TARTRATE ER 12.5 MG PO TBCR: EXTENDED_RELEASE_TABLET | ORAL | 5 refills | Status: DC

## 2017-12-17 NOTE — Patient Instructions (Signed)
Referral to a femael gyn to follow up on the ovarian cyst  If you have another liquid stool  Return some in the vials provided

## 2017-12-17 NOTE — Progress Notes (Signed)
Subjective:  Patient ID: Jean Brooks, female    DOB: 25-Aug-1946  Age: 72 y.o. MRN: [id number]  CC: The primary encounter diagnosis was Mixed hyperlipidemia. Diagnoses of Colitis, Cyst of left ovary, Insomnia, persistent, and Coronary artery disease involving native coronary artery of native heart without angina pectoris were also pertinent to this visit.  HPI Jean Brooks presents for ER follow up.  Presented on March 7  with  Rash,  Followed by N/VD with delirium, diagnosed with colitis by CT .  Patient had been taking  augmentin( developed rash on Day 4 and stopped the augmentin )  at the time prescribed empirically by Dr Elliot's  PA   for presumed diverticultiitis given her history of intermittent diarrhea   Feeling better but still having the abdominal discomfort  Not sure if it is gas pain.  No diarrhea ,no constipation ,  No blood in stools.   Cautious about eating. She "knows after one bite if she is going to tolerate it" so not eating much .  Has stopped all meds but ambien     Other CT findings reviewed with patient :  6 cm left ovarian cyst.  Needs pelvic ultrasound last one 2016  Still growing . Requesting female gyn   Atherosclerosis : discussed,  Fasting lipids needed  Outpatient Medications Prior to Visit  Medication Sig Dispense Refill  . zolpidem (AMBIEN CR) 12.5 MG CR tablet TAKE 1 TABLET BY MOUTH NIGHTLY AT BEDTIME AS NEEDED FOR SLEEP 30 tablet 5  . Cholecalciferol (VITAMIN D3) 1000 UNITS CAPS Take 1 capsule by mouth daily. Reported on 10/31/2015    . fluconazole (DIFLUCAN) 200 MG tablet   3  . Multiple Vitamin (MULTIVITAMIN) tablet Take 1 tablet by mouth daily.     No facility-administered medications prior to visit.     Review of Systems;  Patient denies headache, fevers, malaise, unintentional weight loss, skin rash, eye pain, sinus congestion and sinus pain, sore throat, dysphagia,  hemoptysis , cough, dyspnea, wheezing, chest pain, palpitations,  orthopnea, edema, abdominal pain, nausea, melena, diarrhea, constipation, flank pain, dysuria, hematuria, urinary  Frequency, nocturia, numbness, tingling, seizures,  Focal weakness, Loss of consciousness,  Tremor, insomnia, depression, anxiety, and suicidal ideation.      Objective:  BP 124/76 (BP Location: Left Arm, Patient Position: Sitting, Cuff Size: Normal)   Pulse 62   Temp 98.2 F (36.8 C) (Oral)   Resp 14   Ht 5\' 2"  (1.575 m)   Wt 108 lb 12.8 oz (49.4 kg)   SpO2 98%   BMI 19.90 kg/m   BP Readings from Last 3 Encounters:  12/17/17 124/76  12/12/17 (!) 123/57  12/04/17 106/63    Wt Readings from Last 3 Encounters:  12/17/17 108 lb 12.8 oz (49.4 kg)  12/12/17 108 lb (49 kg)  12/04/17 112 lb (50.8 kg)    General appearance: alert, cooperative and appears stated age Ears: normal TM's and external ear canals both ears Throat: lips, mucosa, and tongue normal; teeth and gums normal Neck: no adenopathy, no carotid bruit, supple, symmetrical, trachea midline and thyroid not enlarged, symmetric, no tenderness/mass/nodules Back: symmetric, no curvature. ROM normal. No CVA tenderness. Lungs: clear to auscultation bilaterally Heart: regular rate and rhythm, S1, S2 normal, no murmur, click, rub or gallop Abdomen: soft, non-tender; bowel sounds normal; no masses,  no organomegaly Pulses: 2+ and symmetric Skin: Skin color, texture, turgor normal. No rashes or lesions Lymph nodes: Cervical, supraclavicular, and axillary nodes normal.  Lab  Results  Component Value Date   HGBA1C 5.7 05/21/2016    Lab Results  Component Value Date   CREATININE 0.73 12/17/2017   CREATININE 0.87 12/04/2017   CREATININE 0.69 06/27/2017    Lab Results  Component Value Date   WBC 8.7 12/04/2017   HGB 14.6 12/04/2017   HCT 42.9 12/04/2017   PLT 223 12/04/2017   GLUCOSE 99 12/17/2017   CHOL 172 12/17/2017   TRIG 54.0 12/17/2017   HDL 55.70 12/17/2017   LDLCALC 106 (H) 12/17/2017   ALT 12  12/17/2017   AST 17 12/17/2017   NA 141 12/17/2017   K 3.7 12/17/2017   CL 106 12/17/2017   CREATININE 0.73 12/17/2017   BUN 8 12/17/2017   CO2 25 12/17/2017   TSH 1.58 06/27/2017   HGBA1C 5.7 05/21/2016    Ct Abdomen Pelvis W Contrast  Result Date: 12/04/2017 CLINICAL DATA:  Acute onset of nausea, vomiting, diarrhea and crampy abdominal pain that began at 4 o'clock this afternoon. Surgical history includes hysterectomy and appendectomy. EXAM: CT ABDOMEN AND PELVIS WITH CONTRAST TECHNIQUE: Multidetector CT imaging of the abdomen and pelvis was performed using the standard protocol following bolus administration of intravenous contrast. CONTRAST:  64mL ISOVUE-300 IOPAMIDOL INJECTION 61% IV. COMPARISON:  05/23/2008. FINDINGS: Lower chest: Mild LAD coronary atherosclerosis. Normal heart size. Visualized lung bases clear. Hepatobiliary: Liver normal in size and appearance. Gallbladder normal in appearance without calcified gallstones. No biliary ductal dilation. Pancreas: Normal in appearance without evidence of mass, ductal dilation, or inflammation. Spleen: Calcified granuloma in the otherwise normal-appearing spleen. Adrenals/Urinary Tract: Normal appearing adrenal glands. Benign cyst involving the upper pole of the left kidney. No significant focal parenchymal abnormality involving either kidney. No hydronephrosis. No urinary tract calculi. Normal appearing urinary bladder. Stomach/Bowel: Stomach decompressed and normal in appearance. Normal-appearing small bowel. Entire colon relatively decompressed. Hyperemia with enhancement of the mucosa involving the transverse colon, descending colon and sigmoid colon. Sigmoid colon diverticulosis without evidence of acute diverticulitis. Surgically absent appendix. Vascular/Lymphatic: Mild-to-moderate aorto-iliofemoral atherosclerosis without evidence of aneurysm. Normal-appearing portal venous and systemic venous systems. No pathologic lymphadenopathy.  Reproductive: Surgically absent uterus. Approximate 5.6 x 5.3 cm cyst involving the left adnexum. Other: Numerous pelvic phleboliths. Musculoskeletal: Thoracolumbar dextroscoliosis. Multilevel degenerative disc disease, spondylosis and facet degenerative changes. No acute abnormality. IMPRESSION: 1. Findings most likely indicating colitis involving transverse colon, descending colon and sigmoid colon. While sigmoid diverticulosis is present, there is no evidence of acute diverticulitis. 2. Approximate 5.6 cm cyst arising from the left adnexum. While this has a benign appearance, in a postmenopausal female, a non emergent follow-up pelvic ultrasound is recommended in further evaluation. This recommendation follows ACR consensus guidelines: White Paper of the ACR Incidental Findings Committee II on Adnexal Findings. J Am Coll Radiol [id number]. 3.  Aortic Atherosclerosis (ICD10-170.0) Electronically Signed   By: Evangeline Dakin M.D.   On: 12/04/2017 21:15    Assessment & Plan:   Problem List Items Addressed This Visit    Insomnia, persistent    Trial of restoril  And trazodone were not successful  . She has resumed ambien        Hyperlipidemia - Primary    Will treat for LDL > 100 given the LAD atherosclerosis noted on CT       Relevant Orders   Lipid panel (Completed)   Cyst, ovarian    Has been present for several years,  Increasing in size . Referring to Dr Marcelline Mates      Relevant Orders  Ambulatory referral to Gynecology   Coronary artery disease    LAD, noted on recent CT  .  Statin therapy will be advised for LDL > 100       Other Visit Diagnoses    Colitis       Relevant Orders   Comprehensive metabolic panel (Completed)   Sedimentation rate (Completed)   C-reactive protein (Completed)   Gastrointestinal Panel by PCR , Stool      I am having Mardene Celeste C. Lose maintain her multivitamin, Vitamin D3, fluconazole, ondansetron, and zolpidem.  Meds ordered this encounter    Medications  . ondansetron (ZOFRAN) 4 MG tablet    Sig: Take 1 tablet (4 mg total) by mouth every 8 (eight) hours as needed for nausea or vomiting.    Dispense:  20 tablet    Refill:  0  . zolpidem (AMBIEN CR) 12.5 MG CR tablet    Sig: TAKE 1 TABLET BY MOUTH NIGHTLY AT BEDTIME AS NEEDED FOR SLEEP    Dispense:  30 tablet    Refill:  5    Medications Discontinued During This Encounter  Medication Reason  . zolpidem (AMBIEN CR) 12.5 MG CR tablet Reorder    Follow-up: No follow-ups on file.   Crecencio Mc, MD

## 2017-12-18 DIAGNOSIS — I2510 Atherosclerotic heart disease of native coronary artery without angina pectoris: Secondary | ICD-10-CM | POA: Insufficient documentation

## 2017-12-18 LAB — COMPREHENSIVE METABOLIC PANEL
ALBUMIN: 4.4 g/dL (ref 3.5–5.2)
ALK PHOS: 45 U/L (ref 39–117)
ALT: 12 U/L (ref 0–35)
AST: 17 U/L (ref 0–37)
BUN: 8 mg/dL (ref 6–23)
CHLORIDE: 106 meq/L (ref 96–112)
CO2: 25 mEq/L (ref 19–32)
CREATININE: 0.73 mg/dL (ref 0.40–1.20)
Calcium: 9.6 mg/dL (ref 8.4–10.5)
GFR: 83.65 mL/min (ref >60.00)
Glucose, Bld: 99 mg/dL (ref 70–99)
POTASSIUM: 3.7 meq/L (ref 3.5–5.1)
Sodium: 141 mEq/L (ref 135–145)
TOTAL PROTEIN: 6.7 g/dL (ref 6.0–8.3)
Total Bilirubin: 0.2 mg/dL (ref 0.2–1.2)

## 2017-12-18 LAB — LIPID PANEL
CHOLESTEROL: 172 mg/dL (ref 0–200)
HDL: 55.7 mg/dL (ref >39.00)
LDL CALC: 106 mg/dL — AB (ref 0–99)
NonHDL: 116.35
Total CHOL/HDL Ratio: 3
Triglycerides: 54 mg/dL (ref 0.0–149.0)
VLDL: 10.8 mg/dL (ref 0.0–40.0)

## 2017-12-18 LAB — C-REACTIVE PROTEIN

## 2017-12-18 NOTE — Assessment & Plan Note (Signed)
LAD, noted on recent CT  .  Statin therapy will be advised for LDL > 100

## 2017-12-18 NOTE — Assessment & Plan Note (Signed)
Has been present for several years,  Increasing in size . Referring to Dr Marcelline Mates

## 2017-12-18 NOTE — Assessment & Plan Note (Signed)
Trial of restoril  And trazodone were not successful  . She has resumed [place]

## 2017-12-18 NOTE — Assessment & Plan Note (Signed)
Will treat for LDL > 100 given the LAD atherosclerosis noted on CT

## 2017-12-31 ENCOUNTER — Ambulatory Visit: Payer: Self-pay | Admitting: Internal Medicine

## 2018-01-08 DIAGNOSIS — N83202 Unspecified ovarian cyst, left side: Secondary | ICD-10-CM | POA: Diagnosis not present

## 2018-01-13 DIAGNOSIS — N83202 Unspecified ovarian cyst, left side: Secondary | ICD-10-CM | POA: Diagnosis not present

## 2018-01-21 DIAGNOSIS — R300 Dysuria: Secondary | ICD-10-CM | POA: Diagnosis not present

## 2018-02-04 DIAGNOSIS — H6123 Impacted cerumen, bilateral: Secondary | ICD-10-CM | POA: Diagnosis not present

## 2018-02-04 DIAGNOSIS — J0100 Acute maxillary sinusitis, unspecified: Secondary | ICD-10-CM | POA: Diagnosis not present

## 2018-02-09 DIAGNOSIS — Z01818 Encounter for other preprocedural examination: Secondary | ICD-10-CM | POA: Diagnosis not present

## 2018-02-09 DIAGNOSIS — N83202 Unspecified ovarian cyst, left side: Secondary | ICD-10-CM | POA: Diagnosis not present

## 2018-02-13 ENCOUNTER — Other Ambulatory Visit: Payer: Self-pay

## 2018-02-13 ENCOUNTER — Encounter
Admission: RE | Admit: 2018-02-13 | Discharge: 2018-02-13 | Disposition: A | Discharge status: Home / Self Care | Payer: 59 | Source: Ambulatory Visit | Attending: Obstetrics & Gynecology | Admitting: Obstetrics & Gynecology

## 2018-02-13 DIAGNOSIS — Z01810 Encounter for preprocedural cardiovascular examination: Secondary | ICD-10-CM | POA: Diagnosis not present

## 2018-02-13 DIAGNOSIS — Z01818 Encounter for other preprocedural examination: Secondary | ICD-10-CM | POA: Insufficient documentation

## 2018-02-13 HISTORY — DX: Other complications of anesthesia, initial encounter: T88.59XA

## 2018-02-13 HISTORY — DX: Family history of other specified conditions: Z84.89

## 2018-02-13 HISTORY — DX: Other specified postprocedural states: R11.2

## 2018-02-13 HISTORY — DX: Nausea with vomiting, unspecified: R11.2

## 2018-02-13 HISTORY — DX: Unspecified osteoarthritis, unspecified site: M19.90

## 2018-02-13 HISTORY — DX: Adverse effect of unspecified anesthetic, initial encounter: T41.45XA

## 2018-02-13 HISTORY — DX: Other specified postprocedural states: Z98.890

## 2018-02-13 LAB — BASIC METABOLIC PANEL
ANION GAP: 7 (ref 5–15)
BUN: 19 mg/dL (ref 6–20)
CO2: 27 mmol/L (ref 22–32)
Calcium: 9.3 mg/dL (ref 8.9–10.3)
Chloride: 103 mmol/L (ref 101–111)
Creatinine, Ser: 0.72 mg/dL (ref 0.44–1.00)
GFR calc non Af Amer: >60 mL/min (ref >60)
Glucose, Bld: 95 mg/dL (ref 65–99)
Potassium: 3.7 mmol/L (ref 3.5–5.1)
Sodium: 137 mmol/L (ref 135–145)

## 2018-02-13 LAB — CBC
HEMATOCRIT: 42.6 % (ref 35.0–47.0)
Hemoglobin: 14.4 g/dL (ref 12.0–16.0)
MCH: 30.1 pg (ref 26.0–34.0)
MCHC: 33.7 g/dL (ref 32.0–36.0)
MCV: 89.2 fL (ref 80.0–100.0)
Platelets: 256 10*3/uL (ref 150–440)
RBC: 4.77 MIL/uL (ref 3.80–5.20)
RDW: 13.6 % (ref 11.5–14.5)
WBC: 6.2 10*3/uL (ref 3.6–11.0)

## 2018-02-13 LAB — TYPE AND SCREEN
ABO/RH(D): A POS
Antibody Screen: NEGATIVE

## 2018-02-13 NOTE — Patient Instructions (Signed)
Your procedure is scheduled on: 02-20-18 FRIDAY Report to Same Day Surgery 2nd floor medical mall [facility] Entrance-take elevator on left to 2nd floor.  Check in with surgery information desk.) To find out your arrival time please call [phone] between 1PM - 3PM on 02-19-18 THURSDAY  Remember: Instructions that are not followed completely may result in serious medical risk, up to and including death, or upon the discretion of your surgeon and anesthesiologist your surgery may need to be rescheduled.    _x___ 1. Do not eat food after midnight the night before your procedure. NO GUM OR CANDY AFTER MIDNIGHT.  You may drink clear liquids up to 2 hours before you are scheduled to arrive at the hospital for your procedure.  Do not drink clear liquids within 2 hours of your scheduled arrival to the hospital.  Clear liquids include  --Water or Apple juice without pulp  --Clear carbohydrate beverage such as ClearFast or Gatorade  --Black Coffee or Clear Tea (No milk, no creamers, do not add anything to the coffee or Tea     __x__ 2. No Alcohol for 24 hours before or after surgery.   __x__3. No Smoking or e-cigarettes for 24 prior to surgery.  Do not use any chewable tobacco products for at least 6 hour prior to surgery   ____  4. Bring all medications with you on the day of surgery if instructed.    __x__ 5. Notify your doctor if there is any change in your medical condition     (cold, fever, infections).    x___6. On the morning of surgery brush your teeth with toothpaste and water.  You may rinse your mouth with mouth wash if you wish.  Do not swallow any toothpaste or mouthwash.   Do not wear jewelry, make-up, hairpins, clips or nail polish.  Do not wear lotions, powders, or perfumes. You may wear deodorant.  Do not shave 48 hours prior to surgery. Men may shave face and neck.  Do not bring valuables to the hospital.    [facility] is not responsible for any belongings or  valuables.               Contacts, dentures or bridgework may not be worn into surgery.  Leave your suitcase in the car. After surgery it may be brought to your room.  For patients admitted to the hospital, discharge time is determined by your treatment team.  _  Patients discharged the day of surgery will not be allowed to drive home.  You will need someone to drive you home and stay with you the night of your procedure.    Please read over the following fact sheets that you were given:   [facility] Preparing for Surgery and or MRSA Information   ____ Take anti-hypertensive listed below, cardiac, seizure, asthma, anti-reflux and psychiatric medicines. These include:  1. NONE  2.  3.  4.  5.  6.  ____Fleets enema or Magnesium Citrate as directed.   _x___ Use CHG Soap or sage wipes as directed on instruction sheet   ____ Use inhalers on the day of surgery and bring to hospital day of surgery  ____ Stop Metformin and Janumet 2 days prior to surgery.    ____ Take 1/2 of usual insulin dose the night before surgery and none on the morning surgery.   ____ Follow recommendations from Cardiologist, Pulmonologist or PCP regarding stopping Aspirin, Coumadin, Plavix ,Eliquis, Effient, or Pradaxa, and Pletal.  X____Stop  Anti-inflammatories such as Advil, Aleve, Ibuprofen, Motrin, Naproxen, Naprosyn, Goodies powders or aspirin products NOW-OK to take Tylenol    ____ Stop supplements until after surgery.     ____ Bring C-Pap to the hospital.

## 2018-02-19 ENCOUNTER — Encounter: Payer: Self-pay | Admitting: *Deleted

## 2018-02-20 ENCOUNTER — Ambulatory Visit: Payer: 59 | Admitting: Anesthesiology

## 2018-02-20 ENCOUNTER — Other Ambulatory Visit: Payer: Self-pay

## 2018-02-20 ENCOUNTER — Ambulatory Visit
Admission: RE | Admit: 2018-02-20 | Discharge: 2018-02-20 | Disposition: A | Discharge status: Home / Self Care | Payer: 59 | Source: Ambulatory Visit | Attending: Obstetrics & Gynecology | Admitting: Obstetrics & Gynecology

## 2018-02-20 ENCOUNTER — Encounter: Payer: Self-pay | Admitting: *Deleted

## 2018-02-20 ENCOUNTER — Encounter
Admission: RE | Disposition: A | Discharge status: Home / Self Care | Payer: Self-pay | Source: Ambulatory Visit | Attending: Obstetrics & Gynecology

## 2018-02-20 DIAGNOSIS — M4184 Other forms of scoliosis, thoracic region: Secondary | ICD-10-CM | POA: Diagnosis not present

## 2018-02-20 DIAGNOSIS — D279 Benign neoplasm of unspecified ovary: Secondary | ICD-10-CM | POA: Diagnosis not present

## 2018-02-20 DIAGNOSIS — Z8719 Personal history of other diseases of the digestive system: Secondary | ICD-10-CM | POA: Diagnosis not present

## 2018-02-20 DIAGNOSIS — D270 Benign neoplasm of right ovary: Secondary | ICD-10-CM | POA: Diagnosis not present

## 2018-02-20 DIAGNOSIS — Z882 Allergy status to sulfonamides status: Secondary | ICD-10-CM | POA: Diagnosis not present

## 2018-02-20 DIAGNOSIS — N838 Other noninflammatory disorders of ovary, fallopian tube and broad ligament: Secondary | ICD-10-CM | POA: Diagnosis not present

## 2018-02-20 DIAGNOSIS — Z79899 Other long term (current) drug therapy: Secondary | ICD-10-CM | POA: Diagnosis not present

## 2018-02-20 DIAGNOSIS — D271 Benign neoplasm of left ovary: Secondary | ICD-10-CM | POA: Insufficient documentation

## 2018-02-20 DIAGNOSIS — Z880 Allergy status to penicillin: Secondary | ICD-10-CM | POA: Diagnosis not present

## 2018-02-20 DIAGNOSIS — M19041 Primary osteoarthritis, right hand: Secondary | ICD-10-CM | POA: Diagnosis not present

## 2018-02-20 DIAGNOSIS — Z881 Allergy status to other antibiotic agents status: Secondary | ICD-10-CM | POA: Insufficient documentation

## 2018-02-20 DIAGNOSIS — M19042 Primary osteoarthritis, left hand: Secondary | ICD-10-CM | POA: Insufficient documentation

## 2018-02-20 DIAGNOSIS — I2510 Atherosclerotic heart disease of native coronary artery without angina pectoris: Secondary | ICD-10-CM | POA: Diagnosis not present

## 2018-02-20 DIAGNOSIS — Z888 Allergy status to other drugs, medicaments and biological substances status: Secondary | ICD-10-CM | POA: Insufficient documentation

## 2018-02-20 DIAGNOSIS — Z8249 Family history of ischemic heart disease and other diseases of the circulatory system: Secondary | ICD-10-CM | POA: Insufficient documentation

## 2018-02-20 DIAGNOSIS — Z886 Allergy status to analgesic agent status: Secondary | ICD-10-CM | POA: Insufficient documentation

## 2018-02-20 DIAGNOSIS — N839 Noninflammatory disorder of ovary, fallopian tube and broad ligament, unspecified: Secondary | ICD-10-CM | POA: Diagnosis present

## 2018-02-20 HISTORY — PX: LAPAROSCOPIC LYSIS OF ADHESIONS: SHX5905

## 2018-02-20 HISTORY — DX: Atherosclerotic heart disease of native coronary artery without angina pectoris: I25.10

## 2018-02-20 HISTORY — PX: LAPAROSCOPIC BILATERAL SALPINGO OOPHERECTOMY: SHX5890

## 2018-02-20 LAB — ABO/RH: ABO/RH(D): A POS

## 2018-02-20 SURGERY — SALPINGO-OOPHORECTOMY, BILATERAL, LAPAROSCOPIC
Anesthesia: General | Laterality: Bilateral

## 2018-02-20 MED ORDER — GABAPENTIN 300 MG PO CAPS
600.0000 mg | ORAL_CAPSULE | Freq: Once | ORAL | Status: AC
  Administered 2018-02-20: 600 mg via ORAL

## 2018-02-20 MED ORDER — FENTANYL CITRATE (PF) 100 MCG/2ML IJ SOLN: 25.0000 ug | INTRAMUSCULAR | Status: DC | PRN

## 2018-02-20 MED ORDER — MIDAZOLAM HCL 2 MG/2ML IJ SOLN
INTRAMUSCULAR | Status: AC
  Filled 2018-02-20: qty 2

## 2018-02-20 MED ORDER — FAMOTIDINE 20 MG PO TABS
20.0000 mg | ORAL_TABLET | Freq: Once | ORAL | Status: AC
  Administered 2018-02-20: 20 mg via ORAL

## 2018-02-20 MED ORDER — LIDOCAINE HCL (CARDIAC) PF 100 MG/5ML IV SOSY
PREFILLED_SYRINGE | INTRAVENOUS | Status: DC | PRN
  Administered 2018-02-20: 80 mg via INTRAVENOUS

## 2018-02-20 MED ORDER — MIDAZOLAM HCL 2 MG/2ML IJ SOLN
INTRAMUSCULAR | Status: DC | PRN
  Administered 2018-02-20: 2 mg via INTRAVENOUS

## 2018-02-20 MED ORDER — SUGAMMADEX SODIUM 200 MG/2ML IV SOLN
INTRAVENOUS | Status: DC | PRN
  Administered 2018-02-20: 94.8 mg via INTRAVENOUS

## 2018-02-20 MED ORDER — FENTANYL CITRATE (PF) 100 MCG/2ML IJ SOLN
INTRAMUSCULAR | Status: DC | PRN
  Administered 2018-02-20: 25 ug via INTRAVENOUS
  Administered 2018-02-20: 50 ug via INTRAVENOUS
  Administered 2018-02-20: 25 ug via INTRAVENOUS
  Administered 2018-02-20: 50 ug via INTRAVENOUS

## 2018-02-20 MED ORDER — ONDANSETRON HCL 4 MG/2ML IJ SOLN: 4.0000 mg | Freq: Once | INTRAMUSCULAR | Status: DC | PRN

## 2018-02-20 MED ORDER — GABAPENTIN 300 MG PO CAPS: 300.0000 mg | ORAL_CAPSULE | ORAL | 2 refills | Status: DC

## 2018-02-20 MED ORDER — SUCCINYLCHOLINE CHLORIDE 20 MG/ML IJ SOLN
INTRAMUSCULAR | Status: DC | PRN
  Administered 2018-02-20: 100 mg via INTRAVENOUS

## 2018-02-20 MED ORDER — LACTATED RINGERS IV SOLN
INTRAVENOUS | Status: DC
  Administered 2018-02-20 (×2): via INTRAVENOUS

## 2018-02-20 MED ORDER — PROPOFOL 10 MG/ML IV BOLUS
INTRAVENOUS | Status: DC | PRN
  Administered 2018-02-20: 70 mg via INTRAVENOUS

## 2018-02-20 MED ORDER — ROCURONIUM BROMIDE 100 MG/10ML IV SOLN
INTRAVENOUS | Status: DC | PRN
  Administered 2018-02-20: 40 mg via INTRAVENOUS
  Administered 2018-02-20: 10 mg via INTRAVENOUS

## 2018-02-20 MED ORDER — DEXAMETHASONE SODIUM PHOSPHATE 10 MG/ML IJ SOLN
INTRAMUSCULAR | Status: DC | PRN
  Administered 2018-02-20: 10 mg via INTRAVENOUS

## 2018-02-20 MED ORDER — OXYCODONE HCL 5 MG PO TABS: 5.0000 mg | ORAL_TABLET | ORAL | 0 refills | Status: DC | PRN

## 2018-02-20 MED ORDER — ACETAMINOPHEN 500 MG PO TABS
1000.0000 mg | ORAL_TABLET | Freq: Once | ORAL | Status: AC
  Administered 2018-02-20: 1000 mg via ORAL

## 2018-02-20 MED ORDER — PROPOFOL 10 MG/ML IV BOLUS
INTRAVENOUS | Status: AC
  Filled 2018-02-20: qty 20

## 2018-02-20 MED ORDER — FENTANYL CITRATE (PF) 100 MCG/2ML IJ SOLN
INTRAMUSCULAR | Status: AC
  Filled 2018-02-20: qty 2

## 2018-02-20 MED ORDER — ONDANSETRON HCL 4 MG/2ML IJ SOLN
INTRAMUSCULAR | Status: DC | PRN
  Administered 2018-02-20 (×2): 4 mg via INTRAVENOUS

## 2018-02-20 MED ORDER — EPHEDRINE SULFATE 50 MG/ML IJ SOLN
INTRAMUSCULAR | Status: DC | PRN
  Administered 2018-02-20: 10 mg via INTRAVENOUS

## 2018-02-20 SURGICAL SUPPLY — 41 items
BAG URINE DRAINAGE (UROLOGICAL SUPPLIES) ×3 IMPLANT
BLADE SURG SZ11 CARB STEEL (BLADE) ×3 IMPLANT
CANISTER SUCT 1200ML W/VALVE (MISCELLANEOUS) ×3 IMPLANT
CATH FOLEY 2WAY  5CC 16FR (CATHETERS) ×1
CATH URTH 16FR FL 2W BLN LF (CATHETERS) ×2 IMPLANT
CHLORAPREP W/TINT 26ML (MISCELLANEOUS) ×3 IMPLANT
CNTNR SPEC 2.5X3XGRAD LEK (MISCELLANEOUS) ×2
CONT SPEC 4OZ STER OR WHT (MISCELLANEOUS) ×1
CONTAINER SPEC 2.5X3XGRAD LEK (MISCELLANEOUS) ×2 IMPLANT
DERMABOND ADVANCED (GAUZE/BANDAGES/DRESSINGS)
DERMABOND ADVANCED .7 DNX12 (GAUZE/BANDAGES/DRESSINGS) IMPLANT
DRAPE LEGGINS SURG 28X43 STRL (DRAPES) ×3 IMPLANT
DRAPE UNDER BUTTOCK W/FLU (DRAPES) ×3 IMPLANT
GLOVE PI ORTHOPRO 6.5 (GLOVE) ×1
GLOVE PI ORTHOPRO STRL 6.5 (GLOVE) ×2 IMPLANT
GLOVE SURG SYN 6.5 ES PF (GLOVE) ×3 IMPLANT
GOWN STRL REUS W/ TWL LRG LVL3 (GOWN DISPOSABLE) ×4 IMPLANT
GOWN STRL REUS W/ TWL XL LVL3 (GOWN DISPOSABLE) ×2 IMPLANT
GOWN STRL REUS W/TWL LRG LVL3 (GOWN DISPOSABLE) ×2
GOWN STRL REUS W/TWL XL LVL3 (GOWN DISPOSABLE) ×1
IRRIGATION STRYKERFLOW (MISCELLANEOUS) IMPLANT
IRRIGATOR STRYKERFLOW (MISCELLANEOUS)
IV LACTATED RINGERS 1000ML (IV SOLUTION) ×3 IMPLANT
KIT PINK PAD W/HEAD ARE REST (MISCELLANEOUS) ×3
KIT PINK PAD W/HEAD ARM REST (MISCELLANEOUS) ×2 IMPLANT
KIT TURNOVER CYSTO (KITS) ×3 IMPLANT
LABEL OR SOLS (LABEL) IMPLANT
LIGASURE VESSEL 5MM BLUNT TIP (ELECTROSURGICAL) IMPLANT
NS IRRIG 500ML POUR BTL (IV SOLUTION) ×3 IMPLANT
PACK LAP CHOLECYSTECTOMY (MISCELLANEOUS) ×3 IMPLANT
PAD OB MATERNITY 4.3X12.25 (PERSONAL CARE ITEMS) ×3 IMPLANT
PAD PREP 24X41 OB/GYN DISP (PERSONAL CARE ITEMS) ×3 IMPLANT
POUCH SPECIMEN RETRIEVAL 10MM (ENDOMECHANICALS) ×3 IMPLANT
SLEEVE ENDOPATH XCEL 5M (ENDOMECHANICALS) ×6 IMPLANT
SUT MNCRL 4-0 (SUTURE) ×1
SUT MNCRL 4-0 27XMFL (SUTURE) ×2
SUT MNCRL AB 4-0 PS2 18 (SUTURE) ×3 IMPLANT
SUTURE MNCRL 4-0 27XMF (SUTURE) ×2 IMPLANT
TROCAR ENDO BLADELESS 11MM (ENDOMECHANICALS) ×6 IMPLANT
TROCAR XCEL NON-BLD 5MMX100MML (ENDOMECHANICALS) ×3 IMPLANT
TUBING INSUFFLATION (TUBING) ×3 IMPLANT

## 2018-02-20 NOTE — H&P (Signed)
Preoperative History and Physical  Jean Brooks is a 72 y.o.  here for surgical management of 5cm ovarian cyst.   No significant preoperative concerns.  Cyst was noted in 2016 and has been relatively stable, with ~1cm growth in the last 2.5 years.  CA 125 = 8  Proposed surgery: laparoscopic bilateral BSO  Past Medical History:  Diagnosis Date  . Arthritis    bil hands/thumbs  . Chest pain, non-cardiac 2010   a. stress echo 2010: nl treadmill EKG w/o evidence of ischemia or arrhythmia, good exercise tolerance for age, normal stress echo images w/o evidence of myocardial ischemia; b. baseline echo with EF 55% peak stress showed nl LV systolic function wtih EF 65% w/o evidence of HK  . Complication of anesthesia   . Coronary artery disease   . Diverticulitis of colon with hemorrhage August 2009   Hospitalized [facility]  . Family history of adverse reaction to anesthesia    mom and maternal grandmother-agitated  . H/O syncope    a. echo 2010: EF 60%, LV nl size, RV nl size & fxn, atria nl size, aorta nl, no pericardial effusion, aortic valve nl, mitral valve nl w/ mild insufficiency, tricuspid valve nl w/ mild insufficiency, pulmonic valve nl  . Heart murmur    asymptomatic-in pt's 20's  . Migraine headache    h/o migraines  . PONV (postoperative nausea and vomiting)   . Scoliosis of thoracic spine   . Shingles    Past Surgical History:  Procedure Laterality Date  . ABDOMINAL HYSTERECTOMY  1991  . APPENDECTOMY  1963  . COLONOSCOPY    . ESOPHAGOGASTRODUODENOSCOPY    . TONSILLECTOMY     age 19 or 6   OB History  No data available  Patient denies any other pertinent gynecologic issues.   No current facility-administered medications on file prior to encounter.    Current Outpatient Medications on File Prior to Encounter  Medication Sig Dispense Refill  . Cholecalciferol (VITAMIN D3) 1000 UNITS CAPS Take 1 capsule by mouth every other day. Alternate with the magnesium for leg pain     . Magnesium 250 MG TABS Take 250 mg by mouth every other day. Alternate with vitamin d 3 for leg cramps    . sodium chloride (MURO 128) 5 % ophthalmic ointment Place 1 application into the right eye at bedtime.    Marland Kitchen zolpidem (AMBIEN CR) 12.5 MG CR tablet TAKE 1 TABLET BY MOUTH NIGHTLY AT BEDTIME AS NEEDED FOR SLEEP 30 tablet 5  . ondansetron (ZOFRAN) 4 MG tablet Take 1 tablet (4 mg total) by mouth every 8 (eight) hours as needed for nausea or vomiting. (Patient not taking: Reported on 02/10/2018) 20 tablet 0   Allergies  Allergen Reactions  . Sulfa Antibiotics Hives  . Flagyl [Metronidazole] Other (See Comments)    Can't remember but remembers it was not a good experience  . Aspirin Rash  . Azithromycin Rash    Within the hour  . Ciprofloxacin Nausea Only and Rash    After 3 day  . Erythromycin Rash    Confirmed Feb 2019  . Ibuprofen Rash  . Nitrofurantoin Rash  . Penicillins Rash    Has patient had a PCN reaction causing immediate rash, facial/tongue/throat swelling, SOB or lightheadedness with hypotension: Yes Has patient had a PCN reaction causing severe rash involving mucus membranes or skin necrosis: No Has patient had a PCN reaction that required hospitalization: No Has patient had a PCN reaction occurring within the  last 10 years: No If all of the above answers are "NO", then may proceed with Cephalosporin use.     Social History:   reports that she has never smoked. She has never used smokeless tobacco. She reports that she does not drink alcohol or use drugs.  Family History  Problem Relation Age of Onset  . COPD Mother 63       respiratory failure/fibrosis, seizures  . Stroke Mother   . Hypertension Mother   . Supraventricular tachycardia Mother   . COPD Father 84  . Mental illness Father        Parkinson's Dementia  . Aneurysm Father 43  . Stroke Son 31       hypertensive, left brain   . Heart attack Maternal Grandfather 55       passed  . Stroke Maternal  Grandmother   . Breast cancer Maternal Aunt     Review of Systems: Noncontributory  PHYSICAL EXAM: Blood pressure 121/78, pulse 68, temperature 98 F (36.7 C), temperature source Oral, resp. rate 16, height 5\' 3"  (1.6 m), weight 47.4 kg (104 lb 7 oz), SpO2 100 %. General appearance - alert, well appearing, and in no distress Chest - clear to auscultation, no wheezes, rales or rhonchi, symmetric air entry Heart - normal rate and regular rhythm Abdomen - soft, nontender, nondistended, no masses or organomegaly Pelvic - examination not indicated Extremities - peripheral pulses normal, no pedal edema, no clubbing or cyanosis  Labs: Results for orders placed or performed during the hospital encounter of 02/13/18 (from the past 336 hour(s))  CBC   Collection Time: 02/13/18 12:01 PM  Result Value Ref Range   WBC 6.2 3.6 - 11.0 K/uL   RBC 4.77 3.80 - 5.20 MIL/uL   Hemoglobin 14.4 12.0 - 16.0 g/dL   HCT 42.6 35.0 - 47.0 %   MCV 89.2 80.0 - 100.0 fL   MCH 30.1 26.0 - 34.0 pg   MCHC 33.7 32.0 - 36.0 g/dL   RDW 13.6 11.5 - 14.5 %   Platelets 256 150 - 440 K/uL  Basic metabolic panel   Collection Time: 02/13/18 12:01 PM  Result Value Ref Range   Sodium 137 135 - 145 mmol/L   Potassium 3.7 3.5 - 5.1 mmol/L   Chloride 103 101 - 111 mmol/L   CO2 27 22 - 32 mmol/L   Glucose, Bld 95 65 - 99 mg/dL   BUN 19 6 - 20 mg/dL   Creatinine, Ser 0.72 0.44 - 1.00 mg/dL   Calcium 9.3 8.9 - 10.3 mg/dL   GFR calc non Af Amer >60 >60 mL/min   GFR calc Af Amer >60 >60 mL/min   Anion gap 7 5 - 15  Type and screen [place]   Collection Time: 02/13/18 12:01 PM  Result Value Ref Range   ABO/RH(D) A POS    Antibody Screen NEG    Sample Expiration 02/27/2018    Extend sample reason      NO TRANSFUSIONS OR PREGNANCY IN THE PAST 3 MONTHS Performed at [facility], [address], [place],  [address]     Imaging Studies: No results found.  Assessment: Patient  Active Problem List   Diagnosis Date Noted  . Coronary artery disease 12/18/2017  . Lymphedema 06/25/2017  . Chronic venous insufficiency 06/25/2017  . Varicose veins of leg with pain, bilateral 05/16/2017  . Stenosis of cervical spine 01/02/2017  . Hyperlipidemia 06/27/2016  . Injection site reaction 06/16/2015  .  Osteopenia 06/14/2015  . SVT (supraventricular tachycardia) ([place]) 06/07/2014  . Encounter for preventive health examination 03/24/2013  . Dyspareunia, female 03/23/2013  . Constipation 10/06/2012  . Cyst, ovarian 12/29/2011  . Diverticulitis of colon with hemorrhage   . Migraine headache   . Scoliosis of thoracic spine   . Syncope and collapse   . Insomnia, persistent 12/25/2011  . Chest pain, non-cardiac     Plan: Patient will undergo surgical management with laparoscopic BSO.   The risks of surgery were discussed in detail with the patient including but not limited to: bleeding which may require transfusion or reoperation; infection which may require antibiotics; injury to surrounding organs which may involve bowel, bladder, ureters ; need for additional procedures including laparoscopy or laparotomy; thromboembolic phenomenon, surgical site problems and other postoperative/anesthesia complications. Likelihood of success in alleviating the patient's condition was discussed. Routine postoperative instructions will be reviewed with the patient and her family in detail after surgery.  The patient concurred with the proposed plan, giving informed written consent for the surgery.  Patient has been NPO since last night she will remain NPO for procedure.  Anesthesia and OR aware.  To OR when ready.  ----- Larey Days, MD Attending Obstetrician and Gynecologist [facility], Department of [facility]

## 2018-02-20 NOTE — OR Nursing (Signed)
Patient is very sleepy

## 2018-02-20 NOTE — Op Note (Signed)
[facility] PROCEDURE DATE: 02/20/2018  PATIENT:  Jean Brooks  72 y.o. female  PRE-OPERATIVE DIAGNOSIS:  Ovarian mass  POST-OPERATIVE DIAGNOSIS:  Ovarian mass  PROCEDURE:  Procedure(s): LAPAROSCOPIC BILATERAL SALPINGO OOPHORECTOMY (Bilateral) LAPAROSCOPIC LYSIS OF ADHESIONS  SURGEON:  Surgeon(s) and Role:    * Ward, Honor Loh, MD - Primary  ANESTHESIA:  General via ET  I/O  1200cc crystalloid, EBL minimal, UOP 300cc  FINDINGS:  Normal upper abdomen, adhesions of omentum to right lower abdomen to midline, likely from previous open appendectomy  Right tube and ovary peritonealized, normal appearing.  Left ovary adherent to pelvic sidewall to vaginal cuff.  Left fallopian tube with 5-6cm paratubal cyst.  SPECIMEN: right tube and ovary, left tube and cyst, left ovary.  Pelvic washings  COMPLICATIONS: none apparent  DISPOSITION: vital signs stable to PACU   Indication for Surgery: 72 y.o. with left pelvic mass, noted to be simple appearing cyst, 5-6cm, and CA 125 of 8. This mass has shown relative stability since 2016, and options were given of observation vs removal.  Patient elected for removal.  Risks of surgery were discussed with the patient including but not limited to: bleeding which may require transfusion or reoperation; infection which may require antibiotics; injury to bowel, bladder, ureters or other surrounding organs; need for additional procedures including laparotomy, blood clot, incisional problems and other postoperative/anesthesia complications. Written informed consent was obtained.      PROCEDURE IN DETAIL:  The patient had sequential compression devices applied to her lower extremities while in the preoperative area.  She was then taken to the operating room where general anesthesia was administered and was found to be adequate.  She was placed in the dorsal lithotomy position, and was prepped and draped in a sterile manner.  A Foley catheter was inserted  into her bladder and attached to constant drainage.  After a surgical timeout was performed, attention was turned to the abdomen where an umbilical incision was made with the scalpel. A 24mm trochar was inserted in the umbilical incision using a visiport method. Opening pressure was 23mmHg, and the abdomen was insufflated to 15mg Hg carbon dioxide gas and adequate pneumoperitoneum was obtained.  A survey of the patient's pelvis and abdomen revealed the findings as mentioned above. One 57mm port in the LLQ and one suprapubic 52mm port were inserted under visualization.     The Ligasure was used to ligate the omental adhesions in the right lower quadrant.  The adnexal mass was noted to be arising from the mesosalpinx of the left fallopian tube. The Ligasure was used for blunt, sharp, and thermal dissection and ligation for the entire procedure.  The right pelvic peritoneum was entered, and divided cephalad to expose the right ureter and IP ligament.  The IP was skeletonized and thrice cauterized, then transected.  The tubal/ovarian structure was drawn medially and the underlying peritoneum was divided.  The ureter was exposed and traced to the insertion into the bladder.  The left tubal structure was found to be isolated from the left ovary.  The peritoneum on the left was entered and divided to expose the ureter.  The left fallopian tube was cauterized and transected at its association with the pelvic side wall.  The left ovary was adherent along the curve of the pelvis to the vaginal cuff.  The IP was identified and skeletonized, isolating it from the nearby ureter.  The IP was thrice cauterized and transected.  Ureterolysis was performed to ensure no thermal spread  or injury to the ureter during the removal of the ovary.  The peritoneum was divided bluntly and sharply, with occasional use of cautery when deemed safe and away from the pelvic sidewall.  Careful dissection of this area was performed, with the ureter  in sight.  The right ureter was again identified and traced to the insertion of the bladder, away from the adhesions of the ovary to the cuff.  The adhesions were finally transected and the left tube, ovary, and right tube and ovary were placed in an endocatch bag.    The intrabdominal pressure was lowered to 1mmHg and all sites were hemostatic. No intraoperative injury to surrounding organs was noted. The 55mm port was removed and the bag brought through.  The insertion site was further divided to accomodate the specimens, which were removed through the bag: the cyst was purposely ruptured in the bag without spillage.  The bag was removed from the abdomen, the remaining trochars were removed atraumatically.  The fascia of the suprapubic site was closed with a running stitch of 2-0 vicryl.  All skin incisions were closed with 4-0 monocryl and covered with surgical glue. The patient tolerated the procedures well.  All instruments, needles, and sponge counts were correct x 2. The patient was taken to the recovery room in stable condition.   ---- Larey Days, MD Attending Obstetrician and [facility]

## 2018-02-20 NOTE — Anesthesia Preprocedure Evaluation (Signed)
Anesthesia Evaluation  Patient identified by MRN, date of birth, ID band Patient awake    Reviewed: Allergy & Precautions, NPO status , Patient's Chart, lab work & pertinent test results  History of Anesthesia Complications (+) PONV and history of anesthetic complications  Airway Mallampati: III  TM Distance: <3 FB     Dental   Pulmonary neg pulmonary ROS,    Pulmonary exam normal        Cardiovascular Normal cardiovascular exam+ Valvular Problems/Murmurs      Neuro/Psych  Headaches, negative psych ROS   GI/Hepatic Neg liver ROS, diverticulosis   Endo/Other  negative endocrine ROS  Renal/GU negative Renal ROS  negative genitourinary   Musculoskeletal  (+) Arthritis ,   Abdominal Normal abdominal exam  (+)   Peds negative pediatric ROS (+)  Hematology negative hematology ROS (+)   Anesthesia Other Findings   Reproductive/Obstetrics                             Anesthesia Physical Anesthesia Plan  ASA: II  Anesthesia Plan: General   Post-op Pain Management:    Induction: Intravenous  PONV Risk Score and Plan:   Airway Management Planned: Oral ETT  Additional Equipment:   Intra-op Plan:   Post-operative Plan: Extubation in OR  Informed Consent: I have reviewed the patients History and Physical, chart, labs and discussed the procedure including the risks, benefits and alternatives for the proposed anesthesia with the patient or authorized representative who has indicated his/her understanding and acceptance.   Dental advisory given  Plan Discussed with: CRNA and Surgeon  Anesthesia Plan Comments:         Anesthesia Quick Evaluation

## 2018-02-20 NOTE — Anesthesia Procedure Notes (Signed)
Procedure Name: Intubation Date/Time: 02/20/2018 9:00 AM Performed by: Nelda Marseille, CRNA Pre-anesthesia Checklist: Patient identified, Patient being monitored, Timeout performed, Emergency Drugs available and Suction available Patient Re-evaluated:Patient Re-evaluated prior to induction Oxygen Delivery Method: Circle system utilized Preoxygenation: Pre-oxygenation with 100% oxygen Induction Type: IV induction Ventilation: Mask ventilation without difficulty Laryngoscope Size: Mac and 3 Grade View: Grade II Tube type: Oral Tube size: 7.0 mm Number of attempts: 1 Airway Equipment and Method: Stylet Placement Confirmation: ETT inserted through vocal cords under direct vision,  positive ETCO2 and breath sounds checked- equal and bilateral Secured at: 21 cm Tube secured with: Tape Dental Injury: Teeth and Oropharynx as per pre-operative assessment

## 2018-02-20 NOTE — Transfer of Care (Signed)
Immediate Anesthesia Transfer of Care Note  Patient: Jean Brooks  Procedure(s) Performed: LAPAROSCOPIC BILATERAL SALPINGO OOPHORECTOMY (Bilateral ) LAPAROSCOPIC LYSIS OF ADHESIONS  Patient Location: PACU  Anesthesia Type:General  Level of Consciousness: sedated  Airway & Oxygen Therapy: Patient Spontanous Breathing and Patient connected to face mask oxygen  Post-op Assessment: Report given to RN and Post -op Vital signs reviewed and stable  Post vital signs: Reviewed and stable  Last Vitals:  Vitals Value Taken Time  BP    Temp    Pulse    Resp    SpO2      Last Pain:  Vitals:   02/20/18 0616  TempSrc: Oral  PainSc: 0-No pain         Complications: No apparent anesthesia complications

## 2018-02-20 NOTE — Discharge Instructions (Signed)
Discharge instructions:  Call office if you have any of the following: fever >101 F, chills, excessive vaginal bleeding, incision drainage or problems, leg pain or redness, or any other concerns.   Activity: Do not lift > 10 lbs for 6 weeks.   No driving until you are certain you can slam on the brakes.  Or on narcotics.  You may feel some pain in your upper right abdomen/rib and right shoulder.  This is from the gas in the abdomen for surgery. This will subside over time, please be patient!  Take 1000mg  Tylenol around the clock, every 6 hours for at least the first 3-5 days.  After this you can take as needed.  This will help decrease inflammation and promote healing.  The narcotics you'll take just as needed, as they just trick your brain into thinking its not in pain.  Take Gabapentin 300mg  in the morning and early afternoon.  Take 900mg  before bed.  Please don't limit yourself in terms of routine activity.  You will be able to do most things, although they may take longer to do or be a little painful.  You can do it!  Don't be a hero, but don't be a wimp either!    AMBULATORY SURGERY  DISCHARGE INSTRUCTIONS   1) The drugs that you were given will stay in your system until tomorrow so for the next 24 hours you should not:  A) Drive an automobile B) Make any legal decisions C) Drink any alcoholic beverage   2) You may resume regular meals tomorrow.  Today it is better to start with liquids and gradually work up to solid foods.  You may eat anything you prefer, but it is better to start with liquids, then soup and crackers, and gradually work up to solid foods.   3) Please notify your doctor immediately if you have any unusual bleeding, trouble breathing, redness and pain at the surgery site, drainage, fever, or pain not relieved by medication.    4) Additional Instructions:        Please contact your physician with any problems or Same Day Surgery at [phone], Monday  through Friday 6 am to 4 pm, or [place] at [facility] number at [phone].

## 2018-02-20 NOTE — Anesthesia Post-op Follow-up Note (Signed)
Anesthesia QCDR form completed.        

## 2018-02-21 NOTE — Anesthesia Postprocedure Evaluation (Signed)
Anesthesia Post Note  Patient: Jean Brooks  Procedure(s) Performed: LAPAROSCOPIC BILATERAL SALPINGO OOPHORECTOMY (Bilateral ) LAPAROSCOPIC LYSIS OF ADHESIONS  Patient location during evaluation: PACU Anesthesia Type: General Level of consciousness: awake and alert and oriented Pain management: pain level controlled Vital Signs Assessment: post-procedure vital signs reviewed and stable Respiratory status: spontaneous breathing Cardiovascular status: blood pressure returned to baseline Anesthetic complications: no     Last Vitals:  Vitals:   02/20/18 1207 02/20/18 1244  BP: 112/64 131/61  Pulse: 67 68  Resp: 14 16  Temp:  (!) 36.2 C  SpO2: 98% 100%    Last Pain:  Vitals:   02/20/18 1244  TempSrc: Temporal  PainSc: 4                  Albirda Shiel

## 2018-02-22 ENCOUNTER — Encounter: Payer: Self-pay | Admitting: Obstetrics & Gynecology

## 2018-02-22 ENCOUNTER — Telehealth: Payer: Self-pay | Admitting: Obstetrics & Gynecology

## 2018-02-22 NOTE — Telephone Encounter (Addendum)
Called this afternoon to see how she was doing.  Some discomfort, but nothing limiting.  Doing well.  No concerning issues.  Will see when she would like to return to work.  Will need a note.  CW

## 2018-02-24 LAB — CYTOLOGY - NON PAP

## 2018-02-24 LAB — SURGICAL PATHOLOGY

## 2018-03-10 DIAGNOSIS — N83201 Unspecified ovarian cyst, right side: Secondary | ICD-10-CM | POA: Diagnosis not present

## 2018-03-10 DIAGNOSIS — Z9889 Other specified postprocedural states: Secondary | ICD-10-CM | POA: Diagnosis not present

## 2018-03-10 DIAGNOSIS — N83202 Unspecified ovarian cyst, left side: Secondary | ICD-10-CM | POA: Diagnosis not present

## 2018-06-09 LAB — HM COLONOSCOPY

## 2018-06-10 ENCOUNTER — Other Ambulatory Visit: Payer: Self-pay | Admitting: Internal Medicine

## 2018-06-10 DIAGNOSIS — Z1231 Encounter for screening mammogram for malignant neoplasm of breast: Secondary | ICD-10-CM

## 2018-06-17 DIAGNOSIS — L720 Epidermal cyst: Secondary | ICD-10-CM | POA: Diagnosis not present

## 2018-06-17 DIAGNOSIS — L821 Other seborrheic keratosis: Secondary | ICD-10-CM | POA: Diagnosis not present

## 2018-06-17 DIAGNOSIS — D229 Melanocytic nevi, unspecified: Secondary | ICD-10-CM | POA: Diagnosis not present

## 2018-06-24 ENCOUNTER — Other Ambulatory Visit: Payer: Self-pay | Admitting: Internal Medicine

## 2018-06-24 NOTE — Telephone Encounter (Signed)
Refilled: 12/17/2017 Last OV: 12/17/2017 Next OV:  06/29/2018

## 2018-06-29 ENCOUNTER — Encounter: Payer: Self-pay | Admitting: Internal Medicine

## 2018-06-29 ENCOUNTER — Ambulatory Visit (INDEPENDENT_AMBULATORY_CARE_PROVIDER_SITE_OTHER): Payer: 59 | Admitting: Internal Medicine

## 2018-06-29 VITALS — BP 106/64 | HR 62 | Temp 98.3°F | Resp 15 | Ht 63.0 in | Wt 110.8 lb

## 2018-06-29 DIAGNOSIS — I83813 Varicose veins of bilateral lower extremities with pain: Secondary | ICD-10-CM

## 2018-06-29 DIAGNOSIS — M8588 Other specified disorders of bone density and structure, other site: Secondary | ICD-10-CM | POA: Diagnosis not present

## 2018-06-29 DIAGNOSIS — E782 Mixed hyperlipidemia: Secondary | ICD-10-CM | POA: Diagnosis not present

## 2018-06-29 DIAGNOSIS — I872 Venous insufficiency (chronic) (peripheral): Secondary | ICD-10-CM | POA: Diagnosis not present

## 2018-06-29 DIAGNOSIS — N83201 Unspecified ovarian cyst, right side: Secondary | ICD-10-CM | POA: Diagnosis not present

## 2018-06-29 DIAGNOSIS — G4700 Insomnia, unspecified: Secondary | ICD-10-CM | POA: Diagnosis not present

## 2018-06-29 DIAGNOSIS — Z780 Asymptomatic menopausal state: Secondary | ICD-10-CM

## 2018-06-29 DIAGNOSIS — N6459 Other signs and symptoms in breast: Secondary | ICD-10-CM

## 2018-06-29 DIAGNOSIS — Z0001 Encounter for general adult medical examination with abnormal findings: Secondary | ICD-10-CM | POA: Diagnosis not present

## 2018-06-29 DIAGNOSIS — N83202 Unspecified ovarian cyst, left side: Secondary | ICD-10-CM

## 2018-06-29 DIAGNOSIS — N6320 Unspecified lump in the left breast, unspecified quadrant: Secondary | ICD-10-CM

## 2018-06-29 NOTE — Patient Instructions (Addendum)
You do not need any labs at this time.  Your cholesterol was fine in March  Increase your Vitamin D to 2000 units daily from November to April  Mammogram has been reordered as a diagnostic with ultrasound  I am also ordering your bone density test    Health Maintenance for Postmenopausal Women Menopause is a normal process in which your reproductive ability comes to an end. This process happens gradually over a span of months to years, usually between the ages of 76 and 99. Menopause is complete when you have missed 12 consecutive menstrual periods. It is important to talk with your health care provider about some of the most common conditions that affect postmenopausal women, such as heart disease, cancer, and bone loss (osteoporosis). Adopting a healthy lifestyle and getting preventive care can help to promote your health and wellness. Those actions can also lower your chances of developing some of these common conditions. What should I know about menopause? During menopause, you may experience a number of symptoms, such as:  Moderate-to-severe hot flashes.  Night sweats.  Decrease in sex drive.  Mood swings.  Headaches.  Tiredness.  Irritability.  Memory problems.  Insomnia.  Choosing to treat or not to treat menopausal changes is an individual decision that you make with your health care provider. What should I know about hormone replacement therapy and supplements? Hormone therapy products are effective for treating symptoms that are associated with menopause, such as hot flashes and night sweats. Hormone replacement carries certain risks, especially as you become older. If you are thinking about using estrogen or estrogen with progestin treatments, discuss the benefits and risks with your health care provider. What should I know about heart disease and stroke? Heart disease, heart attack, and stroke become more likely as you age. This may be due, in part, to the hormonal  changes that your body experiences during menopause. These can affect how your body processes dietary fats, triglycerides, and cholesterol. Heart attack and stroke are both medical emergencies. There are many things that you can do to help prevent heart disease and stroke:  Have your blood pressure checked at least every 1-2 years. High blood pressure causes heart disease and increases the risk of stroke.  If you are 58-10 years old, ask your health care provider if you should take aspirin to prevent a heart attack or a stroke.  Do not use any tobacco products, including cigarettes, chewing tobacco, or electronic cigarettes. If you need help quitting, ask your health care provider.  It is important to eat a healthy diet and maintain a healthy weight. ? Be sure to include plenty of vegetables, fruits, low-fat dairy products, and lean protein. ? Avoid eating foods that are high in solid fats, added sugars, or salt (sodium).  Get regular exercise. This is one of the most important things that you can do for your health. ? Try to exercise for at least 150 minutes each week. The type of exercise that you do should increase your heart rate and make you sweat. This is known as moderate-intensity exercise. ? Try to do strengthening exercises at least twice each week. Do these in addition to the moderate-intensity exercise.  Know your numbers.Ask your health care provider to check your cholesterol and your blood glucose. Continue to have your blood tested as directed by your health care provider.  What should I know about cancer screening? There are several types of cancer. Take the following steps to reduce your risk and to  catch any cancer development as early as possible. Breast Cancer  Practice breast self-awareness. ? This means understanding how your breasts normally appear and feel. ? It also means doing regular breast self-exams. Let your health care provider know about any changes, no  matter how small.  If you are 70 or older, have a clinician do a breast exam (clinical breast exam or CBE) every year. Depending on your age, family history, and medical history, it may be recommended that you also have a yearly breast X-ray (mammogram).  If you have a family history of breast cancer, talk with your health care provider about genetic screening.  If you are at high risk for breast cancer, talk with your health care provider about having an MRI and a mammogram every year.  Breast cancer (BRCA) gene test is recommended for women who have family members with BRCA-related cancers. Results of the assessment will determine the need for genetic counseling and BRCA1 and for BRCA2 testing. BRCA-related cancers include these types: ? Breast. This occurs in males or females. ? Ovarian. ? Tubal. This may also be called fallopian tube cancer. ? Cancer of the abdominal or pelvic lining (peritoneal cancer). ? Prostate. ? Pancreatic.  Cervical, Uterine, and Ovarian Cancer Your health care provider may recommend that you be screened regularly for cancer of the pelvic organs. These include your ovaries, uterus, and vagina. This screening involves a pelvic exam, which includes checking for microscopic changes to the surface of your cervix (Pap test).  For women ages 21-65, health care providers may recommend a pelvic exam and a Pap test every three years. For women ages 63-65, they may recommend the Pap test and pelvic exam, combined with testing for human papilloma virus (HPV), every five years. Some types of HPV increase your risk of cervical cancer. Testing for HPV may also be done on women of any age who have unclear Pap test results.  Other health care providers may not recommend any screening for nonpregnant women who are considered low risk for pelvic cancer and have no symptoms. Ask your health care provider if a screening pelvic exam is right for you.  If you have had past treatment for  cervical cancer or a condition that could lead to cancer, you need Pap tests and screening for cancer for at least 20 years after your treatment. If Pap tests have been discontinued for you, your risk factors (such as having a new sexual partner) need to be reassessed to determine if you should start having screenings again. Some women have medical problems that increase the chance of getting cervical cancer. In these cases, your health care provider may recommend that you have screening and Pap tests more often.  If you have a family history of uterine cancer or ovarian cancer, talk with your health care provider about genetic screening.  If you have vaginal bleeding after reaching menopause, tell your health care provider.  There are currently no reliable tests available to screen for ovarian cancer.  Lung Cancer Lung cancer screening is recommended for adults 28-51 years old who are at high risk for lung cancer because of a history of smoking. A yearly low-dose CT scan of the lungs is recommended if you:  Currently smoke.  Have a history of at least 30 pack-years of smoking and you currently smoke or have quit within the past 15 years. A pack-year is smoking an average of one pack of cigarettes per day for one year.  Yearly screening should:  Continue until it has been 15 years since you quit.  Stop if you develop a health problem that would prevent you from having lung cancer treatment.  Colorectal Cancer  This type of cancer can be detected and can often be prevented.  Routine colorectal cancer screening usually begins at age 79 and continues through age 38.  If you have risk factors for colon cancer, your health care provider may recommend that you be screened at an earlier age.  If you have a family history of colorectal cancer, talk with your health care provider about genetic screening.  Your health care provider may also recommend using home test kits to check for hidden  blood in your stool.  A small camera at the end of a tube can be used to examine your colon directly (sigmoidoscopy or colonoscopy). This is done to check for the earliest forms of colorectal cancer.  Direct examination of the colon should be repeated every 5-10 years until age 15. However, if early forms of precancerous polyps or small growths are found or if you have a family history or genetic risk for colorectal cancer, you may need to be screened more often.  Skin Cancer  Check your skin from head to toe regularly.  Monitor any moles. Be sure to tell your health care provider: ? About any new moles or changes in moles, especially if there is a change in a mole's shape or color. ? If you have a mole that is larger than the size of a pencil eraser.  If any of your family members has a history of skin cancer, especially at a young age, talk with your health care provider about genetic screening.  Always use sunscreen. Apply sunscreen liberally and repeatedly throughout the day.  Whenever you are outside, protect yourself by wearing long sleeves, pants, a wide-brimmed hat, and sunglasses.  What should I know about osteoporosis? Osteoporosis is a condition in which bone destruction happens more quickly than new bone creation. After menopause, you may be at an increased risk for osteoporosis. To help prevent osteoporosis or the bone fractures that can happen because of osteoporosis, the following is recommended:  If you are 42-2 years old, get at least 1,000 mg of calcium and at least 600 mg of vitamin D per day.  If you are older than age 22 but younger than age 58, get at least 1,200 mg of calcium and at least 600 mg of vitamin D per day.  If you are older than age 66, get at least 1,200 mg of calcium and at least 800 mg of vitamin D per day.  Smoking and excessive alcohol intake increase the risk of osteoporosis. Eat foods that are rich in calcium and vitamin D, and do weight-bearing  exercises several times each week as directed by your health care provider. What should I know about how menopause affects my mental health? Depression may occur at any age, but it is more common as you become older. Common symptoms of depression include:  Low or sad mood.  Changes in sleep patterns.  Changes in appetite or eating patterns.  Feeling an overall lack of motivation or enjoyment of activities that you previously enjoyed.  Frequent crying spells.  Talk with your health care provider if you think that you are experiencing depression. What should I know about immunizations? It is important that you get and maintain your immunizations. These include:  Tetanus, diphtheria, and pertussis (Tdap) booster vaccine.  Influenza every year before the  flu season begins.  Pneumonia vaccine.  Shingles vaccine.  Your health care provider may also recommend other immunizations. This information is not intended to replace advice given to you by your health care provider. Make sure you discuss any questions you have with your health care provider. Document Released: 11/08/2005 Document Revised: 04/05/2016 Document Reviewed: 06/20/2015 Elsevier Interactive Patient Education  2018 [employer].

## 2018-06-29 NOTE — Progress Notes (Signed)
Patient ID: ARELYN GAUER, female    DOB: [birth year]  Age: 72 y.o. MRN: [id number]  The patient is here for annual PREVENTIVE  examination and management of other chronic and acute problems.   The risk factors are reflected in the social history.  The roster of all physicians providing medical care to patient - is listed in the Snapshot section of the chart.  Activities of daily living:  The patient is 100% independent in all ADLs: dressing, toileting, feeding as well as independent mobility.  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   Home safety : The patient has smoke detectors in the home. They wear seatbelts.  There are no firearms at home. There is no violence in the home.   There is no risks for hepatitis, STDs or HIV. There is no   history of blood transfusion. They have no travel history to infectious disease endemic areas of the world.  The patient has seen their dentist in the last six month. They have seen their eye doctor in the last year. They admit to slight hearing difficulty with regard to whispered voices and some television programs.  They have deferred audiologic testing in the last year.  They do not  have excessive sun exposure. Discussed the need for sun protection: hats, long sleeves and use of sunscreen if there is significant sun exposure.   Diet: the importance of a healthy diet is discussed. They do have a healthy diet.  The benefits of regular aerobic exercise were discussed. She walks 4 times per week ,  20 minutes.   Depression screen: there are no signs or vegative symptoms of depression- irritability, change in appetite, anhedonia, sadness/tearfullness.  Cognitive assessment: the patient manages all their financial and personal affairs and is actively engaged. They could relate day,date,year and events; recalled 2/3 objects at 3 minutes; performed clock-face test normally.  The following portions of the patient's history were reviewed and updated  as appropriate: allergies, current medications, past family history, past medical history,  past surgical history, past social history  and problem list.  Visual acuity was not assessed per patient preference since she has regular follow up with her ophthalmologist. Hearing and body mass index were assessed and reviewed.   During the course of the visit the patient was educated and counseled about appropriate screening and preventive services including : fall prevention , diabetes screening, nutrition counseling, colorectal cancer screening, and recommended immunizations.    CC: The primary encounter diagnosis was Left breast mass. Diagnoses of Postmenopausal estrogen deficiency, Varicose veins of leg with pain, bilateral, Chronic venous insufficiency, Cysts of both ovaries, Encounter for general adult medical examination with abnormal findings, Moderate mixed hyperlipidemia not requiring statin therapy, Osteopenia of lumbar spine, Insomnia, persistent, and Abnormal breast exam were also pertinent to this visit.  1) bilatera ovarian cysts  Removed  Via lap BSO n May 24 by [facility] . Path report: benign  serous cystadenonas.  Feels she has finally recovered from the surgery fully.  No known complications  2) Left breast tender in the lateral superior position,  First noted a few weeks ago  After hugging her grandwon.  Denies bruising of breast.   3) Varicose veins with mild lymphedema and nocrutal pain. Advised to try using a lymphedema pump by [facility] bu t [facility] would not pay.  Wants a second opinion, in [place].  Referral to [facility] discussed ,      History  Sedra has a past medical history of Arthritis, Chest pain, non-cardiac (8675), Complication of anesthesia, Coronary artery disease, Diverticulitis of colon with hemorrhage (August 2009), Family history of adverse reaction to anesthesia, H/O syncope, Heart murmur, Migraine headache, PONV (postoperative nausea and vomiting),  Scoliosis of thoracic spine, and Shingles.   She has a past surgical history that includes Appendectomy (1963); Abdominal hysterectomy (1991); Colonoscopy; Esophagogastroduodenoscopy; Tonsillectomy; Laparoscopic bilateral salpingo oophorectomy (Bilateral, 02/20/2018); and Laparoscopic lysis of adhesions (02/20/2018).   Her family history includes Aneurysm (age of onset: 70) in her father; Breast cancer in her maternal aunt; COPD (age of onset: 78) in her father; COPD (age of onset: 36) in her mother; Heart attack (age of onset: 60) in her maternal grandfather; Hypertension in her mother; Mental illness in her father; Stroke in her maternal grandmother and mother; Stroke (age of onset: 28) in her son; Supraventricular tachycardia in her mother.She reports that she has never smoked. She has never used smokeless tobacco. She reports that she does not drink alcohol or use drugs.  Outpatient Medications Prior to Visit  Medication Sig Dispense Refill  . Cholecalciferol (VITAMIN D3) 1000 UNITS CAPS Take 1 capsule by mouth every other day. Alternate with the magnesium for leg pain    . Magnesium 250 MG TABS Take 250 mg by mouth every other day. Alternate with vitamin d 3 for leg cramps    . sodium chloride (MURO 128) 5 % ophthalmic ointment Place 1 application into the right eye at bedtime.    Marland Kitchen zolpidem (AMBIEN CR) 12.5 MG CR tablet TAKE 1 TABLET BY MOUTH AT BEDTIME AS NEEDED FOR SLEEP 30 tablet 5  . gabapentin (NEURONTIN) 300 MG capsule Take 1 capsule (300 mg total) by mouth as directed. 300mg  in morning, afternoon and 900mg  before bed (Patient not taking: Reported on 06/29/2018) 120 capsule 2  . ondansetron (ZOFRAN) 4 MG tablet Take 1 tablet (4 mg total) by mouth every 8 (eight) hours as needed for nausea or vomiting. (Patient not taking: Reported on 02/10/2018) 20 tablet 0  . oxyCODONE (ROXICODONE) 5 MG immediate release tablet Take 1 tablet (5 mg total) by mouth every 4 (four) hours as needed. (Patient not  taking: Reported on 06/29/2018) 21 tablet 0   No facility-administered medications prior to visit.     Review of Systems   Patient denies headache, fevers, malaise, unintentional weight loss, skin rash, eye pain, sinus congestion and sinus pain, sore throat, dysphagia,  hemoptysis , cough, dyspnea, wheezing, chest pain, palpitations, orthopnea, edema, abdominal pain, nausea, melena, diarrhea, constipation, flank pain, dysuria, hematuria, urinary  Frequency, nocturia, numbness, tingling, seizures,  Focal weakness, Loss of consciousness,  Tremor, insomnia, depression, anxiety, and suicidal ideation.      Objective:  BP 106/64 (BP Location: Left Arm, Patient Position: Sitting, Cuff Size: Normal)   Pulse 62   Temp 98.3 F (36.8 C) (Oral)   Resp 15   Ht 5\' 3"  (1.6 m)   Wt 110 lb 12.8 oz (50.3 kg)   SpO2 97%   BMI 19.63 kg/m   Physical Exam   General appearance: alert, gaunt. cooperative and appears stated age Head: Normocephalic, without obvious abnormality, atraumatic Eyes: conjunctivae/corneas clear. PERRL, EOM's intact. Fundi benign. Ears: normal TM's and external ear canals both ears Nose: Nares normal. Septum midline. Mucosa normal. No drainage or sinus tenderness. Throat: lips, mucosa, and tongue normal; teeth and gums normal Neck: no adenopathy, no carotid bruit, no JVD, supple, symmetrical, trachea midline and thyroid not enlarged, symmetric, no  tenderness/mass/nodules Lungs: clear to auscultation bilaterally Breasts: normal appearance, no dimpling.  Left lateral palpable mobile mass at 2:00 position 1 cm from nipple , mildly tender  Heart: regular rate and rhythm, S1, S2 normal, no murmur, click, rub or gallop Abdomen: soft, non-tender; bowel sounds normal; no masses,  no organomegaly Extremities: extremities normal, atraumatic, no cyanosis or edema Pulses: 2+ and symmetric Skin: Skin color, texture, turgor normal. No rashes or lesions Neurologic: Alert and oriented X 3,  normal strength and tone. Normal symmetric reflexes. Normal coordination and gait.      Assessment & Plan:   Problem List Items Addressed This Visit    Abnormal breast exam    Left lateral breast,  With tenderness in area for several weeks.  Diagnostic mammogram ordered       Chronic venous insufficiency    Vs early lymphedema, managed with compression knee highs       Cyst, ovarian   Encounter for general adult medical examination with abnormal findings    Annual comprehensive preventive exam was done as well as an evaluation and management of chronic conditions .  During the course of the visit the patient was educated and counseled about appropriate screening and preventive services including :  diabetes screening, lipid analysis with projected  10 year  risk for CAD , nutrition counseling, breast, and colorectal cancer screening, and recommended immunizations.  Printed recommendations for health maintenance screenings was given.  Based on her abnormal breast exam,  A diagnostic mammogram has been ordered to evaluate the palpable lump in left latera breast       Insomnia, persistent    Trial of restoril  And trazodone were not successful  . She has good results with ambien        Moderate mixed hyperlipidemia not requiring statin therapy    She has declined statin therapy despite presence of atherosclerosis on prior CT .  10 yr assessment of risk using FRC is 7%       Osteopenia    Estrogen deficiency .  dexa last done in 2012.  Repeat ordered,  Calcium and vit d needs reviewed,        Varicose veins of leg with pain, bilateral   Relevant Orders   Ambulatory referral to Vascular Surgery    Other Visit Diagnoses    Left breast mass    -  Primary   Relevant Orders   MM Digital Diagnostic Bilat   US BREAST COMPLETE UNI LEFT INC AXILLA   Postmenopausal estrogen deficiency       Relevant Orders   DG Bone Density      I have discontinued Mardene Celeste C. Marchitto "[name]"'s  oxyCODONE and gabapentin. I am also having her maintain her Vitamin D3, Magnesium, sodium chloride, zolpidem, and ondansetron.  Meds ordered this encounter  Medications  . ondansetron (ZOFRAN) 4 MG tablet    Sig: Take 1 tablet (4 mg total) by mouth every 8 (eight) hours as needed for nausea or vomiting.    Dispense:  20 tablet    Refill:  0    Medications Discontinued During This Encounter  Medication Reason  . gabapentin (NEURONTIN) 300 MG capsule Patient has not taken in last 30 days  . oxyCODONE (ROXICODONE) 5 MG immediate release tablet Patient has not taken in last 30 days  . ondansetron (ZOFRAN) 4 MG tablet Reorder    Follow-up: No follow-ups on file.   Crecencio Mc, MD

## 2018-06-30 DIAGNOSIS — N6459 Other signs and symptoms in breast: Secondary | ICD-10-CM | POA: Insufficient documentation

## 2018-06-30 MED ORDER — ONDANSETRON HCL 4 MG PO TABS: 4.0000 mg | ORAL_TABLET | Freq: Three times a day (TID) | ORAL | 0 refills | Status: DC | PRN

## 2018-06-30 NOTE — Assessment & Plan Note (Signed)
Left lateral breast,  With tenderness in area for several weeks.  Diagnostic mammogram ordered

## 2018-06-30 NOTE — Assessment & Plan Note (Signed)
Trial of restoril  And trazodone were not successful  . She has good results with [place]

## 2018-06-30 NOTE — Assessment & Plan Note (Addendum)
Annual comprehensive preventive exam was done as well as an evaluation and management of chronic conditions .  During the course of the visit the patient was educated and counseled about appropriate screening and preventive services including :  diabetes screening, lipid analysis with projected  10 year  risk for CAD , nutrition counseling, breast, and colorectal cancer screening, and recommended immunizations.  Printed recommendations for health maintenance screenings was given.  Based on her abnormal breast exam,  A diagnostic mammogram has been ordered to evaluate the palpable lump in left latera breast

## 2018-06-30 NOTE — Assessment & Plan Note (Signed)
Vs early lymphedema, managed with compression knee highs

## 2018-06-30 NOTE — Assessment & Plan Note (Addendum)
She has declined statin therapy despite presence of atherosclerosis on prior CT .  10 yr assessment of risk using FRC is 7%

## 2018-06-30 NOTE — Assessment & Plan Note (Signed)
Estrogen deficiency .  dexa last done in 2012.  Repeat ordered,  Calcium and vit d needs reviewed,

## 2018-07-03 ENCOUNTER — Other Ambulatory Visit: Payer: Self-pay | Admitting: Internal Medicine

## 2018-07-03 DIAGNOSIS — Z1231 Encounter for screening mammogram for malignant neoplasm of breast: Secondary | ICD-10-CM

## 2018-07-03 DIAGNOSIS — N6320 Unspecified lump in the left breast, unspecified quadrant: Secondary | ICD-10-CM

## 2018-07-15 ENCOUNTER — Ambulatory Visit
Admission: RE | Admit: 2018-07-15 | Discharge: 2018-07-15 | Disposition: A | Discharge status: Home / Self Care | Payer: 59 | Source: Ambulatory Visit | Attending: Internal Medicine | Admitting: Internal Medicine

## 2018-07-15 DIAGNOSIS — Z1231 Encounter for screening mammogram for malignant neoplasm of breast: Secondary | ICD-10-CM

## 2018-07-15 DIAGNOSIS — N6320 Unspecified lump in the left breast, unspecified quadrant: Secondary | ICD-10-CM

## 2018-07-15 DIAGNOSIS — N644 Mastodynia: Secondary | ICD-10-CM | POA: Diagnosis not present

## 2018-07-15 DIAGNOSIS — R928 Other abnormal and inconclusive findings on diagnostic imaging of breast: Secondary | ICD-10-CM | POA: Diagnosis not present

## 2018-09-11 ENCOUNTER — Other Ambulatory Visit: Payer: Self-pay

## 2018-09-11 DIAGNOSIS — I83813 Varicose veins of bilateral lower extremities with pain: Secondary | ICD-10-CM

## 2018-10-05 ENCOUNTER — Encounter: Payer: Self-pay | Admitting: Surgery

## 2018-10-05 ENCOUNTER — Encounter (HOSPITAL_COMMUNITY): Payer: Self-pay

## 2018-10-13 ENCOUNTER — Other Ambulatory Visit: Payer: Self-pay | Admitting: Physician Assistant

## 2018-10-13 DIAGNOSIS — R1310 Dysphagia, unspecified: Secondary | ICD-10-CM

## 2018-10-13 DIAGNOSIS — K219 Gastro-esophageal reflux disease without esophagitis: Secondary | ICD-10-CM | POA: Diagnosis not present

## 2018-10-28 ENCOUNTER — Ambulatory Visit: Payer: 59

## 2018-11-13 DIAGNOSIS — G43B0 Ophthalmoplegic migraine, not intractable: Secondary | ICD-10-CM | POA: Diagnosis not present

## 2018-11-13 DIAGNOSIS — H2513 Age-related nuclear cataract, bilateral: Secondary | ICD-10-CM | POA: Diagnosis not present

## 2018-11-13 DIAGNOSIS — H40013 Open angle with borderline findings, low risk, bilateral: Secondary | ICD-10-CM | POA: Diagnosis not present

## 2018-11-26 DIAGNOSIS — R300 Dysuria: Secondary | ICD-10-CM | POA: Diagnosis not present

## 2018-12-04 ENCOUNTER — Other Ambulatory Visit: Payer: Self-pay | Admitting: Internal Medicine

## 2018-12-07 NOTE — Telephone Encounter (Signed)
Refilled: 06/24/2018 Last OV: 06/29/2018 Next OV: not scheduled

## 2018-12-10 DIAGNOSIS — K5730 Diverticulosis of large intestine without perforation or abscess without bleeding: Secondary | ICD-10-CM | POA: Diagnosis not present

## 2018-12-10 DIAGNOSIS — Z86010 Personal history of colonic polyps: Secondary | ICD-10-CM | POA: Diagnosis not present

## 2018-12-10 DIAGNOSIS — K640 First degree hemorrhoids: Secondary | ICD-10-CM | POA: Diagnosis not present

## 2018-12-10 DIAGNOSIS — R1310 Dysphagia, unspecified: Secondary | ICD-10-CM | POA: Diagnosis not present

## 2018-12-10 LAB — HM COLONOSCOPY

## 2019-02-23 ENCOUNTER — Telehealth: Payer: Self-pay

## 2019-02-23 NOTE — Telephone Encounter (Signed)
Called patient from recall list.  No answer. LMOV.  This is the second attempt per recall list.  Looks like patient sees [facility] as well. Need to verify this.

## 2019-05-17 ENCOUNTER — Other Ambulatory Visit: Payer: Self-pay | Admitting: Internal Medicine

## 2019-05-17 NOTE — Telephone Encounter (Signed)
Refilled: 12/07/2018 Last OV: 06/29/2018 Next OV: not scheduled

## 2019-05-18 NOTE — Telephone Encounter (Signed)
Refilled: 12/07/2018 Last OV: 06/29/2018 Next OV: 07/08/2019

## 2019-06-11 DIAGNOSIS — H8101 Meniere's disease, right ear: Secondary | ICD-10-CM | POA: Diagnosis not present

## 2019-06-11 DIAGNOSIS — H6062 Unspecified chronic otitis externa, left ear: Secondary | ICD-10-CM | POA: Diagnosis not present

## 2019-06-15 ENCOUNTER — Other Ambulatory Visit: Payer: Self-pay | Admitting: Internal Medicine

## 2019-06-15 DIAGNOSIS — Z1231 Encounter for screening mammogram for malignant neoplasm of breast: Secondary | ICD-10-CM

## 2019-06-16 NOTE — Telephone Encounter (Signed)
Refilled: 05/18/2019 Last OV: 06/30/2019 Next OV: 07/08/2019

## 2019-06-21 DIAGNOSIS — D1801 Hemangioma of skin and subcutaneous tissue: Secondary | ICD-10-CM | POA: Diagnosis not present

## 2019-06-21 DIAGNOSIS — L820 Inflamed seborrheic keratosis: Secondary | ICD-10-CM | POA: Diagnosis not present

## 2019-06-21 DIAGNOSIS — D485 Neoplasm of uncertain behavior of skin: Secondary | ICD-10-CM | POA: Diagnosis not present

## 2019-07-06 ENCOUNTER — Other Ambulatory Visit: Payer: Self-pay

## 2019-07-08 ENCOUNTER — Encounter: Payer: Self-pay | Admitting: Internal Medicine

## 2019-07-08 ENCOUNTER — Ambulatory Visit (INDEPENDENT_AMBULATORY_CARE_PROVIDER_SITE_OTHER): Payer: 59 | Admitting: Internal Medicine

## 2019-07-08 ENCOUNTER — Encounter

## 2019-07-08 ENCOUNTER — Other Ambulatory Visit: Payer: Self-pay

## 2019-07-08 VITALS — BP 120/76 | HR 78 | Temp 97.2°F | Resp 14 | Ht 63.0 in | Wt 108.6 lb

## 2019-07-08 DIAGNOSIS — Z0000 Encounter for general adult medical examination without abnormal findings: Secondary | ICD-10-CM | POA: Diagnosis not present

## 2019-07-08 DIAGNOSIS — E781 Pure hyperglyceridemia: Secondary | ICD-10-CM

## 2019-07-08 DIAGNOSIS — R030 Elevated blood-pressure reading, without diagnosis of hypertension: Secondary | ICD-10-CM | POA: Diagnosis not present

## 2019-07-08 DIAGNOSIS — R634 Abnormal weight loss: Secondary | ICD-10-CM | POA: Diagnosis not present

## 2019-07-08 DIAGNOSIS — E559 Vitamin D deficiency, unspecified: Secondary | ICD-10-CM | POA: Diagnosis not present

## 2019-07-08 DIAGNOSIS — G4700 Insomnia, unspecified: Secondary | ICD-10-CM | POA: Diagnosis not present

## 2019-07-08 LAB — COMPREHENSIVE METABOLIC PANEL
ALT: 18 U/L (ref 0–35)
AST: 21 U/L (ref 0–37)
Albumin: 4.4 g/dL (ref 3.5–5.2)
Alkaline Phosphatase: 55 U/L (ref 39–117)
BUN: 11 mg/dL (ref 6–23)
CO2: 29 mEq/L (ref 19–32)
Calcium: 9.6 mg/dL (ref 8.4–10.5)
Chloride: 103 mEq/L (ref 96–112)
Creatinine, Ser: 0.74 mg/dL (ref 0.40–1.20)
GFR: 77.14 mL/min (ref >60.00)
Glucose, Bld: 86 mg/dL (ref 70–99)
Potassium: 3.9 mEq/L (ref 3.5–5.1)
Sodium: 138 mEq/L (ref 135–145)
Total Bilirubin: 0.6 mg/dL (ref 0.2–1.2)
Total Protein: 6.9 g/dL (ref 6.0–8.3)

## 2019-07-08 LAB — URINALYSIS, ROUTINE W REFLEX MICROSCOPIC
Bilirubin Urine: NEGATIVE
Hgb urine dipstick: NEGATIVE
Ketones, ur: NEGATIVE
Leukocytes,Ua: NEGATIVE
Nitrite: NEGATIVE
RBC / HPF: NONE SEEN (ref 0–?)
Specific Gravity, Urine: 1.01 (ref 1.000–1.030)
Total Protein, Urine: NEGATIVE
Urine Glucose: NEGATIVE
Urobilinogen, UA: 0.2 (ref 0.0–1.0)
WBC, UA: NONE SEEN (ref 0–?)
pH: 7 (ref 5.0–8.0)

## 2019-07-08 LAB — LIPID PANEL
Cholesterol: 213 mg/dL — ABNORMAL HIGH (ref 0–200)
HDL: 65.9 mg/dL (ref >39.00)
LDL Cholesterol: 135 mg/dL — ABNORMAL HIGH (ref 0–99)
NonHDL: 147.18
Total CHOL/HDL Ratio: 3
Triglycerides: 63 mg/dL (ref 0.0–149.0)
VLDL: 12.6 mg/dL (ref 0.0–40.0)

## 2019-07-08 LAB — TSH: TSH: 1.45 u[IU]/mL (ref 0.35–4.50)

## 2019-07-08 LAB — VITAMIN D 25 HYDROXY (VIT D DEFICIENCY, FRACTURES): VITD: 26.47 ng/mL — ABNORMAL LOW (ref 30.00–100.00)

## 2019-07-08 MED ORDER — ZOSTER VAC RECOMB ADJUVANTED 50 MCG/0.5ML IM SUSR: 0.5000 mL | Freq: Once | INTRAMUSCULAR | 1 refills | Status: AC

## 2019-07-08 MED ORDER — MIRTAZAPINE 15 MG PO TABS: ORAL_TABLET | ORAL | 2 refills | Status: DC

## 2019-07-08 NOTE — Patient Instructions (Addendum)
Your blood pressure was elevated in both arms today  Please check it in both arms at work once daily for one week and send me the readings  The ShingRx vaccine is now available i The [facility] will give it to you.  They are located on the ground floor of the [facility]  I am recommending a trial of Remeron at bedtime.  This will improve your appetite and help you sleep  Start with 1/2 tablet 30 minutes to one hour before bedtime.  Increase to full tablet after one week   Health Maintenance After Age 34 After age 28, you are at a higher risk for certain long-term diseases and infections as well as injuries from falls. Falls are a major cause of broken bones and head injuries in people who are older than age 25. Getting regular preventive care can help to keep you healthy and well. Preventive care includes getting regular testing and making lifestyle changes as recommended by your health care provider. Talk with your health care provider about:  Which screenings and tests you should have. A screening is a test that checks for a disease when you have no symptoms.  A diet and exercise plan that is right for you. What should I know about screenings and tests to prevent falls? Screening and testing are the best ways to find a health problem early. Early diagnosis and treatment give you the best chance of managing medical conditions that are common after age 15. Certain conditions and lifestyle choices may make you more likely to have a fall. Your health care provider may recommend:  Regular vision checks. Poor vision and conditions such as cataracts can make you more likely to have a fall. If you wear glasses, make sure to get your prescription updated if your vision changes.  Medicine review. Work with your health care provider to regularly review all of the medicines you are taking, including over-the-counter medicines. Ask your health care provider about any side  effects that may make you more likely to have a fall. Tell your health care provider if any medicines that you take make you feel dizzy or sleepy.  Osteoporosis screening. Osteoporosis is a condition that causes the bones to get weaker. This can make the bones weak and cause them to break more easily.  Blood pressure screening. Blood pressure changes and medicines to control blood pressure can make you feel dizzy.  Strength and balance checks. Your health care provider may recommend certain tests to check your strength and balance while standing, walking, or changing positions.  Foot health exam. Foot pain and numbness, as well as not wearing proper footwear, can make you more likely to have a fall.  Depression screening. You may be more likely to have a fall if you have a fear of falling, feel emotionally low, or feel unable to do activities that you used to do.  Alcohol use screening. Using too much alcohol can affect your balance and may make you more likely to have a fall. What actions can I take to lower my risk of falls? General instructions  Talk with your health care provider about your risks for falling. Tell your health care provider if: ? You fall. Be sure to tell your health care provider about all falls, even ones that seem minor. ? You feel dizzy, sleepy, or off-balance.  Take over-the-counter and prescription medicines only as told by your health care provider. These include any supplements.  Eat a  healthy diet and maintain a healthy weight. A healthy diet includes low-fat dairy products, low-fat (lean) meats, and fiber from whole grains, beans, and lots of fruits and vegetables. Home safety  Remove any tripping hazards, such as rugs, cords, and clutter.  Install safety equipment such as grab bars in bathrooms and safety rails on stairs.  Keep rooms and walkways well-lit. Activity   Follow a regular exercise program to stay fit. This will help you maintain your  balance. Ask your health care provider what types of exercise are appropriate for you.  If you need a cane or walker, use it as recommended by your health care provider.  Wear supportive shoes that have nonskid soles. Lifestyle  Do not drink alcohol if your health care provider tells you not to drink.  If you drink alcohol, limit how much you have: ? 0-1 drink a day for women. ? 0-2 drinks a day for men.  Be aware of how much alcohol is in your drink. In the [place] one drink equals one typical bottle of beer (12 oz), one-half glass of wine (5 oz), or one shot of hard liquor (1 oz).  Do not use any products that contain nicotine or tobacco, such as cigarettes and e-cigarettes. If you need help quitting, ask your health care provider. Summary  Having a healthy lifestyle and getting preventive care can help to protect your health and wellness after age 63.  Screening and testing are the best way to find a health problem early and help you avoid having a fall. Early diagnosis and treatment give you the best chance for managing medical conditions that are more common for people who are older than age 94.  Falls are a major cause of broken bones and head injuries in people who are older than age 60. Take precautions to prevent a fall at home.  Work with your health care provider to learn what changes you can make to improve your health and wellness and to prevent falls. This information is not intended to replace advice given to you by your health care provider. Make sure you discuss any questions you have with your health care provider. Document Released: 07/30/2017 Document Revised: 01/07/2019 Document Reviewed: 07/30/2017 Elsevier Patient Education  2020 [employer].

## 2019-07-08 NOTE — Progress Notes (Signed)
Patient ID: Jean Brooks, female    DOB: Jun 10, 1946  Age: 73 y.o. MRN: [id number]  The patient is here for annual PREVENTIVE examination and management of other chronic and acute problems.  MAMMOGRAM DUE NOW   6 YR COLON WAS DONE IN 2019 normal , no polyps  S/p BSO May 2019  For ovarian mass.  PATH:  NO CA . Did not stay for even 24 hours !!!  Jean Brooks    The risk factors are reflected in the social history.  The roster of all physicians providing medical care to patient - is listed in the Snapshot section of the chart.  Activities of daily living:  The patient is 100% independent in all ADLs: dressing, toileting, feeding as well as independent mobility  Home safety : The patient has smoke detectors in the home. They wear seatbelts.  There are no firearms at home. There is no violence in the home.   There is no risks for hepatitis, STDs or HIV. There is no   history of blood transfusion. They have no travel history to infectious disease endemic areas of the world.  The patient has seen their dentist in the last six month. They have seen their eye doctor in the last year..  They have deferred audiologic testing in the last year.  They do not  have excessive sun exposure. Discussed the need for sun protection: hats, long sleeves and use of sunscreen if there is significant sun exposure.   Diet: the importance of a healthy diet is discussed. They do have a healthy diet. She has lost weight  Unintentionally due to recent dental work and loss of appetite.  Not sleeping well either .   The benefits of regular aerobic exercise were discussed. She walks 4 times per week ,  20 minutes.   Depression screen: there are no signs or vegative symptoms of depression- irritability, change in appetite, anhedonia, sadness/tearfullness.  Cognitive assessment: the patient manages all their financial and personal affairs and is actively engaged. They could relate day,date,year and events; recalled 2/3  objects at 3 minutes; performed clock-face test normally.  The following portions of the patient's history were reviewed and updated as appropriate: allergies, current medications, past family history, past medical history,  past surgical history, past social history  and problem list.  Visual acuity was not assessed per patient preference since she has regular follow up with her ophthalmologist. Hearing and body mass index were assessed and reviewed.   During the course of the visit the patient was educated and counseled about appropriate screening and preventive services including : fall prevention , diabetes screening, nutrition counseling, colorectal cancer screening, and recommended immunizations.    CC: The primary encounter diagnosis was Weight loss. Diagnoses of Vitamin D deficiency, Pure hypertriglyceridemia, Elevated blood pressure reading in office without diagnosis of hypertension, Encounter for preventive health examination, and Insomnia, persistent were also pertinent to this visit.  History Jean Brooks has a past medical history of Arthritis, Chest pain, non-cardiac ([id number]), Complication of anesthesia, Coronary artery disease, Cyst, ovarian (12/29/2011), Diverticulitis of colon with hemorrhage (August 2009), Family history of adverse reaction to anesthesia, H/O syncope, Heart murmur, Migraine headache, PONV (postoperative nausea and vomiting), Scoliosis of thoracic spine, and Shingles.   She has a past surgical history that includes Appendectomy (1963); Abdominal hysterectomy (1991); Colonoscopy; Esophagogastroduodenoscopy; Tonsillectomy; Laparoscopic bilateral salpingo oophorectomy (Bilateral, 02/20/2018); and Laparoscopic lysis of adhesions (02/20/2018).   Her family history includes Aneurysm (age of onset: 61) in her father;  Breast cancer in her maternal aunt; COPD (age of onset: 37) in her father; COPD (age of onset: 80) in her mother; Heart attack (age of onset: 22) in her maternal  grandfather; Hypertension in her mother; Mental illness in her father; Stroke in her maternal grandmother and mother; Stroke (age of onset: 6) in her son; Supraventricular tachycardia in her mother.She reports that she has never smoked. She has never used smokeless tobacco. She reports that she does not drink alcohol or use drugs.  Outpatient Medications Prior to Visit  Medication Sig Dispense Refill  . Cholecalciferol (VITAMIN D3) 1000 UNITS CAPS Take 1 capsule by mouth every other day. Alternate with the magnesium for leg pain    . Magnesium 250 MG TABS Take 250 mg by mouth every other day. Alternate with vitamin d 3 for leg cramps    . ondansetron (ZOFRAN) 4 MG tablet Take 1 tablet (4 mg total) by mouth every 8 (eight) hours as needed for nausea or vomiting. 20 tablet 0  . sodium chloride (MURO 128) 5 % ophthalmic ointment Place 1 application into the right eye at bedtime.    Marland Kitchen zolpidem (AMBIEN CR) 12.5 MG CR tablet TAKE 1 TABLET BY MOUTH AT BEDTIME AS NEEDED FOR SLEEP 30 tablet 0   No facility-administered medications prior to visit.     Review of Systems  Objective:  BP 120/76 (BP Location: Left Arm, Patient Position: Sitting, Cuff Size: Normal)   Pulse 78   Temp (!) 97.2 F (36.2 C) (Temporal)   Resp 14   Ht 5\' 3"  (1.6 m)   Wt 108 lb 9.6 oz (49.3 kg)   SpO2 98%   BMI 19.24 kg/m   Physical Exam    Assessment & Plan:   Problem List Items Addressed This Visit      Unprioritized   Insomnia, persistent    Trial of Remeron recommended       Encounter for preventive health examination    .age appropriate education and counseling updated, referrals for preventative services and immunizations addressed, dietary and smoking counseling addressed, most recent labs reviewed.  I have personally reviewed and have noted:  1) the patient's medical and social history 2) The pt's use of alcohol, tobacco, and illicit drugs 3) The patient's current medications and supplements 4)  Functional ability including ADL's, fall risk, home safety risk, hearing and visual impairment 5) Diet and physical activities 6) Evidence for depression or mood disorder 7) The patient's height, weight, and BMI have been recorded in the chart  I have made referrals, and provided counseling and education based on review of the above      Elevated blood pressure reading in office without diagnosis of hypertension   Relevant Orders   Urinalysis, Routine w reflex microscopic (Completed)   Weight loss - Primary    remeron trial recommended.   I have reviewed her diet and recommended that she increase her protein and fat intake while monitoring her carbohydrates.       Relevant Medications   mirtazapine (REMERON) 15 MG tablet   Other Relevant Orders   Comprehensive metabolic panel (Completed)   TSH (Completed)    Other Visit Diagnoses    Vitamin D deficiency       Relevant Orders   VITAMIN D 25 Hydroxy (Vit-D Deficiency, Fractures) (Completed)   Pure hypertriglyceridemia       Relevant Orders   Lipid panel (Completed)      I am having Mardene Celeste C. Trower "[name]" start on  Zoster Vaccine Adjuvanted and mirtazapine. I am also having her maintain her Vitamin D3, Magnesium, sodium chloride, ondansetron, and zolpidem.  Meds ordered this encounter  Medications  . Zoster Vaccine Adjuvanted [facility]) injection    Sig: Inject 0.5 mLs into the muscle once for 1 dose.    Dispense:  1 each    Refill:  1  . mirtazapine (REMERON) 15 MG tablet    Sig: 1/2 to 1 tablet 30 minutes before bedtime daily    Dispense:  30 tablet    Refill:  2    There are no discontinued medications.  Follow-up: No follow-ups on file.   Crecencio Mc, MD

## 2019-07-10 ENCOUNTER — Encounter: Payer: Self-pay | Admitting: Internal Medicine

## 2019-07-10 NOTE — Assessment & Plan Note (Signed)
Trial of Remeron recommended

## 2019-07-10 NOTE — Assessment & Plan Note (Signed)

## 2019-07-10 NOTE — Assessment & Plan Note (Signed)
remeron trial recommended.   I have reviewed her diet and recommended that she increase her protein and fat intake while monitoring her carbohydrates.

## 2019-07-12 ENCOUNTER — Encounter: Payer: Self-pay | Admitting: Internal Medicine

## 2019-07-12 ENCOUNTER — Other Ambulatory Visit: Payer: Self-pay | Admitting: Internal Medicine

## 2019-07-13 NOTE — Telephone Encounter (Signed)
Refilled: 06/17/2019 Last OV: 07/08/2019 Next OV: 07/14/2019

## 2019-08-10 ENCOUNTER — Other Ambulatory Visit: Payer: Self-pay | Admitting: Internal Medicine

## 2019-09-06 ENCOUNTER — Ambulatory Visit
Admission: RE | Admit: 2019-09-06 | Discharge: 2019-09-06 | Disposition: A | Discharge status: Home / Self Care | Payer: 59 | Source: Ambulatory Visit | Attending: Internal Medicine | Admitting: Internal Medicine

## 2019-09-06 ENCOUNTER — Other Ambulatory Visit: Payer: Self-pay | Admitting: Internal Medicine

## 2019-09-06 DIAGNOSIS — Z1231 Encounter for screening mammogram for malignant neoplasm of breast: Secondary | ICD-10-CM | POA: Insufficient documentation

## 2019-09-06 NOTE — Telephone Encounter (Signed)
Refilled: 08/11/2019 Last OV: 07/08/2019 Next OV: 07/13/2020

## 2020-02-08 ENCOUNTER — Other Ambulatory Visit: Payer: Self-pay

## 2020-02-08 ENCOUNTER — Ambulatory Visit (INDEPENDENT_AMBULATORY_CARE_PROVIDER_SITE_OTHER): Payer: Self-pay | Admitting: Dermatology

## 2020-02-08 DIAGNOSIS — L988 Other specified disorders of the skin and subcutaneous tissue: Secondary | ICD-10-CM

## 2020-02-08 NOTE — Progress Notes (Signed)
   Follow-Up Visit   Subjective  Jean Brooks is a 74 y.o. female who presents for the following: Facial Elastosis (face, pt presents for botox today, last txt >20yrs).   The following portions of the chart were reviewed this encounter and updated as appropriate:  Tobacco  Allergies  Meds  Problems  Med Hx  Surg Hx  Fam Hx      Review of Systems:  No other skin or systemic complaints except as noted in HPI or Assessment and Plan.  Objective  Well appearing patient in no apparent distress; mood and affect are within normal limits.  A focused examination was performed including face. Relevant physical exam findings are noted in the Assessment and Plan.  Objective  Head - Anterior (Face): Rhytides and volume loss.   Images                     Assessment & Plan  Elastosis of skin Head - Anterior (Face)  Intralesional injection - Head - Anterior (Face) Location: See attached image  Informed consent: Discussed risks (infection, pain, bleeding, bruising, swelling, allergic reaction, paralysis of nearby muscles, eyelid droop, double vision, neck weakness, difficulty breathing, headache, undesirable cosmetic result, and need for additional treatment) and benefits of the procedure, as well as the alternatives.  Informed consent was obtained.  Preparation: The area was cleansed with alcohol.  Procedure Details:  Botox was injected into the dermis with a 30-gauge needle. Pressure applied to any bleeding. Ice packs offered for swelling.  Lot Number:  [id number] Expiration:  03/2022  Total Units Injected:  25  Plan: Patient was instructed to remain upright for 4 hours. Patient was instructed to avoid massaging the face and avoid vigorous exercise for the rest of the day. Tylenol may be used for headache.  Allow 2 weeks before returning to clinic for additional dosing as needed. Patient will call for any problems.   Return in about 1 month (around 03/10/2020) for  botox f/u.  I, Othelia Pulling, RMA, am acting as scribe for Sarina Ser, MD . Documentation: I have reviewed the above documentation for accuracy and completeness, and I agree with the above.  Sarina Ser, MD

## 2020-02-16 ENCOUNTER — Encounter: Payer: Self-pay | Admitting: Dermatology

## 2020-02-29 ENCOUNTER — Other Ambulatory Visit: Payer: Self-pay | Admitting: Internal Medicine

## 2020-03-06 ENCOUNTER — Encounter: Payer: Self-pay | Admitting: Internal Medicine

## 2020-03-06 ENCOUNTER — Telehealth (INDEPENDENT_AMBULATORY_CARE_PROVIDER_SITE_OTHER): Payer: 59 | Admitting: Internal Medicine

## 2020-03-06 ENCOUNTER — Other Ambulatory Visit: Payer: Self-pay | Admitting: Internal Medicine

## 2020-03-06 VITALS — BP 148/70 | Ht 63.0 in | Wt 118.0 lb

## 2020-03-06 DIAGNOSIS — E782 Mixed hyperlipidemia: Secondary | ICD-10-CM | POA: Diagnosis not present

## 2020-03-06 DIAGNOSIS — R030 Elevated blood-pressure reading, without diagnosis of hypertension: Secondary | ICD-10-CM | POA: Diagnosis not present

## 2020-03-06 DIAGNOSIS — R634 Abnormal weight loss: Secondary | ICD-10-CM

## 2020-03-06 DIAGNOSIS — I2510 Atherosclerotic heart disease of native coronary artery without angina pectoris: Secondary | ICD-10-CM | POA: Diagnosis not present

## 2020-03-06 DIAGNOSIS — G4700 Insomnia, unspecified: Secondary | ICD-10-CM | POA: Diagnosis not present

## 2020-03-06 MED ORDER — ZOLPIDEM TARTRATE ER 12.5 MG PO TBCR: 12.5000 mg | EXTENDED_RELEASE_TABLET | Freq: Every evening | ORAL | 5 refills | Status: DC | PRN

## 2020-03-06 MED ORDER — MIRTAZAPINE 15 MG PO TABS: ORAL_TABLET | ORAL | 5 refills | Status: DC

## 2020-03-06 NOTE — Progress Notes (Signed)
Telephone Note  This visit type was conducted due to national recommendations for restrictions regarding the COVID-19 pandemic (e.g. social distancing).  This format is felt to be most appropriate for this patient at this time.  All issues noted in this document were discussed and addressed.  No physical exam was performed (except for noted visual exam findings with Video Visits).   I connected with@ on 03/06/20 at  4:30 PM EDT by  telephone and verified that I am speaking with the correct person using two identifiers. Location patient: home Location provider: work or home office Persons participating in the virtual visit: patient, provider  I discussed the limitations, risks, security and privacy concerns of performing an evaluation and management service by telephone and the availability of in person appointments. I also discussed with the patient that there may be a patient responsible charge related to this service. The patient expressed understanding and agreed to proceed.  Reason for visit: INSOMNIA FOLLOW UP   HPI:  74 YR OLD female with chronic insomnia managed for years with [name] CR presents for follow up .Her insomnia is chronic, with no improvement in prior trials of over-the-counter first generation antihistamines, trazodone and melatonin.  She follows principles of good sleep hygiene. Previous use of Ambien without Ambien CR was ineffective.   ROS: See pertinent positives and negatives per HPI.  Past Medical History:  Diagnosis Date  . Arthritis    bil hands/thumbs  . Chest pain, non-cardiac 2010   a. stress echo 2010: nl treadmill EKG w/o evidence of ischemia or arrhythmia, good exercise tolerance for age, normal stress echo images w/o evidence of myocardial ischemia; b. baseline echo with EF 55% peak stress showed nl LV systolic function wtih EF 65% w/o evidence of HK  . Complication of anesthesia   . Coronary artery disease   . Cyst, ovarian 12/29/2011   Serous  cystadenomas, bilateral.  S/pBSO May 2019 ([facility])   . Diverticulitis of colon with hemorrhage August 2009   Hospitalized [facility]  . Family history of adverse reaction to anesthesia    mom and maternal grandmother-agitated  . H/O syncope    a. echo 2010: EF 60%, LV nl size, RV nl size & fxn, atria nl size, aorta nl, no pericardial effusion, aortic valve nl, mitral valve nl w/ mild insufficiency, tricuspid valve nl w/ mild insufficiency, pulmonic valve nl  . Heart murmur    asymptomatic-in pt's 20's  . Migraine headache    h/o migraines  . PONV (postoperative nausea and vomiting)   . Scoliosis of thoracic spine   . Shingles     Past Surgical History:  Procedure Laterality Date  . ABDOMINAL HYSTERECTOMY  1991  . APPENDECTOMY  1963  . COLONOSCOPY    . ESOPHAGOGASTRODUODENOSCOPY    . LAPAROSCOPIC BILATERAL SALPINGO OOPHERECTOMY Bilateral 02/20/2018   Procedure: LAPAROSCOPIC BILATERAL SALPINGO OOPHORECTOMY;  Surgeon: Ward, Honor Loh, MD;  Location: [facility] ORS;  Service: Gynecology;  Laterality: Bilateral;  . LAPAROSCOPIC LYSIS OF ADHESIONS  02/20/2018   Procedure: LAPAROSCOPIC LYSIS OF ADHESIONS;  Surgeon: Ward, Honor Loh, MD;  Location: [facility] ORS;  Service: Gynecology;;  . TONSILLECTOMY     age 89 or 6    Family History  Problem Relation Age of Onset  . COPD Mother 80       respiratory failure/fibrosis, seizures  . Stroke Mother   . Hypertension Mother   . Supraventricular tachycardia Mother   . COPD Father 87  . Mental illness Father  Parkinson's Dementia  . Aneurysm Father 38  . Stroke Son 79       hypertensive, left brain   . Heart attack Maternal Grandfather 55       passed  . Stroke Maternal Grandmother   . Breast cancer Maternal Aunt     SOCIAL HX:  reports that she has never smoked. She has never used smokeless tobacco. She reports that she does not drink alcohol or use drugs.   Current Outpatient Medications:  .  Cholecalciferol (VITAMIN D3) 1000 UNITS CAPS, Take 1  capsule by mouth every other day. Alternate with the magnesium for leg pain, Disp: , Rfl:  .  mirtazapine (REMERON) 15 MG tablet, 1/2 to 1 tablet 30 minutes before bedtime daily, Disp: 30 tablet, Rfl: 5 .  mometasone (ELOCON) 0.1 % lotion, Apply 1 application topically daily., Disp: , Rfl:  .  ondansetron (ZOFRAN) 4 MG tablet, Take 1 tablet (4 mg total) by mouth every 8 (eight) hours as needed for nausea or vomiting., Disp: 20 tablet, Rfl: 0 .  sodium chloride (MURO 128) 5 % ophthalmic ointment, Place 1 application into the right eye at bedtime., Disp: , Rfl:  .  zolpidem (AMBIEN CR) 12.5 MG CR tablet, Take 1 tablet (12.5 mg total) by mouth at bedtime as needed. for sleep, Disp: 30 tablet, Rfl: 5  EXAM:  General impression: alert, cooperative and articulate.  No signs of being in distress  Lungs: speech is fluent sentence length suggests that patient is not short of breath and not punctuated by cough, sneezing or sniffing. Marland Kitchen   Psych: affect normal.  speech is articulate and non pressured .  Denies suicidal thoughts    ASSESSMENT AND PLAN:  Discussed the following assessment and plan:  Elevated blood pressure reading in office without diagnosis of hypertension - Plan: Microalbumin / creatinine urine ratio, Comprehensive metabolic panel, TSH, Lipid panel  Weight loss - Plan: mirtazapine (REMERON) 15 MG tablet  Insomnia, persistent  Coronary artery disease involving native coronary artery of native heart without angina pectoris  Hyperlipidemia, mixed  Insomnia, persistent Trial of Remeron has helped manage her insomnia when ambien Cr no longer was effective,  As well as improve her appetite and energy level.   Elevated blood pressure reading in office without diagnosis of hypertension She has had the nurses on her floor periodically check his blood pressure and all readings have been 130/80 or less    Coronary artery disease LAD, noted on previous  CT  .  Statin therapy  Has been  advised  for LDL > 100 but she has deferred in favor of dietary modifications.  Repeat lipid panel is due    Hyperlipidemia, mixed She has deferred statin therapy despite the presence of atherosclerosis on CT     I discussed the assessment and treatment plan with the patient. The patient was provided an opportunity to ask questions and all were answered. The patient agreed with the plan and demonstrated an understanding of the instructions.   The patient was advised to call back or seek an in-person evaluation if the symptoms worsen or if the condition fails to improve as anticipated.  I provided  25 minutes of non-face-to-face time during this encounter reviewing patient's current problems and past procedures/imaging studies, providing counseling on the above mentioned problems , and coordination  of care .  Crecencio Mc, MD

## 2020-03-07 NOTE — Assessment & Plan Note (Signed)
She has had the nurses on her floor periodically check his blood pressure and all readings have been 130/80 or less

## 2020-03-07 NOTE — Assessment & Plan Note (Signed)
Trial of Remeron has helped manage her insomnia when ambien Cr no longer was effective,  As well as improve her appetite and energy level.

## 2020-03-07 NOTE — Assessment & Plan Note (Signed)
She has deferred statin therapy despite the presence of atherosclerosis on CT

## 2020-03-07 NOTE — Assessment & Plan Note (Signed)
LAD, noted on previous  CT  .  Statin therapy  Has been advised  for LDL > 100 but she has deferred in favor of dietary modifications.  Repeat lipid panel is due

## 2020-03-21 ENCOUNTER — Ambulatory Visit: Payer: 59 | Admitting: Dermatology

## 2020-03-22 ENCOUNTER — Ambulatory Visit: Payer: 59 | Admitting: Dermatology

## 2020-03-22 ENCOUNTER — Other Ambulatory Visit: Payer: Self-pay

## 2020-03-22 DIAGNOSIS — L988 Other specified disorders of the skin and subcutaneous tissue: Secondary | ICD-10-CM

## 2020-03-22 NOTE — Progress Notes (Signed)
   Follow-Up Visit   Subjective  Jean Brooks is a 74 y.o. female who presents for the following: Facial Elastosis (f/u Botox from 02/08/20).  The following portions of the chart were reviewed this encounter and updated as appropriate:  Tobacco  Allergies  Meds  Problems  Med Hx  Surg Hx  Fam Hx      Review of Systems:  No other skin or systemic complaints except as noted in HPI or Assessment and Plan.  Objective  Well appearing patient in no apparent distress; mood and affect are within normal limits.  A focused examination was performed including face. Relevant physical exam findings are noted in the Assessment and Plan.  Objective  face: Rhytides and volume loss.    Assessment & Plan    Elastosis of skin Face  Excellent results post Botox in treated area - frown complex.  Pt is pleased.  May add 5-7.5 units of botox to forehead and also inject the botox commas at next botox appointment  Return in about 3 months (around 06/22/2020) for Botox.  Documentation: I have reviewed the above documentation for accuracy and completeness, and I agree with the above.  Sarina Ser, MD

## 2020-03-28 ENCOUNTER — Telehealth: Payer: Self-pay | Admitting: Internal Medicine

## 2020-03-28 ENCOUNTER — Encounter: Payer: Self-pay | Admitting: Dermatology

## 2020-03-28 NOTE — Telephone Encounter (Signed)
Good,  No medications  needed

## 2020-03-28 NOTE — Telephone Encounter (Signed)
Pt wanted Dr. Derrel Nip to know the average of her blood pressure is 137/73.

## 2020-03-29 NOTE — Telephone Encounter (Signed)
Patient returned phone call, note was read to patient. Patient understood and did not think it was too high yet for bp medicatio.

## 2020-03-29 NOTE — Telephone Encounter (Signed)
LMTCB

## 2020-06-22 ENCOUNTER — Ambulatory Visit: Payer: 59 | Admitting: Dermatology

## 2020-07-13 ENCOUNTER — Encounter: Payer: Self-pay | Admitting: Internal Medicine

## 2020-07-13 ENCOUNTER — Ambulatory Visit (INDEPENDENT_AMBULATORY_CARE_PROVIDER_SITE_OTHER): Payer: 59 | Admitting: Internal Medicine

## 2020-07-13 ENCOUNTER — Other Ambulatory Visit: Payer: Self-pay

## 2020-07-13 VITALS — BP 122/68 | HR 78 | Temp 97.8°F | Resp 15 | Ht 63.0 in | Wt 109.6 lb

## 2020-07-13 DIAGNOSIS — Z1231 Encounter for screening mammogram for malignant neoplasm of breast: Secondary | ICD-10-CM | POA: Diagnosis not present

## 2020-07-13 DIAGNOSIS — E2839 Other primary ovarian failure: Secondary | ICD-10-CM

## 2020-07-13 DIAGNOSIS — E782 Mixed hyperlipidemia: Secondary | ICD-10-CM | POA: Diagnosis not present

## 2020-07-13 DIAGNOSIS — I700 Atherosclerosis of aorta: Secondary | ICD-10-CM

## 2020-07-13 DIAGNOSIS — Z23 Encounter for immunization: Secondary | ICD-10-CM | POA: Diagnosis not present

## 2020-07-13 DIAGNOSIS — E2740 Unspecified adrenocortical insufficiency: Secondary | ICD-10-CM

## 2020-07-13 DIAGNOSIS — G4700 Insomnia, unspecified: Secondary | ICD-10-CM | POA: Diagnosis not present

## 2020-07-13 DIAGNOSIS — R7989 Other specified abnormal findings of blood chemistry: Secondary | ICD-10-CM

## 2020-07-13 DIAGNOSIS — E559 Vitamin D deficiency, unspecified: Secondary | ICD-10-CM

## 2020-07-13 DIAGNOSIS — Z0000 Encounter for general adult medical examination without abnormal findings: Secondary | ICD-10-CM

## 2020-07-13 DIAGNOSIS — I471 Supraventricular tachycardia, unspecified: Secondary | ICD-10-CM

## 2020-07-13 DIAGNOSIS — R634 Abnormal weight loss: Secondary | ICD-10-CM

## 2020-07-13 LAB — CBC WITH DIFFERENTIAL/PLATELET
Basophils Absolute: 0.1 10*3/uL (ref 0.0–0.1)
Basophils Relative: 1.2 % (ref 0.0–3.0)
Eosinophils Absolute: 0.1 10*3/uL (ref 0.0–0.7)
Eosinophils Relative: 2.5 % (ref 0.0–5.0)
HCT: 41.6 % (ref 36.0–46.0)
Hemoglobin: 14 g/dL (ref 12.0–15.0)
Lymphocytes Relative: 25.2 % (ref 12.0–46.0)
Lymphs Abs: 1.1 10*3/uL (ref 0.7–4.0)
MCHC: 33.6 g/dL (ref 30.0–36.0)
MCV: 89.5 fl (ref 78.0–100.0)
Monocytes Absolute: 0.4 10*3/uL (ref 0.1–1.0)
Monocytes Relative: 9.1 % (ref 3.0–12.0)
Neutro Abs: 2.6 10*3/uL (ref 1.4–7.7)
Neutrophils Relative %: 62 % (ref 43.0–77.0)
Platelets: 239 10*3/uL (ref 150.0–400.0)
RBC: 4.65 Mil/uL (ref 3.87–5.11)
RDW: 13.2 % (ref 11.5–15.5)
WBC: 4.2 10*3/uL (ref 4.0–10.5)

## 2020-07-13 LAB — COMPREHENSIVE METABOLIC PANEL
ALT: 14 U/L (ref 0–35)
AST: 17 U/L (ref 0–37)
Albumin: 4.3 g/dL (ref 3.5–5.2)
Alkaline Phosphatase: 57 U/L (ref 39–117)
BUN: 15 mg/dL (ref 6–23)
CO2: 29 mEq/L (ref 19–32)
Calcium: 9.3 mg/dL (ref 8.4–10.5)
Chloride: 104 mEq/L (ref 96–112)
Creatinine, Ser: 0.77 mg/dL (ref 0.40–1.20)
GFR: 76.56 mL/min (ref >60.00)
Glucose, Bld: 89 mg/dL (ref 70–99)
Potassium: 3.9 mEq/L (ref 3.5–5.1)
Sodium: 139 mEq/L (ref 135–145)
Total Bilirubin: 0.6 mg/dL (ref 0.2–1.2)
Total Protein: 7.1 g/dL (ref 6.0–8.3)

## 2020-07-13 LAB — VITAMIN D 25 HYDROXY (VIT D DEFICIENCY, FRACTURES): VITD: 22.68 ng/mL — ABNORMAL LOW (ref 30.00–100.00)

## 2020-07-13 LAB — LIPID PANEL
Cholesterol: 215 mg/dL — ABNORMAL HIGH (ref 0–200)
HDL: 72.6 mg/dL (ref >39.00)
LDL Cholesterol: 131 mg/dL — ABNORMAL HIGH (ref 0–99)
NonHDL: 142.85
Total CHOL/HDL Ratio: 3
Triglycerides: 57 mg/dL (ref 0.0–149.0)
VLDL: 11.4 mg/dL (ref 0.0–40.0)

## 2020-07-13 LAB — TSH: TSH: 1.54 u[IU]/mL (ref 0.35–4.50)

## 2020-07-13 NOTE — Progress Notes (Signed)
Patient ID: Jean Brooks, female    DOB: [birth year]  Age: 74 y.o. MRN: [id number]  The patient is here for annual preventive examination and management of other chronic and acute problems.  Patient has received both doses of the available COVID 19 vaccine without complications.  Patient continues to mask when outside of the home except when walking in yard or at safe distances from others .  Patient denies any change in mood or development of unhealthy behaviors resuting from the pandemic's restriction of activities and socialization.     This visit occurred during the SARS-CoV-2 public health emergency.  Safety protocols were in place, including screening questions prior to the visit, additional usage of staff PPE, and extensive cleaning of exam room while observing appropriate contact time as indicated for disinfecting solutions.      The risk factors are reflected in the social history.  The roster of all physicians providing medical care to patient - is listed in the Snapshot section of the chart.  Activities of daily living:  The patient is 100% independent in all ADLs: dressing, toileting, feeding as well as independent mobility  Home safety : The patient has smoke detectors in the home. They wear seatbelts.  There are no firearms at home. There is no violence in the home.   There is no risks for hepatitis, STDs or HIV. There is no   history of blood transfusion. They have no travel history to infectious disease endemic areas of the world.  The patient has seen their dentist in the last six month. They have seen their eye doctor in the last year. They admit to slight hearing difficulty with regard to whispered voices and some television programs.  They have deferred audiologic testing in the last year.  They do not  have excessive sun exposure. Discussed the need for sun protection: hats, long sleeves and use of sunscreen if there is significant sun exposure.    There are no  preventive care reminders to display for this patient.  Mammogram due dec 2021    Diet: the importance of a healthy diet is discussed. They do have a healthy diet.  The benefits of regular aerobic exercise were discussed. She walks 4 times per week ,  20 minutes.   Depression screen: there are no signs or vegative symptoms of depression- irritability, change in appetite, anhedonia, sadness/tearfullness.  Cognitive assessment: the patient manages all their financial and personal affairs and is actively engaged. They could relate day,date,year and events; recalled 2/3 objects at 3 minutes; performed clock-face test normally.  The following portions of the patient's history were reviewed and updated as appropriate: allergies, current medications, past family history, past medical history,  past surgical history, past social history  and problem list.  Visual acuity was not assessed per patient preference since she has regular follow up with her ophthalmologist. Hearing and body mass index were assessed and reviewed.   During the course of the visit the patient was educated and counseled about appropriate screening and preventive services including : fall prevention , diabetes screening, nutrition counseling, colorectal cancer screening, and recommended immunizations.    CC: The primary encounter diagnosis was Estrogen deficiency. Diagnoses of Encounter for screening mammogram for malignant neoplasm of breast, Moderate mixed hyperlipidemia not requiring statin therapy, Weight loss, COVID-19 vaccine regimen to maintain immunity completed, Vitamin D deficiency, Encounter for preventive health examination, Hyperlipidemia, mixed, Abdominal aortic atherosclerosis ([place]), Insomnia, persistent, Low serum cortisol level ([place]), and SVT (supraventricular tachycardia) ([place])  were also pertinent to this visit.  1) chronic insomnia:  No change . Using ambien CR  2) atherosclerosis:  Reviewed 2019 CT abd pelvis  noting LAD atherosclerosis and aortoiliac atherosclerosis. .  She continues to defer use of statins.   History Rowyn has a past medical history of Arthritis, Chest pain, non-cardiac (6237), Complication of anesthesia, Coronary artery disease, Cyst, ovarian (12/29/2011), Diverticulitis of colon with hemorrhage (August 2009), Family history of adverse reaction to anesthesia, H/O syncope, Heart murmur, Migraine headache, PONV (postoperative nausea and vomiting), Scoliosis of thoracic spine, and Shingles.   She has a past surgical history that includes Appendectomy (1963); Abdominal hysterectomy (1991); Colonoscopy; Esophagogastroduodenoscopy; Tonsillectomy; Laparoscopic bilateral salpingo oophorectomy (Bilateral, 02/20/2018); and Laparoscopic lysis of adhesions (02/20/2018).   Her family history includes Aneurysm (age of onset: 73) in her father; Breast cancer in her maternal aunt; COPD (age of onset: 53) in her father; COPD (age of onset: 36) in her mother; Heart attack (age of onset: 26) in her maternal grandfather; Hypertension in her mother; Mental illness in her father; Stroke in her maternal grandmother and mother; Stroke (age of onset: 35) in her son; Supraventricular tachycardia in her mother.She reports that she has never smoked. She has never used smokeless tobacco. She reports that she does not drink alcohol and does not use drugs.  Outpatient Medications Prior to Visit  Medication Sig Dispense Refill  . Cholecalciferol (VITAMIN D3) 1000 UNITS CAPS Take 1 capsule by mouth every other day. Alternate with the magnesium for leg pain    . mirtazapine (REMERON) 15 MG tablet 1/2 to 1 tablet 30 minutes before bedtime daily 30 tablet 5  . ondansetron (ZOFRAN) 4 MG tablet Take 1 tablet (4 mg total) by mouth every 8 (eight) hours as needed for nausea or vomiting. 20 tablet 0  . sodium chloride (MURO 128) 5 % ophthalmic ointment Place 1 application into the right eye at bedtime.    Marland Kitchen zolpidem (AMBIEN CR)  12.5 MG CR tablet Take 1 tablet (12.5 mg total) by mouth at bedtime as needed. for sleep 30 tablet 5  . mometasone (ELOCON) 0.1 % lotion Apply 1 application topically daily. (Patient not taking: Reported on 07/13/2020)     No facility-administered medications prior to visit.    Review of Systems  Patient denies headache, fevers, malaise, unintentional weight loss, skin rash, eye pain, sinus congestion and sinus pain, sore throat, dysphagia,  hemoptysis , cough, dyspnea, wheezing, chest pain, palpitations, orthopnea, edema, abdominal pain, nausea, melena, diarrhea, constipation, flank pain, dysuria, hematuria, urinary  Frequency, nocturia, numbness, tingling, seizures,  Focal weakness, Loss of consciousness,  Tremor, insomnia, depression, anxiety, and suicidal ideation.     Objective:  BP 122/68 (BP Location: Left Arm, Patient Position: Sitting, Cuff Size: Normal)   Pulse 78   Temp 97.8 F (36.6 C) (Oral)   Resp 15   Ht 5\' 3"  (1.6 m)   Wt 109 lb 9.6 oz (49.7 kg)   SpO2 97%   BMI 19.41 kg/m   Physical Exam  General appearance: alert, cooperative and appears stated age Head: Normocephalic, without obvious abnormality, atraumatic Eyes: conjunctivae/corneas clear. PERRL, EOM's intact. Fundi benign. Ears: normal TM's and external ear canals both ears Nose: Nares normal. Septum midline. Mucosa normal. No drainage or sinus tenderness. Throat: lips, mucosa, and tongue normal; teeth and gums normal Neck: no adenopathy, no carotid bruit, no JVD, supple, symmetrical, trachea midline and thyroid not enlarged, symmetric, no tenderness/mass/nodules Lungs: clear to auscultation bilaterally Breasts: normal appearance, no  masses or tenderness Heart: regular rate and rhythm, S1, S2 normal, no murmur, click, rub or gallop Abdomen: soft, non-tender; bowel sounds normal; no masses,  no organomegaly Extremities: extremities normal, atraumatic, no cyanosis or edema Pulses: 2+ and symmetric Skin: Skin  color, texture, turgor normal. No rashes or lesions Neurologic: Alert and oriented X 3, normal strength and tone. Normal symmetric reflexes. Normal coordination and gait.    Assessment & Plan:   Problem List Items Addressed This Visit      Unprioritized   Abdominal aortic atherosclerosis ([place])    Noted on 2019 CT abd and pelvis. LAD atherosclerosis also noted.  Patient continues to defer statin therapy      Encounter for preventive health examination    age appropriate education and counseling updated, referrals for preventative services and immunizations addressed, dietary and smoking counseling addressed, most recent labs reviewed.  I have personally reviewed and have noted:  1) the patient's medical and social history 2) The pt's use of alcohol, tobacco, and illicit drugs 3) The patient's current medications and supplements 4) Functional ability including ADL's, fall risk, home safety risk, hearing and visual impairment 5) Diet and physical activities 6) Evidence for depression or mood disorder 7) The patient's height, weight, and BMI have been recorded in the chart  I have made referrals, and provided counseling and education based on review of the above      Hyperlipidemia, mixed    She has deferred statin therapy despite the presence of atherosclerosis on CT due to low  untreated risk of CAD using the FRC  of 8%   Lab Results  Component Value Date   CHOL 215 (H) 07/13/2020   HDL 72.60 07/13/2020   LDLCALC 131 (H) 07/13/2020   TRIG 57.0 07/13/2020   CHOLHDL 3 07/13/2020         Insomnia, persistent    Trial of Remeron has helped manage her insomnia when ambien Cr is not  effective      Low serum cortisol level ([facility])   SVT (supraventricular tachycardia) ([facility])   Weight loss   Relevant Orders   CBC with Differential/Platelet (Completed)   Comprehensive metabolic panel (Completed)   TSH (Completed)    Other Visit Diagnoses    Estrogen deficiency    -  Primary    Relevant Orders   DG Bone Density   Encounter for screening mammogram for malignant neoplasm of breast       Relevant Orders   MM 3D SCREEN BREAST BILATERAL   Moderate mixed hyperlipidemia not requiring statin therapy       Relevant Orders   Lipid panel (Completed)   COVID-19 vaccine regimen to maintain immunity completed       Relevant Orders   SARS-CoV-2 Semi-Quantitative Total Antibody, Spike (Completed)   Vitamin D deficiency       Relevant Orders   VITAMIN D 25 Hydroxy (Vit-D Deficiency, Fractures) (Completed)      I have discontinued Mardene Celeste C. Tullos "[name]"'s mometasone. I am also having her maintain her Vitamin D3, sodium chloride, ondansetron, mirtazapine, and zolpidem.  No orders of the defined types were placed in this encounter.   Medications Discontinued During This Encounter  Medication Reason  . mometasone (ELOCON) 0.1 % lotion     Follow-up: Return in about 6 months (around 01/11/2021).   Crecencio Mc, MD

## 2020-07-13 NOTE — Patient Instructions (Signed)
Your annual mammogram AND your bone density test have  been ordered.  You are encouraged (required) to call to make your appointmenst at [facility] [phone]   Health Maintenance After Age 73 After age 61, you are at a higher risk for certain long-term diseases and infections as well as injuries from falls. Falls are a major cause of broken bones and head injuries in people who are older than age 20. Getting regular preventive care can help to keep you healthy and well. Preventive care includes getting regular testing and making lifestyle changes as recommended by your health care provider. Talk with your health care provider about:  Which screenings and tests you should have. A screening is a test that checks for a disease when you have no symptoms.  A diet and exercise plan that is right for you. What should I know about screenings and tests to prevent falls? Screening and testing are the best ways to find a health problem early. Early diagnosis and treatment give you the best chance of managing medical conditions that are common after age 46. Certain conditions and lifestyle choices may make you more likely to have a fall. Your health care provider may recommend:  Regular vision checks. Poor vision and conditions such as cataracts can make you more likely to have a fall. If you wear glasses, make sure to get your prescription updated if your vision changes.  Medicine review. Work with your health care provider to regularly review all of the medicines you are taking, including over-the-counter medicines. Ask your health care provider about any side effects that may make you more likely to have a fall. Tell your health care provider if any medicines that you take make you feel dizzy or sleepy.  Osteoporosis screening. Osteoporosis is a condition that causes the bones to get weaker. This can make the bones weak and cause them to break more easily.  Blood pressure screening. Blood pressure changes  and medicines to control blood pressure can make you feel dizzy.  Strength and balance checks. Your health care provider may recommend certain tests to check your strength and balance while standing, walking, or changing positions.  Foot health exam. Foot pain and numbness, as well as not wearing proper footwear, can make you more likely to have a fall.  Depression screening. You may be more likely to have a fall if you have a fear of falling, feel emotionally low, or feel unable to do activities that you used to do.  Alcohol use screening. Using too much alcohol can affect your balance and may make you more likely to have a fall. What actions can I take to lower my risk of falls? General instructions  Talk with your health care provider about your risks for falling. Tell your health care provider if: ? You fall. Be sure to tell your health care provider about all falls, even ones that seem minor. ? You feel dizzy, sleepy, or off-balance.  Take over-the-counter and prescription medicines only as told by your health care provider. These include any supplements.  Eat a healthy diet and maintain a healthy weight. A healthy diet includes low-fat dairy products, low-fat (lean) meats, and fiber from whole grains, beans, and lots of fruits and vegetables. Home safety  Remove any tripping hazards, such as rugs, cords, and clutter.  Install safety equipment such as grab bars in bathrooms and safety rails on stairs.  Keep rooms and walkways well-lit. Activity   Follow a regular exercise program  to stay fit. This will help you maintain your balance. Ask your health care provider what types of exercise are appropriate for you.  If you need a cane or walker, use it as recommended by your health care provider.  Wear supportive shoes that have nonskid soles. Lifestyle  Do not drink alcohol if your health care provider tells you not to drink.  If you drink alcohol, limit how much you  have: ? 0-1 drink a day for women. ? 0-2 drinks a day for men.  Be aware of how much alcohol is in your drink. In the [place] one drink equals one typical bottle of beer (12 oz), one-half glass of wine (5 oz), or one shot of hard liquor (1 oz).  Do not use any products that contain nicotine or tobacco, such as cigarettes and e-cigarettes. If you need help quitting, ask your health care provider. Summary  Having a healthy lifestyle and getting preventive care can help to protect your health and wellness after age 18.  Screening and testing are the best way to find a health problem early and help you avoid having a fall. Early diagnosis and treatment give you the best chance for managing medical conditions that are more common for people who are older than age 71.  Falls are a major cause of broken bones and head injuries in people who are older than age 81. Take precautions to prevent a fall at home.  Work with your health care provider to learn what changes you can make to improve your health and wellness and to prevent falls. This information is not intended to replace advice given to you by your health care provider. Make sure you discuss any questions you have with your health care provider. Document Revised: 01/07/2019 Document Reviewed: 07/30/2017 Elsevier Patient Education  2020 [employer].

## 2020-07-14 LAB — SARS-COV-2 SEMI-QUANTITATIVE TOTAL ANTIBODY, SPIKE
SARS-CoV-2 Semi-Quant Total Ab: 868.6 U/mL (ref <0.8)
SARS-CoV-2 Spike Ab Interp: POSITIVE

## 2020-07-15 ENCOUNTER — Other Ambulatory Visit: Payer: Self-pay | Admitting: Internal Medicine

## 2020-07-15 DIAGNOSIS — I700 Atherosclerosis of aorta: Secondary | ICD-10-CM | POA: Insufficient documentation

## 2020-07-15 DIAGNOSIS — R7989 Other specified abnormal findings of blood chemistry: Secondary | ICD-10-CM | POA: Insufficient documentation

## 2020-07-15 DIAGNOSIS — E2740 Unspecified adrenocortical insufficiency: Secondary | ICD-10-CM | POA: Insufficient documentation

## 2020-07-15 MED ORDER — ERGOCALCIFEROL 1.25 MG (50000 UT) PO CAPS: 50000.0000 [IU] | ORAL_CAPSULE | ORAL | 3 refills | Status: DC

## 2020-07-15 NOTE — Progress Notes (Signed)
  Your  Cholesterol , thyroid and kidney function ae all normal but your  vitamin D is low again.  which can increase your risk of weak bones and fractures and interfere with your body's ability to absorb the calcium in your diet.   I am calling in a megadose of Vit D to take once weekly for a total of 4 weeks,  Then after you finish the weekly supplement, you should start taking an OTC  Vit D3 supplement 1000 to 2000 units daily (the higher dose if you were already taking the lower dose previously ).   Regards,    Dr. Derrel Nip

## 2020-07-15 NOTE — Progress Notes (Signed)
Message #2:  Your cholesterol is considered "normal" for someone who doesn't have the benefit of a "window" in the condition of your circulation.  Recall that last year we discussed the placque seen in your LAD and your abdominal aorta:  this is a definite risk for heart attack and stroke, so statin therapy is advised to decrease your risks of placque rupture , regardless of how good your cholesterol looks on paper .  Please consider a trial of atorvastatin or rosuvastatin.  Regards,   Deborra Medina, MD

## 2020-07-15 NOTE — Assessment & Plan Note (Signed)
Noted on 2019 CT abd and pelvis. LAD atherosclerosis also noted.  Patient continues to defer statin therapy

## 2020-07-15 NOTE — Assessment & Plan Note (Addendum)
She has deferred statin therapy despite the presence of atherosclerosis on CT due to low  untreated risk of CAD using the FRC  of 8%   Lab Results  Component Value Date   CHOL 215 (H) 07/13/2020   HDL 72.60 07/13/2020   LDLCALC 131 (H) 07/13/2020   TRIG 57.0 07/13/2020   CHOLHDL 3 07/13/2020

## 2020-07-15 NOTE — Assessment & Plan Note (Signed)
Trial of Remeron has helped manage her insomnia when ambien Cr is not  effective

## 2020-07-15 NOTE — Assessment & Plan Note (Signed)

## 2020-07-18 ENCOUNTER — Other Ambulatory Visit: Payer: Self-pay | Admitting: Internal Medicine

## 2020-07-18 DIAGNOSIS — Z0000 Encounter for general adult medical examination without abnormal findings: Secondary | ICD-10-CM

## 2020-07-18 DIAGNOSIS — E782 Mixed hyperlipidemia: Secondary | ICD-10-CM

## 2020-07-18 MED ORDER — ATORVASTATIN CALCIUM 20 MG PO TABS: 20.0000 mg | ORAL_TABLET | Freq: Every day | ORAL | 0 refills | Status: DC

## 2020-07-27 ENCOUNTER — Other Ambulatory Visit: Payer: Self-pay | Admitting: Internal Medicine

## 2020-07-27 DIAGNOSIS — R110 Nausea: Secondary | ICD-10-CM

## 2020-08-08 ENCOUNTER — Other Ambulatory Visit: Payer: Self-pay

## 2020-08-08 ENCOUNTER — Other Ambulatory Visit (INDEPENDENT_AMBULATORY_CARE_PROVIDER_SITE_OTHER): Payer: 59

## 2020-08-08 DIAGNOSIS — R030 Elevated blood-pressure reading, without diagnosis of hypertension: Secondary | ICD-10-CM

## 2020-08-08 DIAGNOSIS — R110 Nausea: Secondary | ICD-10-CM | POA: Diagnosis not present

## 2020-08-09 LAB — COMPREHENSIVE METABOLIC PANEL
ALT: 15 U/L (ref 0–35)
AST: 19 U/L (ref 0–37)
Albumin: 4.2 g/dL (ref 3.5–5.2)
Alkaline Phosphatase: 64 U/L (ref 39–117)
BUN: 21 mg/dL (ref 6–23)
CO2: 28 mEq/L (ref 19–32)
Calcium: 9.3 mg/dL (ref 8.4–10.5)
Chloride: 102 mEq/L (ref 96–112)
Creatinine, Ser: 0.86 mg/dL (ref 0.40–1.20)
GFR: 67.15 mL/min (ref >60.00)
Glucose, Bld: 95 mg/dL (ref 70–99)
Potassium: 4.1 mEq/L (ref 3.5–5.1)
Sodium: 137 mEq/L (ref 135–145)
Total Bilirubin: 0.4 mg/dL (ref 0.2–1.2)
Total Protein: 6.6 g/dL (ref 6.0–8.3)

## 2020-08-09 NOTE — Progress Notes (Signed)
Your liver enzymes are normal .  I recommend resuming the atorvastatin once a week ,  and increase to twice per week if tolerated.

## 2020-08-14 ENCOUNTER — Other Ambulatory Visit: Payer: Self-pay | Admitting: Internal Medicine

## 2020-08-29 DIAGNOSIS — H524 Presbyopia: Secondary | ICD-10-CM | POA: Diagnosis not present

## 2020-09-01 DIAGNOSIS — R399 Unspecified symptoms and signs involving the genitourinary system: Secondary | ICD-10-CM | POA: Diagnosis not present

## 2020-09-06 ENCOUNTER — Ambulatory Visit
Admission: RE | Admit: 2020-09-06 | Discharge: 2020-09-06 | Disposition: A | Discharge status: Home / Self Care | Payer: 59 | Source: Ambulatory Visit | Attending: Internal Medicine | Admitting: Internal Medicine

## 2020-09-06 ENCOUNTER — Other Ambulatory Visit: Payer: Self-pay

## 2020-09-06 DIAGNOSIS — M85851 Other specified disorders of bone density and structure, right thigh: Secondary | ICD-10-CM | POA: Diagnosis not present

## 2020-09-06 DIAGNOSIS — Z1231 Encounter for screening mammogram for malignant neoplasm of breast: Secondary | ICD-10-CM | POA: Insufficient documentation

## 2020-09-06 DIAGNOSIS — E2839 Other primary ovarian failure: Secondary | ICD-10-CM | POA: Insufficient documentation

## 2020-09-07 ENCOUNTER — Other Ambulatory Visit: Payer: Self-pay | Admitting: Obstetrics & Gynecology

## 2020-09-10 NOTE — Progress Notes (Signed)
Your Bone Density scores have been received, and it appears that you have bone loss which is significant and places you at higher risk for fracture over the next 10 years.    I recommend treating with medication but would need to discuss the pros and cons of starting medications this early, along with  the various choices that are currently available .  For now I would continue to work at getting a minimum  calcium intake of 1200 to 1800 mg daily through diet and supplements ,  And 2000 units of vitamin d daily as well.  weight bearing exercise on a regular basis, has been shown to be helpful as well.  If you would lie to discuss pharmacotherapy, please  Make an  appt to discuss .  Regards,  Dr. Derrel Nip

## 2020-10-24 DIAGNOSIS — H6123 Impacted cerumen, bilateral: Secondary | ICD-10-CM | POA: Diagnosis not present

## 2020-10-24 DIAGNOSIS — H903 Sensorineural hearing loss, bilateral: Secondary | ICD-10-CM | POA: Diagnosis not present

## 2020-11-15 ENCOUNTER — Telehealth: Payer: Self-pay | Admitting: Cardiovascular Disease

## 2020-11-15 NOTE — Telephone Encounter (Signed)
Patient has been contacted at least 3 times for a recall, recall has been removed

## 2021-01-01 ENCOUNTER — Other Ambulatory Visit: Payer: Self-pay

## 2021-01-01 MED FILL — Zolpidem Tartrate Tab ER 12.5 MG: ORAL | 30 days supply | Qty: 30 | Fill #0 | Status: AC

## 2021-01-02 ENCOUNTER — Other Ambulatory Visit: Payer: Self-pay

## 2021-01-10 DIAGNOSIS — G43109 Migraine with aura, not intractable, without status migrainosus: Secondary | ICD-10-CM | POA: Diagnosis not present

## 2021-01-27 ENCOUNTER — Other Ambulatory Visit: Payer: Self-pay | Admitting: Internal Medicine

## 2021-01-29 ENCOUNTER — Other Ambulatory Visit: Payer: Self-pay

## 2021-01-29 MED ORDER — ZOLPIDEM TARTRATE ER 12.5 MG PO TBCR
12.5000 mg | EXTENDED_RELEASE_TABLET | Freq: Every evening | ORAL | 0 refills | Status: DC | PRN
Start: 2021-01-29 — End: 2021-02-28
  Filled 2021-01-29: qty 30, 30d supply, fill #0

## 2021-01-29 NOTE — Telephone Encounter (Signed)
RX Refill:ambien  Last Seen:07-13-20 Last ordered:08-14-20

## 2021-01-30 ENCOUNTER — Other Ambulatory Visit: Payer: Self-pay

## 2021-02-26 ENCOUNTER — Other Ambulatory Visit: Payer: Self-pay | Admitting: Internal Medicine

## 2021-02-27 ENCOUNTER — Other Ambulatory Visit: Payer: Self-pay

## 2021-02-27 ENCOUNTER — Other Ambulatory Visit: Payer: Self-pay | Admitting: Internal Medicine

## 2021-02-27 NOTE — Telephone Encounter (Signed)
Last OV 10/21 patient has appt scheduled 03/16/21 last filled 01/29/2021 for 30 no refills.

## 2021-02-28 ENCOUNTER — Other Ambulatory Visit: Payer: Self-pay

## 2021-02-28 MED FILL — Zolpidem Tartrate Tab ER 12.5 MG: ORAL | 30 days supply | Qty: 30 | Fill #0 | Status: AC

## 2021-02-28 NOTE — Telephone Encounter (Signed)
Patient called about the status of her zolpidem (AMBIEN CR) 12.5 MG CR tablet.

## 2021-03-01 ENCOUNTER — Other Ambulatory Visit: Payer: Self-pay

## 2021-03-09 ENCOUNTER — Other Ambulatory Visit: Payer: Self-pay

## 2021-03-09 ENCOUNTER — Ambulatory Visit: Payer: 59 | Attending: Internal Medicine

## 2021-03-09 DIAGNOSIS — Z23 Encounter for immunization: Secondary | ICD-10-CM

## 2021-03-09 MED ORDER — PFIZER-BIONT COVID-19 VAC-TRIS 30 MCG/0.3ML IM SUSP
INTRAMUSCULAR | 0 refills | Status: DC
Start: 2021-03-09 — End: 2021-03-16
  Filled 2021-03-09: qty 0.3, 15d supply, fill #0

## 2021-03-09 NOTE — Progress Notes (Signed)
   Covid-19 Vaccination Clinic  Name:  Jean Brooks    MRN: [id number] DOB: [birth year]  03/09/2021  [name] was observed post Covid-19 immunization for 15 minutes without incident. She was provided with Vaccine Information Sheet and instruction to access the V-Safe system.   [name] was instructed to call 911 with any severe reactions post vaccine: Difficulty breathing  Swelling of face and throat  A fast heartbeat  A bad rash all over body  Dizziness and weakness   Immunizations Administered     Name Date Dose VIS Date Route   PFIZER Comrnaty(Gray TOP) Covid-19 Vaccine 03/09/2021  2:53 PM 0.3 mL 09/07/2020 Intramuscular   Manufacturer: [place]   Lot: [id number]   NDC: [place], PharmD, MBA Clinical Acute Care Pharmacist

## 2021-03-16 ENCOUNTER — Encounter: Payer: Self-pay | Admitting: Internal Medicine

## 2021-03-16 ENCOUNTER — Other Ambulatory Visit: Payer: Self-pay

## 2021-03-16 ENCOUNTER — Ambulatory Visit: Payer: 59 | Admitting: Internal Medicine

## 2021-03-16 VITALS — BP 110/64 | HR 78 | Temp 97.0°F | Resp 14 | Ht 63.0 in | Wt 110.8 lb

## 2021-03-16 DIAGNOSIS — M8580 Other specified disorders of bone density and structure, unspecified site: Secondary | ICD-10-CM | POA: Diagnosis not present

## 2021-03-16 DIAGNOSIS — E559 Vitamin D deficiency, unspecified: Secondary | ICD-10-CM

## 2021-03-16 DIAGNOSIS — I700 Atherosclerosis of aorta: Secondary | ICD-10-CM

## 2021-03-16 DIAGNOSIS — E782 Mixed hyperlipidemia: Secondary | ICD-10-CM | POA: Diagnosis not present

## 2021-03-16 MED ORDER — ATORVASTATIN CALCIUM 20 MG PO TABS
ORAL_TABLET | Freq: Every day | ORAL | 0 refills | Status: DC
  Filled 2021-03-16: qty 90, 90d supply, fill #0

## 2021-03-16 NOTE — Progress Notes (Deleted)
9

## 2021-03-16 NOTE — Patient Instructions (Addendum)
Try drinking once ounce of Pickle juice (dill)  ounce daily for muscle cramps  Everything has been refilled.   Try taking the mirtazipine EARLIER (1-2 hours before bedtime)

## 2021-03-17 ENCOUNTER — Other Ambulatory Visit: Payer: Self-pay

## 2021-03-17 LAB — COMPREHENSIVE METABOLIC PANEL
AG Ratio: 1.7 (calc) (ref 1.0–2.5)
ALT: 16 U/L (ref 6–29)
AST: 17 U/L (ref 10–35)
Albumin: 4.4 g/dL (ref 3.6–5.1)
Alkaline phosphatase (APISO): 65 U/L (ref 37–153)
BUN: 15 mg/dL (ref 7–25)
CO2: 26 mmol/L (ref 20–32)
Calcium: 9.7 mg/dL (ref 8.6–10.4)
Chloride: 102 mmol/L (ref 98–110)
Creat: 0.8 mg/dL (ref 0.60–0.93)
Globulin: 2.6 g/dL (calc) (ref 1.9–3.7)
Glucose, Bld: 113 mg/dL — ABNORMAL HIGH (ref 65–99)
Potassium: 4 mmol/L (ref 3.5–5.3)
Sodium: 138 mmol/L (ref 135–146)
Total Bilirubin: 0.5 mg/dL (ref 0.2–1.2)
Total Protein: 7 g/dL (ref 6.1–8.1)

## 2021-03-17 LAB — LIPID PANEL
Cholesterol: 202 mg/dL — ABNORMAL HIGH (ref <200)
HDL: 70 mg/dL (ref >=50)
LDL Cholesterol (Calc): 116 mg/dL (calc) — ABNORMAL HIGH
Non-HDL Cholesterol (Calc): 132 mg/dL (calc) — ABNORMAL HIGH (ref <130)
Total CHOL/HDL Ratio: 2.9 (calc) (ref <5.0)
Triglycerides: 64 mg/dL (ref <150)

## 2021-03-17 LAB — VITAMIN D 25 HYDROXY (VIT D DEFICIENCY, FRACTURES): Vit D, 25-Hydroxy: 44 ng/mL (ref 30–100)

## 2021-03-18 NOTE — Assessment & Plan Note (Signed)
Reviewed Dec 2021 DEXA with patient.    Her ten year risk of hip fracture and 10 year risk of any fracture is well below the recommended treatment threshhold , so no medications are advised at this time.  Continue  goal calcium intake of 1200 mg daily through diet and supplements and 1000 Ius of Vitamin D3 daily unless otherwise recommended. Recommend that we repeat the DEXA in 3 years.  

## 2021-03-18 NOTE — Progress Notes (Signed)
Subjective:  Patient ID: Jean Brooks, female    DOB: Nov 08, 1945  Age: 75 y.o. MRN: [id number]  CC: The primary encounter diagnosis was Vitamin D deficiency. Diagnoses of Mixed hyperlipidemia, Abdominal aortic atherosclerosis ([place]), and Osteopenia, unspecified location were also pertinent to this visit.  HPI:  Jean Brooks presents for follow up on chronic insomnia an other issues  This visit occurred during the SARS-CoV-2 public health emergency.  Safety protocols were in place, including screening questions prior to the visit, additional usage of staff PPE, and extensive cleaning of exam room while observing appropriate contact time as indicated for disinfecting solutions.   Her insomnia is chronic, with no improvement using over-the-counter first generation antihistamines, melatonin,  and nonpharmcologic behavioral tools.  . Reviewed principles of good sleep hygiene. Although she is a snorer, there is no report of apneic spells by husband. She is sleeping more soundly with the addition of remeron to [employer] which had become neffective at maximal dose. Marland Kitchen   Hyperlipidemia:  taking atorvastatin  for  management of aortic atherosclerosis on prior imaging study   Outpatient Medications Prior to Visit  Medication Sig Dispense Refill   Cholecalciferol (VITAMIN D3) 1000 UNITS CAPS Take 1 capsule by mouth every other day. Alternate with the magnesium for leg pain     sodium chloride (MURO 128) 5 % ophthalmic ointment Place 1 application into the right eye at bedtime.     zolpidem (AMBIEN CR) 12.5 MG CR tablet Take 1 tablet (12.5 mg total) by mouth at bedtime as needed. 30 tablet 0   atorvastatin (LIPITOR) 20 MG tablet TAKE 1 TABLET BY MOUTH DAILY 90 tablet 0   cephALEXin (KEFLEX) 500 MG capsule TAKE 1 CAPSULE (500 MG TOTAL) BY MOUTH 2 (TWO) TIMES DAILY FOR 5 DAYS (Patient not taking: Reported on 03/16/2021) 10 capsule 0   COVID-19 mRNA Vac-TriS, Pfizer, (PFIZER-BIONT COVID-19 VAC-TRIS)  SUSP injection Inject into the muscle. (Patient not taking: Reported on 03/16/2021) 0.3 mL 0   ergocalciferol (DRISDOL) 1.25 MG (50000 UT) capsule Take 1 capsule (50,000 Units total) by mouth once a week. (Patient not taking: Reported on 03/16/2021) 4 capsule 3   mirtazapine (REMERON) 15 MG tablet TAKE 1/2 TO 1 TABLET BY MOUTH DAILY 30 MINUTES BEFORE BEDTIME 30 tablet 5   ondansetron (ZOFRAN) 4 MG tablet Take 1 tablet (4 mg total) by mouth every 8 (eight) hours as needed for nausea or vomiting. (Patient not taking: Reported on 03/16/2021) 20 tablet 0   Vitamin D, Ergocalciferol, (DRISDOL) 1.25 MG (50000 UNIT) CAPS capsule TAKE 1 CAPSULE (50,000 UNITS TOTAL) BY MOUTH ONCE A WEEK. (Patient not taking: Reported on 03/16/2021) 4 capsule 3   No facility-administered medications prior to visit.    Review of Systems;  Patient denies headache, fevers, malaise, unintentional weight loss, skin rash, eye pain, sinus congestion and sinus pain, sore throat, dysphagia,  hemoptysis , cough, dyspnea, wheezing, chest pain, palpitations, orthopnea, edema, abdominal pain, nausea, melena, diarrhea, constipation, flank pain, dysuria, hematuria, urinary  Frequency, nocturia, numbness, tingling, seizures,  Focal weakness, Loss of consciousness,  Tremor, insomnia, depression, anxiety, and suicidal ideation.      Objective:  BP 110/64 (BP Location: Left Arm, Patient Position: Sitting, Cuff Size: Normal)   Pulse 78   Temp (!) 97 F (36.1 C) (Temporal)   Resp 14   Ht 5\' 3"  (1.6 m)   Wt 110 lb 12.8 oz (50.3 kg)   SpO2 97%   BMI 19.63 kg/m   BP  Readings from Last 3 Encounters:  03/16/21 110/64  07/13/20 122/68  03/06/20 (!) 148/70    Wt Readings from Last 3 Encounters:  03/16/21 110 lb 12.8 oz (50.3 kg)  07/13/20 109 lb 9.6 oz (49.7 kg)  03/06/20 118 lb (53.5 kg)    General appearance: alert, cooperative and appears stated age Ears: normal TM's and external ear canals both ears Throat: lips, mucosa, and  tongue normal; teeth and gums normal Neck: no adenopathy, no carotid bruit, supple, symmetrical, trachea midline and thyroid not enlarged, symmetric, no tenderness/mass/nodules Back: symmetric, no curvature. ROM normal. No CVA tenderness. Lungs: clear to auscultation bilaterally Heart: regular rate and rhythm, S1, S2 normal, no murmur, click, rub or gallop Abdomen: soft, non-tender; bowel sounds normal; no masses,  no organomegaly Pulses: 2+ and symmetric Skin: Skin color, texture, turgor normal. No rashes or lesions Lymph nodes: Cervical, supraclavicular, and axillary nodes normal.  Lab Results  Component Value Date   HGBA1C 5.7 05/21/2016    Lab Results  Component Value Date   CREATININE 0.80 03/16/2021   CREATININE 0.86 08/08/2020   CREATININE 0.77 07/13/2020    Lab Results  Component Value Date   WBC 4.2 07/13/2020   HGB 14.0 07/13/2020   HCT 41.6 07/13/2020   PLT 239.0 07/13/2020   GLUCOSE 113 (H) 03/16/2021   CHOL 202 (H) 03/16/2021   TRIG 64 03/16/2021   HDL 70 03/16/2021   LDLCALC 116 (H) 03/16/2021   ALT 16 03/16/2021   AST 17 03/16/2021   NA 138 03/16/2021   K 4.0 03/16/2021   CL 102 03/16/2021   CREATININE 0.80 03/16/2021   BUN 15 03/16/2021   CO2 26 03/16/2021   TSH 1.54 07/13/2020   HGBA1C 5.7 05/21/2016    DG Bone Density  Result Date: 09/06/2020 EXAM: DUAL X-RAY ABSORPTIOMETRY (DXA) FOR BONE MINERAL DENSITY IMPRESSION: Your patient Jean Brooks completed a BMD test on 09/06/2020 using the [employer] (software version: 14.10) manufactured by [employer]. The following summarizes the results of our evaluation. Technologist: SCE PATIENT BIOGRAPHICAL: Name: Jean, Brooks Patient ID: [id number] Birth Date: Jun 15, 1946 Height: 63.0 in. Gender: Female Exam Date: 09/06/2020 Weight: 109.6 lbs. Indications: Advanced Age, Parent Hip Fracture, Postmenopausal, Rheumatoid Arthritis Fractures: Treatments: Multi-Vitamin, Vitamin D DENSITOMETRY  RESULTS: Site         Region      Measured Date Measured Age WHO Classification Young Adult T-score BMD         %Change vs. Previous Significant Change (*) DualFemur Total Right 09/06/2020 73.2 Osteopenia -2.0 0.751 g/cm2 -10.1% Yes DualFemur Total Right 12/19/2010 63.4 Osteopenia -1.4 0.835 g/cm2 - - DualFemur Total Mean 09/06/2020 73.2 Osteopenia -2.0 0.761 g/cm2 -9.8% Yes DualFemur Total Mean 12/19/2010 63.4 Osteopenia -1.3 0.844 g/cm2 - - Left Forearm Radius 33% 09/06/2020 73.2 Osteoporosis -3.5 0.569 g/cm2 -27.6% Yes Left Forearm Radius 33% 12/19/2010 63.4 Normal -1.0 0.786 g/cm2 - - ASSESSMENT: The BMD measured at Forearm Radius 33% is 0.569 g/cm2 with a T-score of -3.5. This patient is considered osteoporotic according to [place]) criteria. Compared with prior study, there has been a significant increase in the total hip. Lumbar spine was not utilized due to advanced degenerative changes. World Health Organization Advanced Pain Management) criteria for post-menopausal, Caucasian Women: Normal:                   T-score at or above -1 SD Osteopenia/low bone mass: T-score between -1 and -2.5 SD Osteoporosis:  T-score at or below -2.5 SD RECOMMENDATIONS: 1. All patients should optimize calcium and vitamin D intake. 2. Consider FDA-approved medical therapies in postmenopausal women and men aged 76 years and older, based on the following: a. A hip or vertebral(clinical or morphometric) fracture b. T-score < -2.5 at the femoral neck or spine after appropriate evaluation to exclude secondary causes c. Low bone mass (T-score between -1.0 and -2.5 at the femoral neck or spine) and a 10-year probability of a hip fracture > 3% or a 10-year probability of a major osteoporosis-related fracture > 20% based on the [place]-adapted WHO algorithm 3. Clinician judgment and/or patient preferences may indicate treatment for people with 10-year fracture probabilities above or below these levels FOLLOW-UP: People with  diagnosed cases of osteoporosis or at high risk for fracture should have regular bone mineral density tests. For patients eligible for Medicare, routine testing is allowed once every 2 years. The testing frequency can be increased to one year for patients who have rapidly progressing disease, those who are receiving or discontinuing medical therapy to restore bone mass, or have additional risk factors. I have reviewed this report, and agree with the above findings. [facility] Electronically Signed   By: Rolm Baptise M.D.   On: 09/06/2020 17:01   MM 3D SCREEN BREAST BILATERAL  Result Date: 09/08/2020 CLINICAL DATA:  Screening. EXAM: DIGITAL SCREENING BILATERAL MAMMOGRAM WITH TOMO AND CAD COMPARISON:  Previous exam(s). ACR Breast Density Category b: There are scattered areas of fibroglandular density. FINDINGS: There are no findings suspicious for malignancy. Images were processed with CAD. IMPRESSION: No mammographic evidence of malignancy. A result letter of this screening mammogram will be mailed directly to the patient. RECOMMENDATION: Screening mammogram in one year. (Code:SM-B-01Y) BI-RADS CATEGORY  1: Negative. Electronically Signed   By: Claudie Revering M.D.   On: 09/08/2020 16:59    Assessment & Plan:   Problem List Items Addressed This Visit       Unprioritized   Abdominal aortic atherosclerosis ([place])    Reviewed findings of prior CT scan today..  Patient is tolerating high potency statin therapy        Relevant Medications   atorvastatin (LIPITOR) 20 MG tablet   Osteopenia    Reviewed Dec 2021 DEXA with patient.    Her ten year risk of hip fracture and 10 year risk of any fracture is well below the recommended treatment threshhold , so no medications are advised at this time.  Continue  goal calcium intake of 1200 mg daily through diet and supplements and 1000 Ius of Vitamin D3 daily unless otherwise recommended. Recommend that we repeat the DEXA in 3 years.         Other Visit Diagnoses     Vitamin D deficiency    -  Primary   Relevant Orders   VITAMIN D 25 Hydroxy (Vit-D Deficiency, Fractures) (Completed)   Mixed hyperlipidemia       Relevant Medications   atorvastatin (LIPITOR) 20 MG tablet   Other Relevant Orders   Lipid panel (Completed)   Comprehensive metabolic panel (Completed)       I have discontinued Mardene Celeste C. Collet "[name]"'s ondansetron, ergocalciferol, cephALEXin, Vitamin D (Ergocalciferol), mirtazapine, and Pfizer-BioNT COVID-19 Vac-TriS. I am also having her maintain her Vitamin D3, sodium chloride, zolpidem, and atorvastatin.  Meds ordered this encounter  Medications   atorvastatin (LIPITOR) 20 MG tablet    Sig: TAKE 1 TABLET BY MOUTH DAILY    Dispense:  90 tablet  Refill:  0    Medications Discontinued During This Encounter  Medication Reason   cephALEXin (KEFLEX) 500 MG capsule Completed Course   COVID-19 mRNA Vac-TriS, Pfizer, (PFIZER-BIONT COVID-19 VAC-TRIS) SUSP injection Completed Course   ergocalciferol (DRISDOL) 1.25 MG (50000 UT) capsule Completed Course   mirtazapine (REMERON) 15 MG tablet Completed Course   ondansetron (ZOFRAN) 4 MG tablet    Vitamin D, Ergocalciferol, (DRISDOL) 1.25 MG (50000 UNIT) CAPS capsule Completed Course   atorvastatin (LIPITOR) 20 MG tablet Reorder   I provided  30 minutes of  face-to-face time during this encounter reviewing patient's current management of insomnia, aortic atherosclerosis and osteopenia , providing counseling on the above mentioned problems , and coordination  of care .   Follow-up: No follow-ups on file.   Crecencio Mc, MD

## 2021-03-18 NOTE — Assessment & Plan Note (Signed)
Reviewed findings of prior CT scan today..  Patient is tolerating high potency statin therapy  

## 2021-03-21 ENCOUNTER — Other Ambulatory Visit: Payer: Self-pay

## 2021-03-27 ENCOUNTER — Other Ambulatory Visit: Payer: Self-pay | Admitting: Internal Medicine

## 2021-03-28 ENCOUNTER — Other Ambulatory Visit: Payer: Self-pay

## 2021-03-28 MED ORDER — ZOLPIDEM TARTRATE ER 12.5 MG PO TBCR
12.5000 mg | EXTENDED_RELEASE_TABLET | Freq: Every evening | ORAL | 5 refills | Status: DC | PRN
Start: 2021-03-28 — End: 2021-08-21
  Filled 2021-03-28: qty 30, 30d supply, fill #0
  Filled 2021-04-27: qty 30, 30d supply, fill #1
  Filled 2021-05-24: qty 30, 30d supply, fill #2
  Filled 2021-06-22: qty 30, 30d supply, fill #3
  Filled 2021-07-19 – 2021-07-23 (×2): qty 30, 30d supply, fill #4

## 2021-03-30 ENCOUNTER — Other Ambulatory Visit: Payer: Self-pay

## 2021-04-11 DIAGNOSIS — L821 Other seborrheic keratosis: Secondary | ICD-10-CM | POA: Diagnosis not present

## 2021-04-11 DIAGNOSIS — L578 Other skin changes due to chronic exposure to nonionizing radiation: Secondary | ICD-10-CM | POA: Diagnosis not present

## 2021-04-11 DIAGNOSIS — Q825 Congenital non-neoplastic nevus: Secondary | ICD-10-CM | POA: Diagnosis not present

## 2021-04-12 ENCOUNTER — Other Ambulatory Visit: Payer: Self-pay

## 2021-04-12 MED ORDER — TRETINOIN 0.1 % EX CREA
TOPICAL_CREAM | CUTANEOUS | 0 refills | Status: DC
Start: 2021-04-12 — End: 2022-02-20
  Filled 2021-04-12 – 2021-04-19 (×2): qty 45, 30d supply, fill #0

## 2021-04-19 ENCOUNTER — Other Ambulatory Visit: Payer: Self-pay

## 2021-04-30 ENCOUNTER — Other Ambulatory Visit: Payer: Self-pay

## 2021-05-25 ENCOUNTER — Other Ambulatory Visit: Payer: Self-pay

## 2021-05-28 ENCOUNTER — Other Ambulatory Visit: Payer: Self-pay

## 2021-06-25 ENCOUNTER — Other Ambulatory Visit: Payer: Self-pay

## 2021-07-17 ENCOUNTER — Other Ambulatory Visit: Payer: Self-pay

## 2021-07-17 DIAGNOSIS — H6123 Impacted cerumen, bilateral: Secondary | ICD-10-CM | POA: Diagnosis not present

## 2021-07-17 DIAGNOSIS — H6063 Unspecified chronic otitis externa, bilateral: Secondary | ICD-10-CM | POA: Diagnosis not present

## 2021-07-17 MED ORDER — MOMETASONE FUROATE 0.1 % EX SOLN
CUTANEOUS | 5 refills | Status: DC
  Filled 2021-07-17: qty 60, 30d supply, fill #0

## 2021-07-18 ENCOUNTER — Other Ambulatory Visit: Payer: Self-pay

## 2021-07-19 ENCOUNTER — Other Ambulatory Visit: Payer: Self-pay

## 2021-07-21 ENCOUNTER — Other Ambulatory Visit: Payer: Self-pay

## 2021-07-23 ENCOUNTER — Other Ambulatory Visit: Payer: Self-pay

## 2021-07-25 ENCOUNTER — Encounter: Payer: 59 | Admitting: Internal Medicine

## 2021-07-27 ENCOUNTER — Other Ambulatory Visit: Payer: Self-pay | Admitting: Internal Medicine

## 2021-07-27 DIAGNOSIS — Z1231 Encounter for screening mammogram for malignant neoplasm of breast: Secondary | ICD-10-CM

## 2021-08-21 ENCOUNTER — Other Ambulatory Visit: Payer: Self-pay

## 2021-08-21 ENCOUNTER — Ambulatory Visit (INDEPENDENT_AMBULATORY_CARE_PROVIDER_SITE_OTHER): Payer: 59 | Admitting: Internal Medicine

## 2021-08-21 ENCOUNTER — Encounter: Payer: Self-pay | Admitting: Internal Medicine

## 2021-08-21 VITALS — BP 130/70 | HR 72 | Temp 98.3°F | Ht 63.0 in | Wt 109.2 lb

## 2021-08-21 DIAGNOSIS — Z0000 Encounter for general adult medical examination without abnormal findings: Secondary | ICD-10-CM

## 2021-08-21 DIAGNOSIS — Z789 Other specified health status: Secondary | ICD-10-CM

## 2021-08-21 DIAGNOSIS — E782 Mixed hyperlipidemia: Secondary | ICD-10-CM

## 2021-08-21 DIAGNOSIS — R7301 Impaired fasting glucose: Secondary | ICD-10-CM

## 2021-08-21 DIAGNOSIS — I700 Atherosclerosis of aorta: Secondary | ICD-10-CM | POA: Diagnosis not present

## 2021-08-21 DIAGNOSIS — G4700 Insomnia, unspecified: Secondary | ICD-10-CM

## 2021-08-21 MED ORDER — ZOLPIDEM TARTRATE ER 12.5 MG PO TBCR
12.5000 mg | EXTENDED_RELEASE_TABLET | Freq: Every evening | ORAL | 5 refills | Status: DC | PRN
Start: 2021-08-21 — End: 2022-02-03
  Filled 2021-08-21: qty 30, 30d supply, fill #0
  Filled 2021-09-18: qty 30, 30d supply, fill #1
  Filled 2021-10-14: qty 30, 30d supply, fill #2
  Filled 2021-11-12: qty 30, 30d supply, fill #3
  Filled 2021-12-11: qty 30, 30d supply, fill #4
  Filled 2022-01-06: qty 30, 30d supply, fill #5

## 2021-08-21 NOTE — Patient Instructions (Addendum)
Good to see you!  Thank you for the pound cake!!   If your cholesterol is high we will talk about alternatives  to atorvastatin (like Zetia)

## 2021-08-21 NOTE — Assessment & Plan Note (Signed)
Managed with Ambien CR for years.  The risks and benefits of chronic hyponotic  use were discussed with patient today including excessive sedation leading to respiratory depression,  impaired thinking/driving, and addiction.  Patient was advised to avoid concurrent use with alcohol, to use medication only as needed and not to share with others  .

## 2021-08-21 NOTE — Assessment & Plan Note (Signed)
She did not tolerate atorvastatin due to nausea and "not feeling herself"  In July (after her last lipid panel).  She is willing to try an alternative if indicated

## 2021-08-21 NOTE — Progress Notes (Signed)
Patient ID: Jean Brooks,    DOB: 1945-10-23  Age: 75 y.o. MRN: [id number]  The patient is here for annual preventive  examination and management of other chronic and acute problems.  This visit occurred during the SARS-CoV-2 public health emergency.  Safety protocols were in place, including screening questions prior to the visit, additional usage of staff PPE, and extensive cleaning of exam room while observing appropriate contact time as indicated for disinfecting solutions.   The risk factors are reflected in the social history. 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  Activities of daily living:  The patient is 100% independent in all ADLs: dressing, toileting, feeding as well as independent mobility  Home safety : The patient has smoke detectors in the home. They wear seatbelts.  There are no firearms at home. There is no violence in the home.   There is no risks for hepatitis, STDs or HIV. There is no   history of blood transfusion. They have no travel history to infectious disease endemic areas of the world.  The patient has seen their dentist in the last six month. They have seen their eye doctor in the last year. They admit to slight hearing difficulty with regard to whispered voices and some television programs.  They have deferred audiologic testing in the last year.  They do not  have excessive sun exposure. Discussed the need for sun protection: hats, long sleeves and use of sunscreen if there is significant sun exposure.   Diet: the importance of a healthy diet is discussed. They do have a healthy diet.  The benefits of regular aerobic exercise were discussed. She walks 4 times per week ,  20 minutes.   Depression screen: there are no signs or vegative symptoms of depression- irritability, change in appetite, anhedonia,  sadness/tearfullness.  Cognitive assessment: the patient manages all their financial and personal affairs and is actively engaged. They could relate day,date,year and events; recalled 2/3 objects at 3 minutes; performed clock-face test normally.  The following portions of the patient's history were reviewed and updated as appropriate: allergies, current medications, past family history, past medical history,  past surgical history, past social history  and problem list.  Visual acuity was not assessed per patient preference since she has regular follow up with her ophthalmologist. Hearing and body mass index were assessed and reviewed.   During the course of the visit the patient was educated and counseled about appropriate screening and preventive services including : fall prevention , diabetes screening, nutrition counseling, colorectal cancer screening, and recommended immunizations.    CC: The primary encounter diagnosis was Encounter for preventive health examination. Diagnoses of Impaired fasting glucose, Hyperlipidemia, mixed, Statin intolerance, Insomnia, persistent, and Abdominal aortic atherosclerosis ([place]) were also pertinent to this visit.  1) did not tolerate atorvastatin due to persistent nausea,  and altered sense of being,  stopped it this summer  2) ocular migraines:  saw Porfilio.    History Jean Brooks has a past medical history of Arthritis, Chest pain, non-cardiac (1443), Complication of anesthesia, Coronary artery disease, Cyst, ovarian (12/29/2011), Diverticulitis of colon with hemorrhage (August 2009), Family history of adverse reaction to anesthesia, H/O syncope, Heart murmur, Migraine headache, PONV (postoperative nausea and vomiting), Scoliosis of thoracic spine, Shingles, and Syncope and collapse.   She has a past surgical history  that includes Appendectomy (1963); Abdominal hysterectomy (1991); Colonoscopy; Esophagogastroduodenoscopy; Tonsillectomy; Laparoscopic bilateral  salpingo oophorectomy (Bilateral, 02/20/2018); and Laparoscopic lysis of adhesions (02/20/2018).   Her family history includes Aneurysm (age of onset: 14) in her father; Breast cancer in her maternal aunt; COPD (age of onset: 25) in her father; COPD (age of onset: 12) in her mother; Heart attack (age of onset: 45) in her maternal grandfather; Hypertension in her mother; Mental illness in her father; Stroke in her maternal grandmother and mother; Stroke (age of onset: 36) in her son; Supraventricular tachycardia in her mother.She reports that she has never smoked. She has never used smokeless tobacco. She reports that she does not drink alcohol and does not use drugs.  Outpatient Medications Prior to Visit  Medication Sig Dispense Refill   Cholecalciferol (VITAMIN D3) 1000 UNITS CAPS Take 1 capsule by mouth every other day. Alternate with the magnesium for leg pain     mometasone (ELOCON) 0.1 % lotion 2-4 gtts each ear qhs prn dry skin and itching 60 mL 5   sodium chloride (MURO 128) 5 % ophthalmic ointment Place 1 application into the right eye at bedtime.     tretinoin (RETIN-A) 0.1 % cream Apply a pea sized amount nightly. 45 g 0   atorvastatin (LIPITOR) 20 MG tablet TAKE 1 TABLET BY MOUTH DAILY 90 tablet 0   zolpidem (AMBIEN CR) 12.5 MG CR tablet Take 1 tablet (12.5 mg total) by mouth at bedtime as needed. 30 tablet 5   No facility-administered medications prior to visit.    Review of Systems  Patient denies headache, fevers, malaise, unintentional weight loss, skin rash, eye pain, sinus congestion and sinus pain, sore throat, dysphagia,  hemoptysis , cough, dyspnea, wheezing, chest pain, palpitations, orthopnea, edema, abdominal pain, nausea, melena, diarrhea, constipation, flank pain, dysuria, hematuria, urinary  Frequency, nocturia, numbness, tingling, seizures,  Focal weakness, Loss of consciousness,  Tremor, insomnia, depression, anxiety, and suicidal ideation.     Objective:  BP 130/70    Pulse 72   Temp 98.3 F (36.8 C) (Oral)   Ht 5\' 3"  (1.6 m)   Wt 109 lb 3.2 oz (49.5 kg)   SpO2 98%   BMI 19.34 kg/m   Physical Exam  General appearance: alert, cooperative and appears stated age Head: Normocephalic, without obvious abnormality, atraumatic Eyes: conjunctivae/corneas clear. PERRL, EOM's intact. Fundi benign. Ears: normal TM's and external ear canals both ears Nose: Nares normal. Septum midline. Mucosa normal. No drainage or sinus tenderness. Throat: lips, mucosa, and tongue normal; teeth and gums normal Neck: no adenopathy, no carotid bruit, no JVD, supple, symmetrical, trachea midline and thyroid not enlarged, symmetric, no tenderness/mass/nodules Lungs: clear to auscultation bilaterally Breasts: normal appearance, no masses or tenderness Heart: regular rate and rhythm, S1, S2 normal, no murmur, click, rub or gallop Abdomen: soft, non-tender; bowel sounds normal; no masses,  no organomegaly Extremities: extremities normal, atraumatic, no cyanosis or edema Pulses: 2+ and symmetric Skin: Skin color, texture, turgor normal. No rashes or lesions Neurologic: Alert and oriented X 3, normal strength and tone. Normal symmetric reflexes. Normal coordination and gait.     Assessment & Plan:   Problem List Items Addressed This Visit     Insomnia, persistent    Managed with Ambien CR for years.  The risks and benefits of chronic hyponotic  use were discussed with patient today including excessive sedation leading to respiratory depression,  impaired thinking/driving, and addiction.  Patient was advised to avoid concurrent use with alcohol, to  use medication only as needed and not to share with others  .       Encounter for preventive health examination - Primary    age appropriate education and counseling updated, referrals for preventative services and immunizations addressed, dietary and smoking counseling addressed, most recent labs reviewed.  I have personally reviewed  and have noted:   1) the patient's medical and social history 2) The pt's use of alcohol, tobacco, and illicit drugs 3) The patient's current medications and supplements 4) Functional ability including ADL's, fall risk, home safety risk, hearing and visual impairment 5) Diet and physical activities 6) Evidence for depression or mood disorder 7) The patient's height, weight, and BMI have been recorded in the chart   I have made referrals, and provided counseling and education based on review of the above      Hyperlipidemia, mixed   Relevant Orders   Lipid panel   Abdominal aortic atherosclerosis ([place])    Currently untreated due to statin intolerance      Statin intolerance    She did not tolerate atorvastatin due to nausea and "not feeling herself"  In July (after her last lipid panel).  She is willing to try an alternative if indicated      Other Visit Diagnoses     Impaired fasting glucose       Relevant Orders   Comprehensive metabolic panel   Hemoglobin A1c       I have discontinued Jean Brooks "[name]"'s atorvastatin. I am also having her maintain her Vitamin D3, sodium chloride, tretinoin, mometasone, and zolpidem.  Meds ordered this encounter  Medications   zolpidem (AMBIEN CR) 12.5 MG CR tablet    Sig: Take 1 tablet (12.5 mg total) by mouth at bedtime as needed.    Dispense:  30 tablet    Refill:  5    Medications Discontinued During This Encounter  Medication Reason   atorvastatin (LIPITOR) 20 MG tablet    zolpidem (AMBIEN CR) 12.5 MG CR tablet Reorder    Follow-up: No follow-ups on file.   Crecencio Mc, MD

## 2021-08-21 NOTE — Assessment & Plan Note (Signed)
Currently untreated due to statin intolerance

## 2021-08-21 NOTE — Assessment & Plan Note (Signed)

## 2021-08-22 LAB — COMPREHENSIVE METABOLIC PANEL
ALT: 12 U/L (ref 0–35)
AST: 18 U/L (ref 0–37)
Albumin: 4.4 g/dL (ref 3.5–5.2)
Alkaline Phosphatase: 69 U/L (ref 39–117)
BUN: 19 mg/dL (ref 6–23)
CO2: 28 mEq/L (ref 19–32)
Calcium: 9.2 mg/dL (ref 8.4–10.5)
Chloride: 103 mEq/L (ref 96–112)
Creatinine, Ser: 0.72 mg/dL (ref 0.40–1.20)
GFR: 82.51 mL/min (ref >60.00)
Glucose, Bld: 88 mg/dL (ref 70–99)
Potassium: 3.9 mEq/L (ref 3.5–5.1)
Sodium: 138 mEq/L (ref 135–145)
Total Bilirubin: 0.7 mg/dL (ref 0.2–1.2)
Total Protein: 7.1 g/dL (ref 6.0–8.3)

## 2021-08-22 LAB — LIPID PANEL
Cholesterol: 210 mg/dL — ABNORMAL HIGH (ref 0–200)
HDL: 65.5 mg/dL (ref >39.00)
LDL Cholesterol: 133 mg/dL — ABNORMAL HIGH (ref 0–99)
NonHDL: 144.24
Total CHOL/HDL Ratio: 3
Triglycerides: 58 mg/dL (ref 0.0–149.0)
VLDL: 11.6 mg/dL (ref 0.0–40.0)

## 2021-08-22 LAB — HEMOGLOBIN A1C: Hgb A1c MFr Bld: 6 % (ref 4.6–6.5)

## 2021-08-25 ENCOUNTER — Other Ambulatory Visit: Payer: Self-pay | Admitting: Internal Medicine

## 2021-08-25 MED ORDER — ROSUVASTATIN CALCIUM 20 MG PO TABS
20.0000 mg | ORAL_TABLET | ORAL | 0 refills | Status: DC
  Filled 2021-08-25: qty 25, 87d supply, fill #0

## 2021-08-27 ENCOUNTER — Other Ambulatory Visit: Payer: Self-pay

## 2021-09-07 ENCOUNTER — Other Ambulatory Visit: Payer: Self-pay

## 2021-09-07 ENCOUNTER — Ambulatory Visit
Admission: RE | Admit: 2021-09-07 | Discharge: 2021-09-07 | Disposition: A | Discharge status: Home / Self Care | Payer: 59 | Source: Ambulatory Visit | Attending: Internal Medicine | Admitting: Internal Medicine

## 2021-09-07 DIAGNOSIS — Z1231 Encounter for screening mammogram for malignant neoplasm of breast: Secondary | ICD-10-CM | POA: Diagnosis not present

## 2021-09-11 ENCOUNTER — Other Ambulatory Visit: Payer: Self-pay

## 2021-09-13 ENCOUNTER — Other Ambulatory Visit: Payer: Self-pay

## 2021-09-13 ENCOUNTER — Other Ambulatory Visit: Payer: Self-pay | Admitting: Internal Medicine

## 2021-09-13 DIAGNOSIS — R634 Abnormal weight loss: Secondary | ICD-10-CM

## 2021-09-13 MED FILL — Mirtazapine Tab 15 MG: ORAL | 30 days supply | Qty: 30 | Fill #0 | Status: AC

## 2021-09-14 ENCOUNTER — Other Ambulatory Visit: Payer: Self-pay

## 2021-09-17 ENCOUNTER — Other Ambulatory Visit: Payer: Self-pay

## 2021-09-18 ENCOUNTER — Other Ambulatory Visit: Payer: Self-pay

## 2021-10-15 ENCOUNTER — Other Ambulatory Visit: Payer: Self-pay

## 2021-11-12 MED FILL — Mirtazapine Tab 15 MG: ORAL | 30 days supply | Qty: 30 | Fill #1 | Status: AC

## 2021-11-13 ENCOUNTER — Other Ambulatory Visit: Payer: Self-pay

## 2021-11-14 ENCOUNTER — Other Ambulatory Visit: Payer: Self-pay

## 2021-12-06 ENCOUNTER — Other Ambulatory Visit: Payer: Self-pay

## 2021-12-11 ENCOUNTER — Other Ambulatory Visit: Payer: Self-pay

## 2022-01-07 ENCOUNTER — Other Ambulatory Visit: Payer: Self-pay

## 2022-01-17 ENCOUNTER — Encounter: Payer: Self-pay | Admitting: Internal Medicine

## 2022-01-17 ENCOUNTER — Ambulatory Visit (INDEPENDENT_AMBULATORY_CARE_PROVIDER_SITE_OTHER): Payer: 59 | Admitting: Internal Medicine

## 2022-01-17 ENCOUNTER — Other Ambulatory Visit: Payer: Self-pay

## 2022-01-17 DIAGNOSIS — B3731 Acute candidiasis of vulva and vagina: Secondary | ICD-10-CM

## 2022-01-17 DIAGNOSIS — J0110 Acute frontal sinusitis, unspecified: Secondary | ICD-10-CM

## 2022-01-17 DIAGNOSIS — R110 Nausea: Secondary | ICD-10-CM | POA: Diagnosis not present

## 2022-01-17 MED ORDER — FLUTICASONE PROPIONATE 50 MCG/ACT NA SUSP
2.0000 | Freq: Every day | NASAL | 6 refills | Status: DC | PRN
  Filled 2022-01-17: qty 16, 30d supply, fill #0

## 2022-01-17 MED ORDER — FLUCONAZOLE 150 MG PO TABS
150.0000 mg | ORAL_TABLET | Freq: Once | ORAL | 0 refills | Status: AC
  Filled 2022-01-17: qty 1, 1d supply, fill #0

## 2022-01-17 MED ORDER — ONDANSETRON HCL 4 MG PO TABS
4.0000 mg | ORAL_TABLET | Freq: Three times a day (TID) | ORAL | 0 refills | Status: DC | PRN
  Filled 2022-01-17: qty 40, 14d supply, fill #0

## 2022-01-17 MED ORDER — DOXYCYCLINE HYCLATE 100 MG PO TABS
100.0000 mg | ORAL_TABLET | Freq: Two times a day (BID) | ORAL | 0 refills | Status: DC
  Filled 2022-01-17: qty 20, 10d supply, fill #0

## 2022-01-17 MED ORDER — SALINE SPRAY 0.65 % NA SOLN
2.0000 | NASAL | 2 refills | Status: DC | PRN
  Filled 2022-01-17: qty 30, fill #0

## 2022-01-17 NOTE — Progress Notes (Signed)
Telephone Note ? ?I connected with Wannetta Sender Bettendorf ? on 01/17/22 at  4:00 PM EDT by telephone and verified that I am speaking with the correct person using two identifiers. ? Location patient: [place] ?Location provider:work or home office ?Persons participating in the virtual visit: patient, provider ? ?I discussed the limitations and requested verbal permission for telemedicine visit. The patient expressed understanding and agreed to proceed. ? ? ?HPI: ? ?Acute telemedicine visit for : ?Nasal congestion and sore sinuses with runny nose and sneezing and where her glasses fit it is swollen and her eyes are puffy 2 covid tests since Monday negative. Tried claritin w/o help  ?No fever  ?Works at the hospital  ? ?-Pertinent past medical history: see below ?-Pertinent medication allergies: ?Allergies  ?Allergen Reactions  ? Sulfa Antibiotics Hives  ? Flagyl [Metronidazole] Other (See Comments)  ?  Can't remember but remembers it was not a good experience  ? Aspirin Rash  ? Azithromycin Rash  ?  Within the hour  ? Ciprofloxacin Nausea Only and Rash  ?  After 3 day  ? Erythromycin Rash  ?  Confirmed Feb 2019  ? Ibuprofen Rash  ? Nitrofurantoin Rash  ? Penicillins Rash  ?  Has patient had a PCN reaction causing immediate rash, facial/tongue/throat swelling, SOB or lightheadedness with hypotension: Yes ?Has patient had a PCN reaction causing severe rash involving mucus membranes or skin necrosis: No ?Has patient had a PCN reaction that required hospitalization: No ?Has patient had a PCN reaction occurring within the last 10 years: No ?If all of the above answers are "NO", then may proceed with Cephalosporin use. ?  ? ?-COVID-19 vaccine status:  ?Immunization History  ?Administered Date(s) Administered  ? Influenza Split 06/24/2013  ? Influenza-Unspecified 07/31/2012, 06/30/2014, 07/03/2016, 06/09/2017, 06/22/2018, 06/20/2020, 06/29/2021  ? PFIZER Comirnaty(Gray Top)Covid-19 Tri-Sucrose Vaccine 03/09/2021  ? PFIZER(Purple  Top)SARS-COV-2 Vaccination 09/27/2019, 10/16/2019, 08/12/2020  ? Pneumococcal Conjugate-13 05/31/2014  ? Pneumococcal Polysaccharide-23 06/14/2015  ? Tdap 03/23/2013  ? Zoster Recombinat (Shingrix) 07/19/2019, 12/03/2019  ? Zoster, Live 03/24/2013  ? ? ? ?ROS: See pertinent positives and negatives per HPI. ? ?Past Medical History:  ?Diagnosis Date  ? Arthritis   ? bil hands/thumbs  ? Chest pain, non-cardiac 2010  ? a. stress echo 2010: nl treadmill EKG w/o evidence of ischemia or arrhythmia, good exercise tolerance for age, normal stress echo images w/o evidence of myocardial ischemia; b. baseline echo with EF 55% peak stress showed nl LV systolic function wtih EF 65% w/o evidence of HK  ? Complication of anesthesia   ? Coronary artery disease   ? Cyst, ovarian 12/29/2011  ? Serous cystadenomas, bilateral.  S/pBSO May 2019 ([facility])   ? Diverticulitis of colon with hemorrhage August 2009  ? Hospitalized [facility]  ? Family history of adverse reaction to anesthesia   ? mom and maternal grandmother-agitated  ? H/O syncope   ? a. echo 2010: EF 60%, LV nl size, RV nl size & fxn, atria nl size, aorta nl, no pericardial effusion, aortic valve nl, mitral valve nl w/ mild insufficiency, tricuspid valve nl w/ mild insufficiency, pulmonic valve nl  ? Heart murmur   ? asymptomatic-in pt's 20's  ? Migraine headache   ? h/o migraines  ? PONV (postoperative nausea and vomiting)   ? Scoliosis of thoracic spine   ? Shingles   ? Syncope and collapse   ? ? ?Past Surgical History:  ?Procedure Laterality Date  ? ABDOMINAL HYSTERECTOMY  1991  ? APPENDECTOMY  1963  ? COLONOSCOPY    ? ESOPHAGOGASTRODUODENOSCOPY    ? LAPAROSCOPIC BILATERAL SALPINGO OOPHERECTOMY Bilateral 02/20/2018  ? Procedure: LAPAROSCOPIC BILATERAL SALPINGO OOPHORECTOMY;  Surgeon: Ward, Honor Loh, MD;  Location: [facility] ORS;  Service: Gynecology;  Laterality: Bilateral;  ? LAPAROSCOPIC LYSIS OF ADHESIONS  02/20/2018  ? Procedure: LAPAROSCOPIC LYSIS OF ADHESIONS;  Surgeon: Ward,  Honor Loh, MD;  Location: [facility] ORS;  Service: Gynecology;;  ? TONSILLECTOMY    ? age 2 or 51  ? ? ? ?Current Outpatient Medications:  ?  Cholecalciferol (VITAMIN D3) 1000 UNITS CAPS, Take 1 capsule by mouth every other day. Alternate with the magnesium for leg pain, Disp: , Rfl:  ?  doxycycline (VIBRA-TABS) 100 MG tablet, Take 1 tablet (100 mg total) by mouth 2 (two) times daily. With food x 7-10 days, Disp: 20 tablet, Rfl: 0 ?  fluconazole (DIFLUCAN) 150 MG tablet, Take 1 tablet (150 mg total) by mouth once for 1 dose. Yeast infection, Disp: 1 tablet, Rfl: 0 ?  fluticasone (FLONASE) 50 MCG/ACT nasal spray, Place 2 sprays into both nostrils daily as needed for allergies or rhinitis., Disp: 16 g, Rfl: 6 ?  ondansetron (ZOFRAN) 4 MG tablet, Take 1 tablet (4 mg total) by mouth every 8 (eight) hours as needed., Disp: 40 tablet, Rfl: 0 ?  sodium chloride (MURO 128) 5 % ophthalmic ointment, Place 1 application into the right eye at bedtime., Disp: , Rfl:  ?  sodium chloride (OCEAN) 0.65 % SOLN nasal spray, Place 2 sprays into both nostrils as needed for congestion. Followed by flonase, Disp: 30 mL, Rfl: 2 ?  zolpidem (AMBIEN CR) 12.5 MG CR tablet, Take 1 tablet (12.5 mg total) by mouth at bedtime as needed., Disp: 30 tablet, Rfl: 5 ?  mirtazapine (REMERON) 15 MG tablet, TAKE 1/2 TO 1 TABLET BY MOUTH DAILY 30 MINUTES BEFORE BEDTIME (Patient not taking: Reported on 01/17/2022), Disp: 30 tablet, Rfl: 5 ?  mometasone (ELOCON) 0.1 % lotion, 2-4 gtts each ear qhs prn dry skin and itching (Patient not taking: Reported on 01/17/2022), Disp: 60 mL, Rfl: 5 ?  rosuvastatin (CRESTOR) 20 MG tablet, Take 1 tablet (20 mg total) by mouth 2 (two) times a week. (Patient not taking: Reported on 01/17/2022), Disp: 25 tablet, Rfl: 0 ?  tretinoin (RETIN-A) 0.1 % cream, Apply a pea sized amount nightly. (Patient not taking: Reported on 01/17/2022), Disp: 45 g, Rfl: 0 ? ?EXAM: ? ?VITALS per patient if applicable: ? ?GENERAL: alert, oriented, appears  well and in no acute distress ? ?MS: moves all visible extremities without noticeable abnormality ? ?PSYCH/NEURO: pleasant and cooperative, no obvious depression or anxiety, speech and thought processing grossly intact ? ?ASSESSMENT AND PLAN: ? ?Discussed the following assessment and plan: ? ?Acute non-recurrent frontal sinusitis - Plan: sodium chloride (OCEAN) 0.65 % SOLN nasal spray, fluticasone (FLONASE) 50 MCG/ACT nasal spray, doxycycline (VIBRA-TABS) 100 MG tablet bid x 7-10 days ? ?Yeast vaginitis - Plan: fluconazole (DIFLUCAN) 150 MG tablet ? ?Nausea - Plan: ondansetron (ZOFRAN) 4 MG tablet ? ?-we discussed possible serious and likely etiologies, options for evaluation and workup, limitations of telemedicine visit vs in person visit, treatment, treatment risks and precautions. Pt is agreeable to treatment via telemedicine at this moment.  ? ?  ?I discussed the assessment and treatment plan with the patient. The patient was provided an opportunity to ask questions and all were answered. The patient agreed with the plan and demonstrated an understanding of the instructions. ?  ? ?Time spent  20 minutes  ?Nino Glow McLean-Scocuzza, MD   ?

## 2022-01-23 ENCOUNTER — Other Ambulatory Visit: Payer: Self-pay

## 2022-02-03 ENCOUNTER — Other Ambulatory Visit: Payer: Self-pay | Admitting: Internal Medicine

## 2022-02-04 ENCOUNTER — Other Ambulatory Visit: Payer: Self-pay

## 2022-02-04 NOTE — Telephone Encounter (Signed)
Refilled: 08/21/2021 ?Last OV: 08/21/2021 ?Next OV: 02/20/2022 ?

## 2022-02-05 ENCOUNTER — Other Ambulatory Visit: Payer: Self-pay

## 2022-02-05 MED ORDER — ZOLPIDEM TARTRATE ER 12.5 MG PO TBCR
12.5000 mg | EXTENDED_RELEASE_TABLET | Freq: Every evening | ORAL | 5 refills | Status: DC | PRN
  Filled 2022-02-05: qty 30, 30d supply, fill #0

## 2022-02-19 ENCOUNTER — Other Ambulatory Visit: Payer: Self-pay

## 2022-02-19 ENCOUNTER — Emergency Department: Payer: 59

## 2022-02-19 ENCOUNTER — Inpatient Hospital Stay
Admission: EM | Admit: 2022-02-19 | Discharge: 2022-02-20 | DRG: 392 | Disposition: A | Discharge status: Home / Self Care | Payer: 59 | Attending: Osteopathic Medicine | Admitting: Osteopathic Medicine

## 2022-02-19 DIAGNOSIS — I700 Atherosclerosis of aorta: Secondary | ICD-10-CM | POA: Diagnosis not present

## 2022-02-19 DIAGNOSIS — Z8249 Family history of ischemic heart disease and other diseases of the circulatory system: Secondary | ICD-10-CM

## 2022-02-19 DIAGNOSIS — K5792 Diverticulitis of intestine, part unspecified, without perforation or abscess without bleeding: Secondary | ICD-10-CM

## 2022-02-19 DIAGNOSIS — E782 Mixed hyperlipidemia: Secondary | ICD-10-CM | POA: Diagnosis present

## 2022-02-19 DIAGNOSIS — Z825 Family history of asthma and other chronic lower respiratory diseases: Secondary | ICD-10-CM

## 2022-02-19 DIAGNOSIS — G4700 Insomnia, unspecified: Secondary | ICD-10-CM | POA: Diagnosis present

## 2022-02-19 DIAGNOSIS — Z880 Allergy status to penicillin: Secondary | ICD-10-CM

## 2022-02-19 DIAGNOSIS — D649 Anemia, unspecified: Secondary | ICD-10-CM | POA: Diagnosis not present

## 2022-02-19 DIAGNOSIS — D72829 Elevated white blood cell count, unspecified: Secondary | ICD-10-CM | POA: Diagnosis present

## 2022-02-19 DIAGNOSIS — R1032 Left lower quadrant pain: Secondary | ICD-10-CM

## 2022-02-19 DIAGNOSIS — R109 Unspecified abdominal pain: Secondary | ICD-10-CM | POA: Diagnosis not present

## 2022-02-19 DIAGNOSIS — M419 Scoliosis, unspecified: Secondary | ICD-10-CM | POA: Diagnosis present

## 2022-02-19 DIAGNOSIS — Z823 Family history of stroke: Secondary | ICD-10-CM | POA: Diagnosis not present

## 2022-02-19 DIAGNOSIS — K5720 Diverticulitis of large intestine with perforation and abscess without bleeding: Principal | ICD-10-CM | POA: Diagnosis present

## 2022-02-19 DIAGNOSIS — I2510 Atherosclerotic heart disease of native coronary artery without angina pectoris: Secondary | ICD-10-CM | POA: Diagnosis present

## 2022-02-19 DIAGNOSIS — Z79899 Other long term (current) drug therapy: Secondary | ICD-10-CM | POA: Diagnosis not present

## 2022-02-19 HISTORY — DX: Diverticulitis of intestine, part unspecified, without perforation or abscess without bleeding: K57.92

## 2022-02-19 LAB — COMPREHENSIVE METABOLIC PANEL
ALT: 16 U/L (ref 0–44)
AST: 21 U/L (ref 15–41)
Albumin: 4 g/dL (ref 3.5–5.0)
Alkaline Phosphatase: 67 U/L (ref 38–126)
Anion gap: 13 (ref 5–15)
BUN: 27 mg/dL — ABNORMAL HIGH (ref 8–23)
CO2: 20 mmol/L — ABNORMAL LOW (ref 22–32)
Calcium: 9.5 mg/dL (ref 8.9–10.3)
Chloride: 102 mmol/L (ref 98–111)
Creatinine, Ser: 0.9 mg/dL (ref 0.44–1.00)
GFR, Estimated: >60 mL/min (ref >60)
Glucose, Bld: 90 mg/dL (ref 70–99)
Potassium: 3.8 mmol/L (ref 3.5–5.1)
Sodium: 135 mmol/L (ref 135–145)
Total Bilirubin: 1.4 mg/dL — ABNORMAL HIGH (ref 0.3–1.2)
Total Protein: 7.9 g/dL (ref 6.5–8.1)

## 2022-02-19 LAB — URINALYSIS, ROUTINE W REFLEX MICROSCOPIC
Bacteria, UA: NONE SEEN
Bilirubin Urine: NEGATIVE
Glucose, UA: NEGATIVE mg/dL
Ketones, ur: 20 mg/dL — AB
Nitrite: NEGATIVE
Protein, ur: NEGATIVE mg/dL
Specific Gravity, Urine: >1.046 — ABNORMAL HIGH (ref 1.005–1.030)
pH: 5 (ref 5.0–8.0)

## 2022-02-19 LAB — CBC
HCT: 41 % (ref 36.0–46.0)
Hemoglobin: 13.3 g/dL (ref 12.0–15.0)
MCH: 29 pg (ref 26.0–34.0)
MCHC: 32.4 g/dL (ref 30.0–36.0)
MCV: 89.3 fL (ref 80.0–100.0)
Platelets: 206 10*3/uL (ref 150–400)
RBC: 4.59 MIL/uL (ref 3.87–5.11)
RDW: 12.6 % (ref 11.5–15.5)
WBC: 16.7 10*3/uL — ABNORMAL HIGH (ref 4.0–10.5)
nRBC: 0 % (ref 0.0–0.2)

## 2022-02-19 LAB — LIPASE, BLOOD: Lipase: 34 U/L (ref 11–51)

## 2022-02-19 MED ORDER — ZOLPIDEM TARTRATE 5 MG PO TABS
10.0000 mg | ORAL_TABLET | Freq: Every evening | ORAL | Status: DC | PRN
  Administered 2022-02-19: 10 mg via ORAL
  Filled 2022-02-19: qty 2

## 2022-02-19 MED ORDER — ONDANSETRON HCL 4 MG PO TABS: 4.0000 mg | ORAL_TABLET | Freq: Four times a day (QID) | ORAL | Status: DC | PRN

## 2022-02-19 MED ORDER — METRONIDAZOLE 500 MG/100ML IV SOLN
500.0000 mg | Freq: Once | INTRAVENOUS | Status: AC
  Administered 2022-02-19: 500 mg via INTRAVENOUS
  Filled 2022-02-19: qty 100

## 2022-02-19 MED ORDER — SODIUM CHLORIDE 0.9 % IV SOLN
2.0000 g | INTRAVENOUS | Status: DC
  Filled 2022-02-19: qty 20

## 2022-02-19 MED ORDER — IOHEXOL 300 MG/ML  SOLN
100.0000 mL | Freq: Once | INTRAMUSCULAR | Status: AC | PRN
  Administered 2022-02-19: 100 mL via INTRAVENOUS

## 2022-02-19 MED ORDER — ENOXAPARIN SODIUM 40 MG/0.4ML IJ SOSY
40.0000 mg | PREFILLED_SYRINGE | INTRAMUSCULAR | Status: DC
  Administered 2022-02-19: 40 mg via SUBCUTANEOUS
  Filled 2022-02-19: qty 0.4

## 2022-02-19 MED ORDER — METRONIDAZOLE 500 MG/100ML IV SOLN
500.0000 mg | Freq: Two times a day (BID) | INTRAVENOUS | Status: DC
  Administered 2022-02-19 – 2022-02-20 (×2): 500 mg via INTRAVENOUS
  Filled 2022-02-19 (×2): qty 100

## 2022-02-19 MED ORDER — MORPHINE SULFATE (PF) 4 MG/ML IV SOLN
4.0000 mg | INTRAVENOUS | Status: DC | PRN
  Administered 2022-02-19 – 2022-02-20 (×3): 4 mg via INTRAVENOUS
  Filled 2022-02-19 (×3): qty 1

## 2022-02-19 MED ORDER — LACTATED RINGERS IV SOLN: INTRAVENOUS | Status: DC

## 2022-02-19 MED ORDER — ACETAMINOPHEN 650 MG RE SUPP: 650.0000 mg | Freq: Four times a day (QID) | RECTAL | Status: DC | PRN

## 2022-02-19 MED ORDER — ONDANSETRON HCL 4 MG/2ML IJ SOLN: 4.0000 mg | Freq: Four times a day (QID) | INTRAMUSCULAR | Status: DC | PRN

## 2022-02-19 MED ORDER — ONDANSETRON HCL 4 MG/2ML IJ SOLN
4.0000 mg | Freq: Once | INTRAMUSCULAR | Status: AC
Start: 2022-02-19 — End: 2022-02-19
  Administered 2022-02-19: 4 mg via INTRAVENOUS
  Filled 2022-02-19: qty 2

## 2022-02-19 MED ORDER — ACETAMINOPHEN 325 MG PO TABS: 650.0000 mg | ORAL_TABLET | Freq: Four times a day (QID) | ORAL | Status: DC | PRN

## 2022-02-19 MED ORDER — ZOLPIDEM TARTRATE 5 MG PO TABS: 5.0000 mg | ORAL_TABLET | Freq: Every evening | ORAL | Status: DC | PRN

## 2022-02-19 MED ORDER — SODIUM CHLORIDE 0.9 % IV SOLN
1.0000 g | Freq: Once | INTRAVENOUS | Status: AC
  Administered 2022-02-19: 1 g via INTRAVENOUS
  Filled 2022-02-19: qty 10

## 2022-02-19 MED ORDER — SODIUM CHLORIDE 0.9 % IV BOLUS
1000.0000 mL | Freq: Once | INTRAVENOUS | Status: AC
  Administered 2022-02-19: 1000 mL via INTRAVENOUS

## 2022-02-19 NOTE — H&P (Signed)
History and Physical:    Jean Brooks   WER:[id number] DOB: 1945/12/29 DOA: 02/19/2022  Referring MD/provider: Dr. Quentin Cornwall PCP: Crecencio Mc, MD   Patient coming from: Home  Chief Complaint: Fevers and abdominal pain  History of Present Illness:   Jean Brooks is an 76 y.o. 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  Patient the symptoms are similar to her previous episodes of colitis and/or diverticulitis.  Patient was hoping however that it would improve on its own as her episodes of colitis usually do.  However patient noted pain worsened, had diarrhea "about 1 million times" and she noted she was unable to keep p.o. secondary to dry heaves and vomiting.  Patient discussed symptoms with Dr. Vicente Males who advised her to come to the ED right away.   Patient denies overt fevers but does admit to chills and malaise as noted above.    No hematochezia or hematemesis.  Admits to minimal dizziness.  Admits to fatigue and weakness..  No dysuria urgency or frequency.  No hematemesis.   ED Course:  The patient was noted to be afebrile and normotensive.  Laboratory work-up was notable for leukocytosis at 17 and a normal creatinine at 0.9.  CT scan as noted below showed 3.3 cm collection of fluid consistent with possible contained perforation versus abscess.  Patient was discussed with general surgery Dr. Peyton Najjar who recommended conservative management and possible referral to IR.  They will follow along.  Patient was treated with ceftriaxone and metronidazole and called in for admission.  ROS:   ROS   Review of Systems: As per HPI  Past Medical History:   Past Medical History:  Diagnosis Date   Arthritis    bil hands/thumbs   Chest pain, non-cardiac 2010   a. stress echo 2010: nl treadmill EKG w/o evidence of ischemia or arrhythmia, good exercise  tolerance for age, normal stress echo images w/o evidence of myocardial ischemia; b. baseline echo with EF 55% peak stress showed nl LV systolic function wtih EF 65% w/o evidence of HK   Complication of anesthesia    Coronary artery disease    Cyst, ovarian 12/29/2011   Serous cystadenomas, bilateral.  S/pBSO May 2019 ([facility])    Diverticulitis of colon with hemorrhage August 2009   Hospitalized [facility]   Family history of adverse reaction to anesthesia    mom and maternal grandmother-agitated   H/O syncope    a. echo 2010: EF 60%, LV nl size, RV nl size & fxn, atria nl size, aorta nl, no pericardial effusion, aortic valve nl, mitral valve nl w/ mild insufficiency, tricuspid valve nl w/ mild insufficiency, pulmonic valve nl   Heart murmur    asymptomatic-in pt's 20's   Migraine headache    h/o migraines   PONV (postoperative nausea and vomiting)    Scoliosis of thoracic spine    Shingles    Syncope and collapse     Past Surgical History:   Past Surgical History:  Procedure Laterality Date   ABDOMINAL HYSTERECTOMY  1991   APPENDECTOMY  1963   COLONOSCOPY     ESOPHAGOGASTRODUODENOSCOPY     LAPAROSCOPIC BILATERAL SALPINGO OOPHERECTOMY Bilateral 02/20/2018   Procedure: LAPAROSCOPIC BILATERAL SALPINGO OOPHORECTOMY;  Surgeon: Ward, Honor Loh, MD;  Location: [facility] ORS;  Service: Gynecology;  Laterality: Bilateral;   LAPAROSCOPIC LYSIS OF ADHESIONS  02/20/2018  Procedure: LAPAROSCOPIC LYSIS OF ADHESIONS;  Surgeon: Ward, Honor Loh, MD;  Location: [facility] ORS;  Service: Gynecology;;   TONSILLECTOMY     age 66 or 6    Social History:   Social History   Socioeconomic History   Marital status: Married    Spouse name: Not on file   Number of children: Not on file   Years of education: Not on file   Highest education level: Not on file  Occupational History   Not on file  Tobacco Use   Smoking status: Never   Smokeless tobacco: Never  Vaping Use   Vaping Use: Never used  Substance and  Sexual Activity   Alcohol use: No   Drug use: No   Sexual activity: Not on file  Other Topics Concern   Not on file  Social History Narrative   Not on file   Social Determinants of Health   Financial Resource Strain: Not on file  Food Insecurity: Not on file  Transportation Needs: Not on file  Physical Activity: Not on file  Stress: Not on file  Social Connections: Not on file  Intimate Partner Violence: Not on file    Allergies   Sulfa antibiotics, Flagyl [metronidazole], Aspirin, Azithromycin, Ciprofloxacin, Erythromycin, Ibuprofen, Nitrofurantoin, and Penicillins  Family history:   Family History  Problem Relation Age of Onset   COPD Mother 64       respiratory failure/fibrosis, seizures   Stroke Mother    Hypertension Mother    Supraventricular tachycardia Mother    COPD Father 2   Mental illness Father        Parkinson's Dementia   Aneurysm Father 80   Stroke Son 59       hypertensive, left brain    Heart attack Maternal Grandfather 35       passed   Stroke Maternal Grandmother    Breast cancer Maternal Aunt     Current Medications:   Prior to Admission medications   Medication Sig Start Date End Date Taking? Authorizing Provider  Cholecalciferol (VITAMIN D3) 1000 UNITS CAPS Take 1 capsule by mouth every other day. Alternate with the magnesium for leg pain    [provider]  doxycycline (VIBRA-TABS) 100 MG tablet Take 1 tablet (100 mg total) by mouth 2 (two) times daily. With food x 7-10 days 01/17/22   McLean-Scocuzza, Nino Glow, MD  fluticasone [facility]) 50 MCG/ACT nasal spray Place 2 sprays into both nostrils daily as needed for allergies or rhinitis. 01/17/22   McLean-Scocuzza, Nino Glow, MD  mirtazapine (REMERON) 15 MG tablet TAKE 1/2 TO 1 TABLET BY MOUTH DAILY 30 MINUTES BEFORE BEDTIME Patient not taking: Reported on 01/17/2022 09/13/21   Crecencio Mc, MD  mometasone (ELOCON) 0.1 % lotion 2-4 gtts each ear qhs prn dry skin and itching Patient  not taking: Reported on 01/17/2022 07/17/21     ondansetron (ZOFRAN) 4 MG tablet Take 1 tablet (4 mg total) by mouth every 8 (eight) hours as needed. 01/17/22   McLean-Scocuzza, Nino Glow, MD  rosuvastatin (CRESTOR) 20 MG tablet Take 1 tablet (20 mg total) by mouth 2 (two) times a week. Patient not taking: Reported on 01/17/2022 08/27/21   Crecencio Mc, MD  sodium chloride (MURO 128) 5 % ophthalmic ointment Place 1 application into the right eye at bedtime.    [provider]  sodium chloride (OCEAN) 0.65 % SOLN nasal spray Place 2 sprays into both nostrils as needed for congestion. Followed by flonase 01/17/22  McLean-Scocuzza, Nino Glow, MD  tretinoin (RETIN-A) 0.1 % cream Apply a pea sized amount nightly. Patient not taking: Reported on 01/17/2022 04/12/21     zolpidem (AMBIEN CR) 12.5 MG CR tablet Take 1 tablet (12.5 mg total) by mouth at bedtime as needed. 02/05/22 08/04/22  Crecencio Mc, MD    Physical Exam:   Vitals:   02/19/22 0933 02/19/22 1130 02/19/22 1200 02/19/22 1230  BP: 101/64 (!) 129/57 112/60 (!) 113/58  Pulse: 100 93 83 85  Resp: '18 20 13 19  '$ Temp: 97.9 F (36.6 C)     TempSrc: Oral     SpO2: 96% 97% 100% 98%     Physical Exam: Blood pressure (!) 113/58, pulse 85, temperature 97.9 F (36.6 C), temperature source Oral, resp. rate 19, SpO2 98 %. Gen: Tired but relatively well-appearing patient sitting up in stretcher in NARD. Eyes: sclera anicteric, conjuctiva mildly injected bilaterally CVS: S1-S2, regulary, no gallops Respiratory:  decreased air entry likely secondary to decreased inspiratory effort GI: Normoactive bowel sounds although diminished in number, normal tone.  Abdomen is soft.  She has moderate tenderness to deep palpation in the left lower quadrant/suprapubic region.  She has focal rebound tenderness in LLQ. LE: No edema. No cyanosis Neuro: A/O x 3, Moving all extremities equally with normal strength, CN 3-12 intact, grossly nonfocal.  Psych:  patient is logical and coherent, judgement and insight appear normal, mood and affect appropriate to situation. Skin: no rashes or lesions or ulcers,    Data Review:    Labs: Basic Metabolic Panel: Recent Labs  Lab 02/19/22 0931  NA 135  K 3.8  CL 102  CO2 20*  GLUCOSE 90  BUN 27*  CREATININE 0.90  CALCIUM 9.5   Liver Function Tests: Recent Labs  Lab 02/19/22 0931  AST 21  ALT 16  ALKPHOS 67  BILITOT 1.4*  PROT 7.9  ALBUMIN 4.0   Recent Labs  Lab 02/19/22 0931  LIPASE 34   No results for input(s): AMMONIA in the last 168 hours. CBC: Recent Labs  Lab 02/19/22 0931  WBC 16.7*  HGB 13.3  HCT 41.0  MCV 89.3  PLT 206   Cardiac Enzymes: No results for input(s): CKTOTAL, CKMB, CKMBINDEX, TROPONINI in the last 168 hours.  BNP (last 3 results) No results for input(s): PROBNP in the last 8760 hours. CBG: No results for input(s): GLUCAP in the last 168 hours.  Urinalysis    Component Value Date/Time   COLORURINE YELLOW 07/08/2019 0907   APPEARANCEUR CLEAR 07/08/2019 0907   APPEARANCEUR Clear 08/18/2013 1605   LABSPEC 1.010 07/08/2019 0907   LABSPEC 1.006 08/18/2013 1605   PHURINE 7.0 07/08/2019 0907   GLUCOSEU NEGATIVE 07/08/2019 0907   HGBUR NEGATIVE 07/08/2019 0907   BILIRUBINUR NEGATIVE 07/08/2019 0907   BILIRUBINUR negative 11/14/2016 1150   BILIRUBINUR Negative 08/18/2013 [place] 07/08/2019 0907   PROTEINUR NEGATIVE 12/04/2017 2226   UROBILINOGEN 0.2 07/08/2019 0907   NITRITE NEGATIVE 07/08/2019 0907   LEUKOCYTESUR NEGATIVE 07/08/2019 0907   LEUKOCYTESUR Trace 08/18/2013 1605      Radiographic Studies: CT ABDOMEN PELVIS W CONTRAST  Result Date: 02/19/2022 CLINICAL DATA:  Acute generalized abdominal pain. EXAM: CT ABDOMEN AND PELVIS WITH CONTRAST TECHNIQUE: Multidetector CT imaging of the abdomen and pelvis was performed using the standard protocol following bolus administration of intravenous contrast. RADIATION DOSE  REDUCTION: This exam was performed according to the departmental dose-optimization program which includes automated exposure control, adjustment of the mA and/or  kV according to patient size and/or use of iterative reconstruction technique. CONTRAST:  180m OMNIPAQUE IOHEXOL 300 MG/ML  SOLN COMPARISON:  December 04, 2017. FINDINGS: Lower chest: No acute abnormality. Hepatobiliary: No focal liver abnormality is seen. No gallstones, gallbladder wall thickening, or biliary dilatation. Pancreas: Unremarkable. No pancreatic ductal dilatation or surrounding inflammatory changes. Spleen: Normal in size without focal abnormality. Adrenals/Urinary Tract: Adrenal glands are unremarkable. Kidneys are normal, without renal calculi, focal lesion, or hydronephrosis. Bladder is unremarkable. Stomach/Bowel: Status post appendectomy. Stomach is unremarkable. There is no evidence of bowel obstruction. Proximal sigmoid diverticulitis is noted. 3.3 cm rounded abnormality containing predominantly air and some fluid is noted adjacent to the inflamed segment of colon. It is uncertain if this represents small abscess, enlarged diverticulum or possibly contained perforation. Vascular/Lymphatic: Aortic atherosclerosis. No enlarged abdominal or pelvic lymph nodes. Reproductive: Status post hysterectomy. No adnexal masses. Other: No abdominal wall hernia or abnormality. No abdominopelvic ascites. Musculoskeletal: No acute or significant osseous findings. IMPRESSION: Proximal sigmoid diverticulitis is noted. 3.3 cm rounded abnormality containing predominantly air and some fluid is noted adjacent to the inflamed segment of colon; it is uncertain if this represents small abscess, enlarged diverticulum or possibly contained perforation. Aortic Atherosclerosis (ICD10-I70.0). Electronically Signed   By: JMarijo ConceptionM.D.   On: 02/19/2022 12:00    EKG: Independently reviewed.  Ordered and pending   Assessment/Plan:   Principal Problem:    Diverticulitis of colon with perforation  Diverticulitis Patient discussed with general surgery, Dr. CPeyton Najjarin ED who recommended conservative manage with IV antibiotics, they will follow. CT films reviewed with IR Dr. MMaryelizabeth Kaufmannwho also noted that given the amount of gas and tight window, they would recommend IV antibiotics and follow-up with films in case of clinical deterioration. Continue ceftriaxone and Flagyl--of note, patient is PCN allergic and is noted to have unspecified "bad reaction" that she does not remember to Flagyl.  I will continue Flagyl as initiated in the ED, can consider changing to clindamycin if warranted.  Insomnia Continue zolpidem per home doses   Other information:   DVT prophylaxis: Lovenox ordered. Code Status: Full Family Communication: Patient states she is in contact with her family Disposition Plan: Home Consults called: General surgery, Dr. CPeyton Najjarcalled in the ED.  Discussed with IR with recommendations for conservative management as noted above. Admission status: Inpatient  [facility]  If 7PM-7AM, please contact night-coverage [email or web address]

## 2022-02-19 NOTE — Progress Notes (Signed)
GI note:    H/o 3 days abdominal pain, she works with me in endoscopy , she called me this morning with abdominal pain and when I spoke to her she sounded ill on the phone and I advised her to come to the ER.   CT abdomen shows diverticulitis with ? Contained perforation/Abscess.   She is feeling much better after getting IV antibiotics .   Agree with plan to continue Antibiotics and surgical consult for surgery at a later point since she has had multiple episodes of diverticulitis.   Dr Jonathon Bellows MD,MRCP [facility]) Gastroenterology/Hepatology Pager: [phone]

## 2022-02-19 NOTE — ED Provider Notes (Signed)
[facility] Provider Note    Event Date/Time   First MD Initiated Contact with Patient 02/19/22 1033     (approximate)   History   Abdominal Pain   HPI  Jean Brooks is a 76 y.o. female with a history of colitis and diverticulitis presents to the ER for evaluation of left lower quadrant abdominal pain nonmovement of the right upper quadrant associate with nausea vomiting poor p.o. intake over the past 48 hours.  No measured fevers has had some chills not currently on any antibiotics.  States the pain is mild to moderate currently.     Physical Exam   Triage Vital Signs: ED Triage Vitals  Enc Vitals Group     BP 02/19/22 0933 101/64     Pulse Rate 02/19/22 0933 100     Resp 02/19/22 0933 18     Temp 02/19/22 0933 97.9 F (36.6 C)     Temp Source 02/19/22 0933 Oral     SpO2 02/19/22 0933 96 %     Weight --      Height --      Head Circumference --      Peak Flow --      Pain Score 02/19/22 0929 9     Pain Loc --      Pain Edu? --      Excl. in [place]? --     Most recent vital signs: Vitals:   02/19/22 1200 02/19/22 1230  BP: 112/60 (!) 113/58  Pulse: 83 85  Resp: 13 19  Temp:    SpO2: 100% 98%     Constitutional: Alert  Eyes: Conjunctivae are normal.  Head: Atraumatic. Nose: No congestion/rhinnorhea. Mouth/Throat: Mucous membranes are moist.   Neck: Painless ROM.  Cardiovascular:   Good peripheral circulation. No m/g/r Respiratory: Normal respiratory effort.  No retractions.  Gastrointestinal: Soft and nontender.  Musculoskeletal:  no deformity Neurologic:  MAE spontaneously. No gross focal neurologic deficits are appreciated.  Skin:  Skin is warm, dry and intact. No rash noted. Psychiatric: Mood and affect are normal. Speech and behavior are normal.    ED Results / Procedures / Treatments   Labs (all labs ordered are listed, but only abnormal results are displayed) Labs Reviewed  COMPREHENSIVE METABOLIC PANEL -  Abnormal; Notable for the following components:      Result Value   CO2 20 (*)    BUN 27 (*)    Total Bilirubin 1.4 (*)    All other components within normal limits  CBC - Abnormal; Notable for the following components:   WBC 16.7 (*)    All other components within normal limits  LIPASE, BLOOD  URINALYSIS, ROUTINE W REFLEX MICROSCOPIC     EKG     RADIOLOGY Please see ED Course for my review and interpretation.  I personally reviewed all radiographic images ordered to evaluate for the above acute complaints and reviewed radiology reports and findings.  These findings were personally discussed with the patient.  Please see medical record for radiology report.    PROCEDURES:  Critical Care performed:   Procedures   MEDICATIONS ORDERED IN ED: Medications  morphine (PF) 4 MG/ML injection 4 mg (4 mg Intravenous Given 02/19/22 1127)  cefTRIAXone (ROCEPHIN) 1 g in sodium chloride 0.9 % 100 mL IVPB (1 g Intravenous New Bag/Given 02/19/22 1312)  metroNIDAZOLE (FLAGYL) IVPB 500 mg (has no administration in time range)  sodium chloride 0.9 % bolus 1,000 mL (0 mLs Intravenous Stopped 02/19/22  1313)  ondansetron (ZOFRAN) injection 4 mg (4 mg Intravenous Given 02/19/22 1127)  iohexol (OMNIPAQUE) 300 MG/ML solution 100 mL (100 mLs Intravenous Contrast Given 02/19/22 1139)     IMPRESSION / MDM / ASSESSMENT AND PLAN / ED COURSE  I reviewed the triage vital signs and the nursing notes.                              Differential diagnosis includes, but is not limited to, colitis, abscess, perforation, enteritis, SBO, stone, UTI  Patient presented to the ER for evaluation of abdominal pain as described above.  This presenting complaint could reflect a potentially life-threatening illness therefore laboratory and ct  evaluation will be sent to evaluate for the above complaints.  Will order IV fluids as well as IV morphine.    Clinical Course as of 02/19/22 1316  Tue Feb 19, 2022  1156 CT  abdomen pelvis on my interpretation shows evidence of probable abnormality of the liver will await formal radiology report. [PR]  1302 Case discussed in consultation with Dr. Peyton Najjar of general surgery.  Given question of possible abscess or microperforation agrees with recommendation for admission for IV antibiotics.  We will follow along.  Will consult hospitalist for admission.  Patient has several listed allergies to antibiotics but states that she has tolerated all of these in the past and no anaphylactic reaction previously.  Pain is controlled after receiving IV morphine.  Remains hemodynamically stable. [PR]    Clinical Course User Index [PR] Merlyn Lot, MD    Patient's presentation is most consistent with acute presentation with potential threat to life or bodily function.   FINAL CLINICAL IMPRESSION(S) / ED DIAGNOSES   Final diagnoses:  Acute diverticulitis of intestine  Left lower quadrant abdominal pain     Rx / DC Orders   ED Discharge Orders     None        Note:  This document was prepared using Dragon voice recognition software and may include unintentional dictation errors.    Merlyn Lot, MD 02/19/22 1316

## 2022-02-19 NOTE — Consult Note (Signed)
SURGICAL CONSULTATION NOTE   HISTORY OF PRESENT ILLNESS (HPI):  76 y.o. female presented to [facility] ED for evaluation of abdominal pain. Patient reports started with severe abdominal pain earlier today.  Pain localized to the left lower quadrant.  Pain does not radiate to the part of body.  Pain is aggravated by applying pressure.  There has been no alleviating factors.  Patient denies any fever or chills.  At the ED she was found with tenderness on the left lower quadrant.  She was also found with leukocytosis.  CT scan of the abdomen and pelvis shows acute diverticulitis with possible 3 cm gas collection.  No free air or free fluid.  I personally evaluated the images.  Patient with history of previous diverticulitis many years ago.  Last episode of intra-abdominal pain was described as colitis and was in 2019.  I personally evaluated the images of the CT scan of 2019.  Surgery is consulted by Dr. Jamse Arn in this context for evaluation and management of diverticulitis.  PAST MEDICAL HISTORY (PMH):  Past Medical History:  Diagnosis Date   Arthritis    bil hands/thumbs   Chest pain, non-cardiac 2010   a. stress echo 2010: nl treadmill EKG w/o evidence of ischemia or arrhythmia, good exercise tolerance for age, normal stress echo images w/o evidence of myocardial ischemia; b. baseline echo with EF 55% peak stress showed nl LV systolic function wtih EF 65% w/o evidence of HK   Complication of anesthesia    Coronary artery disease    Cyst, ovarian 12/29/2011   Serous cystadenomas, bilateral.  S/pBSO May 2019 ([facility])    Diverticulitis of colon with hemorrhage August 2009   Hospitalized [facility]   Family history of adverse reaction to anesthesia    mom and maternal grandmother-agitated   H/O syncope    a. echo 2010: EF 60%, LV nl size, RV nl size & fxn, atria nl size, aorta nl, no pericardial effusion, aortic valve nl, mitral valve nl w/ mild insufficiency, tricuspid valve nl w/ mild insufficiency,  pulmonic valve nl   Heart murmur    asymptomatic-in pt's 20's   Migraine headache    h/o migraines   PONV (postoperative nausea and vomiting)    Scoliosis of thoracic spine    Shingles    Syncope and collapse      PAST SURGICAL HISTORY ([place]):  Past Surgical History:  Procedure Laterality Date   ABDOMINAL HYSTERECTOMY  1991   APPENDECTOMY  1963   COLONOSCOPY     ESOPHAGOGASTRODUODENOSCOPY     LAPAROSCOPIC BILATERAL SALPINGO OOPHERECTOMY Bilateral 02/20/2018   Procedure: LAPAROSCOPIC BILATERAL SALPINGO OOPHORECTOMY;  Surgeon: Ward, Honor Loh, MD;  Location: [facility] ORS;  Service: Gynecology;  Laterality: Bilateral;   LAPAROSCOPIC LYSIS OF ADHESIONS  02/20/2018   Procedure: LAPAROSCOPIC LYSIS OF ADHESIONS;  Surgeon: Ward, Honor Loh, MD;  Location: [facility] ORS;  Service: Gynecology;;   TONSILLECTOMY     age 70 or 6     MEDICATIONS:  Prior to Admission medications   Medication Sig Start Date End Date Taking? Authorizing Provider  sodium chloride (MURO 128) 5 % ophthalmic ointment Place 1 application into the right eye at bedtime.   Yes [provider]  zolpidem (AMBIEN CR) 12.5 MG CR tablet Take 1 tablet (12.5 mg total) by mouth at bedtime as needed. 02/05/22 08/04/22 Yes Crecencio Mc, MD  Cholecalciferol (VITAMIN D3) 1000 UNITS CAPS Take 1 capsule by mouth every other day. Alternate with the magnesium for leg pain  [provider]  doxycycline (VIBRA-TABS) 100 MG tablet Take 1 tablet (100 mg total) by mouth 2 (two) times daily. With food x 7-10 days 01/17/22   McLean-Scocuzza, Nino Glow, MD  fluticasone [facility]) 50 MCG/ACT nasal spray Place 2 sprays into both nostrils daily as needed for allergies or rhinitis. 01/17/22   McLean-Scocuzza, Nino Glow, MD  mirtazapine (REMERON) 15 MG tablet TAKE 1/2 TO 1 TABLET BY MOUTH DAILY 30 MINUTES BEFORE BEDTIME Patient not taking: Reported on 01/17/2022 09/13/21   Crecencio Mc, MD  mometasone (ELOCON) 0.1 % lotion 2-4 gtts each ear qhs prn  dry skin and itching Patient not taking: Reported on 01/17/2022 07/17/21     ondansetron (ZOFRAN) 4 MG tablet Take 1 tablet (4 mg total) by mouth every 8 (eight) hours as needed. 01/17/22   McLean-Scocuzza, Nino Glow, MD  rosuvastatin (CRESTOR) 20 MG tablet Take 1 tablet (20 mg total) by mouth 2 (two) times a week. Patient not taking: Reported on 01/17/2022 08/27/21   Crecencio Mc, MD  sodium chloride (OCEAN) 0.65 % SOLN nasal spray Place 2 sprays into both nostrils as needed for congestion. Followed by flonase Patient not taking: Reported on 02/19/2022 01/17/22   McLean-Scocuzza, Nino Glow, MD  tretinoin (RETIN-A) 0.1 % cream Apply a pea sized amount nightly. Patient not taking: Reported on 01/17/2022 04/12/21        ALLERGIES:  Allergies  Allergen Reactions   Sulfa Antibiotics Hives   Flagyl [Metronidazole] Other (See Comments)    Can't remember but remembers it was not a good experience   Aspirin Rash   Azithromycin Rash    Within the hour   Ciprofloxacin Nausea Only and Rash    After 3 day   Erythromycin Rash    Confirmed Feb 2019   Ibuprofen Rash   Nitrofurantoin Rash   Penicillins Rash    Has patient had a PCN reaction causing immediate rash, facial/tongue/throat swelling, SOB or lightheadedness with hypotension: Yes Has patient had a PCN reaction causing severe rash involving mucus membranes or skin necrosis: No Has patient had a PCN reaction that required hospitalization: No Has patient had a PCN reaction occurring within the last 10 years: No If all of the above answers are "NO", then may proceed with Cephalosporin use.      SOCIAL HISTORY:  Social History   Socioeconomic History   Marital status: Married    Spouse name: Not on file   Number of children: Not on file   Years of education: Not on file   Highest education level: Not on file  Occupational History   Not on file  Tobacco Use   Smoking status: Never   Smokeless tobacco: Never  Vaping Use   Vaping Use:  Never used  Substance and Sexual Activity   Alcohol use: No   Drug use: No   Sexual activity: Not on file  Other Topics Concern   Not on file  Social History Narrative   Not on file   Social Determinants of Health   Financial Resource Strain: Not on file  Food Insecurity: Not on file  Transportation Needs: Not on file  Physical Activity: Not on file  Stress: Not on file  Social Connections: Not on file  Intimate Partner Violence: Not on file      FAMILY HISTORY:  Family History  Problem Relation Age of Onset   COPD Mother 90       respiratory failure/fibrosis, seizures   Stroke Mother  Hypertension Mother    Supraventricular tachycardia Mother    COPD Father 62   Mental illness Father        Parkinson's Dementia   Aneurysm Father 8   Stroke Son 42       hypertensive, left brain    Heart attack Maternal Grandfather 14       passed   Stroke Maternal Grandmother    Breast cancer Maternal Aunt      REVIEW OF SYSTEMS:  Constitutional: denies weight loss, fever, chills, or sweats  Eyes: denies any other vision changes, history of eye injury  ENT: denies sore throat, hearing problems  Respiratory: denies shortness of breath, wheezing  Cardiovascular: denies chest pain, palpitations  Gastrointestinal: Positive abdominal pain, nausea and vomiting Genitourinary: denies burning with urination or urinary frequency Musculoskeletal: denies any other joint pains or cramps  Skin: denies any other rashes or skin discolorations  Neurological: denies any other headache, dizziness, weakness  Psychiatric: denies any other depression, anxiety   All other review of systems were negative   VITAL SIGNS:  Temp:  [97.9 F (36.6 C)-98.8 F (37.1 C)] 98.8 F (37.1 C) (05/23 1536) Pulse Rate:  [83-100] 96 (05/23 1536) Resp:  [13-20] 18 (05/23 1536) BP: (101-129)/(52-64) 120/52 (05/23 1536) SpO2:  [96 %-100 %] 100 % (05/23 1536)     Height: '5\' 3"'$  (160 cm)       INTAKE/OUTPUT:   This shift: No intake/output data recorded.  Last 2 shifts: '@IOLAST2SHIFTS'$ @   PHYSICAL EXAM:  Constitutional:  -- Normal body habitus  -- Awake, alert, and oriented x3  Eyes:  -- Pupils equally round and reactive to light  -- No scleral icterus  Ear, nose, and throat:  -- No jugular venous distension  Pulmonary:  -- No crackles  -- Equal breath sounds bilaterally -- Breathing non-labored at rest Cardiovascular:  -- S1, S2 present  -- No pericardial rubs Gastrointestinal:  -- Abdomen soft, mild tender in the left lower quadrant, non-distended, no guarding or rebound tenderness -- No abdominal masses appreciated, pulsatile or otherwise  Musculoskeletal and Integumentary:  -- Wounds: None appreciated -- Extremities: B/L UE and LE FROM, hands and feet warm, no edema  Neurologic:  -- Motor function: intact and symmetric -- Sensation: intact and symmetric   Labs:     Latest Ref Rng & Units 02/19/2022    9:31 AM 07/13/2020    9:49 AM 02/13/2018   12:01 PM  CBC  WBC 4.0 - 10.5 K/uL 16.7   4.2   6.2    Hemoglobin 12.0 - 15.0 g/dL 13.3   14.0   14.4    Hematocrit 36.0 - 46.0 % 41.0   41.6   42.6    Platelets 150 - 400 K/uL 206   239.0   256        Latest Ref Rng & Units 02/19/2022    9:31 AM 08/21/2021    3:39 PM 03/16/2021    4:03 PM  CMP  Glucose 70 - 99 mg/dL 90   88   113    BUN 8 - 23 mg/dL '27   19   15    '$ Creatinine 0.44 - 1.00 mg/dL 0.90   0.72   0.80    Sodium 135 - 145 mmol/L 135   138   138    Potassium 3.5 - 5.1 mmol/L 3.8   3.9   4.0    Chloride 98 - 111 mmol/L 102   103   102  CO2 22 - 32 mmol/L '20   28   26    '$ Calcium 8.9 - 10.3 mg/dL 9.5   9.2   9.7    Total Protein 6.5 - 8.1 g/dL 7.9   7.1   7.0    Total Bilirubin 0.3 - 1.2 mg/dL 1.4   0.7   0.5    Alkaline Phos 38 - 126 U/L 67   69     AST 15 - 41 U/L '21   18   17    '$ ALT 0 - 44 U/L '16   12   16      '$ Imaging studies:  EXAM: CT ABDOMEN AND PELVIS WITH CONTRAST   TECHNIQUE: Multidetector CT  imaging of the abdomen and pelvis was performed using the standard protocol following bolus administration of intravenous contrast.   RADIATION DOSE REDUCTION: This exam was performed according to the departmental dose-optimization program which includes automated exposure control, adjustment of the mA and/or kV according to patient size and/or use of iterative reconstruction technique.   CONTRAST:  154m OMNIPAQUE IOHEXOL 300 MG/ML  SOLN   COMPARISON:  December 04, 2017.   FINDINGS: Lower chest: No acute abnormality.   Hepatobiliary: No focal liver abnormality is seen. No gallstones, gallbladder wall thickening, or biliary dilatation.   Pancreas: Unremarkable. No pancreatic ductal dilatation or surrounding inflammatory changes.   Spleen: Normal in size without focal abnormality.   Adrenals/Urinary Tract: Adrenal glands are unremarkable. Kidneys are normal, without renal calculi, focal lesion, or hydronephrosis. Bladder is unremarkable.   Stomach/Bowel: Status post appendectomy. Stomach is unremarkable. There is no evidence of bowel obstruction. Proximal sigmoid diverticulitis is noted. 3.3 cm rounded abnormality containing predominantly air and some fluid is noted adjacent to the inflamed segment of colon. It is uncertain if this represents small abscess, enlarged diverticulum or possibly contained perforation.   Vascular/Lymphatic: Aortic atherosclerosis. No enlarged abdominal or pelvic lymph nodes.   Reproductive: Status post hysterectomy. No adnexal masses.   Other: No abdominal wall hernia or abnormality. No abdominopelvic ascites.   Musculoskeletal: No acute or significant osseous findings.   IMPRESSION: Proximal sigmoid diverticulitis is noted. 3.3 cm rounded abnormality containing predominantly air and some fluid is noted adjacent to the inflamed segment of colon; it is uncertain if this represents small abscess, enlarged diverticulum or possibly contained  perforation.   Aortic Atherosclerosis (ICD10-I70.0).     Electronically Signed   By: JMarijo ConceptionM.D.   On: 02/19/2022 12:00    Assessment/Plan:  76y.o. female with acute diverticulitis, questionable completed diverticulitis, complicated by pertinent comorbidities including history of colitis and diverticulitis.  Patient with acute diverticulitis questionable abscess.  Abdominal exam with localized tenderness in the left lower quadrant.  Vital signs are stable.  No acute abdomen.  I agree with medical management of acute diverticulitis with IV antibiotic therapy, clear liquid diet.  We will follow with vital signs, physical exam, labs.  If she responds to IV antibiotic therapy no surgical management will be indicated during this admission.  I will follow-up closely.  EArnold Long MD

## 2022-02-19 NOTE — ED Triage Notes (Signed)
Pt c/o abd pain with N/V/D for the past 2 days, has a hx of diverticulitis.

## 2022-02-20 ENCOUNTER — Ambulatory Visit: Payer: 59 | Admitting: Internal Medicine

## 2022-02-20 ENCOUNTER — Other Ambulatory Visit: Payer: Self-pay

## 2022-02-20 DIAGNOSIS — D649 Anemia, unspecified: Secondary | ICD-10-CM

## 2022-02-20 DIAGNOSIS — K5792 Diverticulitis of intestine, part unspecified, without perforation or abscess without bleeding: Secondary | ICD-10-CM

## 2022-02-20 DIAGNOSIS — D72829 Elevated white blood cell count, unspecified: Secondary | ICD-10-CM | POA: Diagnosis present

## 2022-02-20 LAB — CBC
HCT: 32.7 % — ABNORMAL LOW (ref 36.0–46.0)
Hemoglobin: 10.4 g/dL — ABNORMAL LOW (ref 12.0–15.0)
MCH: 28.8 pg (ref 26.0–34.0)
MCHC: 31.8 g/dL (ref 30.0–36.0)
MCV: 90.6 fL (ref 80.0–100.0)
Platelets: 159 10*3/uL (ref 150–400)
RBC: 3.61 MIL/uL — ABNORMAL LOW (ref 3.87–5.11)
RDW: 12.4 % (ref 11.5–15.5)
WBC: 9.5 10*3/uL (ref 4.0–10.5)
nRBC: 0 % (ref 0.0–0.2)

## 2022-02-20 LAB — BASIC METABOLIC PANEL
Anion gap: 8 (ref 5–15)
BUN: 17 mg/dL (ref 8–23)
CO2: 19 mmol/L — ABNORMAL LOW (ref 22–32)
Calcium: 8 mg/dL — ABNORMAL LOW (ref 8.9–10.3)
Chloride: 108 mmol/L (ref 98–111)
Creatinine, Ser: 0.67 mg/dL (ref 0.44–1.00)
GFR, Estimated: >60 mL/min (ref >60)
Glucose, Bld: 61 mg/dL — ABNORMAL LOW (ref 70–99)
Potassium: 3.5 mmol/L (ref 3.5–5.1)
Sodium: 135 mmol/L (ref 135–145)

## 2022-02-20 MED ORDER — OXYCODONE-ACETAMINOPHEN 5-325 MG PO TABS
1.0000 | ORAL_TABLET | Freq: Three times a day (TID) | ORAL | 0 refills | Status: AC | PRN
  Filled 2022-02-20: qty 10, 4d supply, fill #0

## 2022-02-20 MED ORDER — OXYCODONE-ACETAMINOPHEN 5-325 MG PO TABS: 1.0000 | ORAL_TABLET | Freq: Three times a day (TID) | ORAL | 0 refills | Status: DC | PRN

## 2022-02-20 MED ORDER — ACETAMINOPHEN 325 MG PO TABS
650.0000 mg | ORAL_TABLET | Freq: Four times a day (QID) | ORAL | 0 refills | Status: DC | PRN
  Filled 2022-02-20: qty 30, 4d supply, fill #0

## 2022-02-20 MED ORDER — ONDANSETRON 8 MG PO TBDP
8.0000 mg | ORAL_TABLET | Freq: Three times a day (TID) | ORAL | 3 refills | Status: DC | PRN
  Filled 2022-02-20: qty 20, 7d supply, fill #0

## 2022-02-20 MED ORDER — CEFIXIME 200 MG PO CHEW
200.0000 mg | CHEWABLE_TABLET | Freq: Every day | ORAL | 0 refills | Status: DC
  Filled 2022-02-20: qty 7, 7d supply, fill #0

## 2022-02-20 MED ORDER — METRONIDAZOLE 500 MG PO TABS
500.0000 mg | ORAL_TABLET | Freq: Three times a day (TID) | ORAL | 0 refills | Status: AC
  Filled 2022-02-20: qty 21, 7d supply, fill #0

## 2022-02-20 MED ORDER — LEVOFLOXACIN 500 MG PO TABS
500.0000 mg | ORAL_TABLET | Freq: Every day | ORAL | 0 refills | Status: AC
  Filled 2022-02-20: qty 7, 7d supply, fill #0

## 2022-02-20 NOTE — Assessment & Plan Note (Signed)
Continuing home [place]

## 2022-02-20 NOTE — Discharge Summary (Signed)
Physician Discharge Summary   Patient: Jean Brooks MRN: [id number] DOB: [birth year]  Admit date:     02/19/2022  Discharge date: 02/20/22  Discharge Physician: Emeterio Reeve   PCP: Crecencio Mc, MD   Recommendations at discharge:  You have been discharged on oral antibiotics - keeping your allergies/intolerances in mind you are not on the typical/preferred regimen but please take antibiotics as directed and use the nausea medication as needed. If you are concerned about a serious reaction please seek medical attention!   Discharge Diagnoses: Principal Problem:   Diverticulitis of colon with perforation Active Problems:   Insomnia, persistent   Hyperlipidemia, mixed   Leukocytosis   Normocytic anemia  Resolved Problems:   * No resolved hospital problems. *  Hospital Course: Jean Brooks is an 76 y.o. 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  Patient the symptoms are similar to her previous episodes of colitis and/or diverticulitis.  Patient was hoping however that it would improve on its own as her episodes of colitis usually do.  However patient noted pain worsened, had diarrhea "about 1 million times" and she noted she was unable to keep p.o. secondary to dry heaves and vomiting.  Patient discussed symptoms with Dr. Vicente Males who advised her to come to the ED right away.   Patient denies overt fevers but does admit to chills and malaise as noted above.    No hematochezia or hematemesis.  Admits to minimal dizziness.  Admits to fatigue and weakness..  No dysuria urgency or frequency.  No hematemesis.  CT scan showed 3.3 cm collection of fluid concerning for contained perforation versus abscess, general surgery recommended conservative management, IR agreed, by morning of 02/20/2022 patient states her pain was significantly improved and she felt ready for  discharge home, see assessment/plan below, given significant clinical improvement and appropriate trending of lab values and vital signs, patient was felt safe for discharge with close outpatient follow-up and strict precautions to return to ED if worse    Assessment and Plan: * Diverticulitis 05/23: general surgery recommended conservative manage with IV antibiotics, they will follow. CT films reviewed with IR who also noted that given the amount of gas and tight window, they would recommend IV antibiotics and follow-up with films in case of clinical deterioration. Continue ceftriaxone and Flagyl--of note, patient is PCN allergic and is noted to have unspecified "bad reaction" that she does not remember to Flagyl. Pt was feeling well on AM of 02/20/22 and requested discharge, since tolerating diet and clinically improved I felt this was appropriate w/ close follow-up though I did offer at least another midnight stay w/ IV antibiotics, pt prefers discharge and she has capacity to decide and understand risks/benefits.   Normocytic anemia Hgb 10.4 today down from 13.3 on admission Likely hemodilution from IV fluids, will monitor   Leukocytosis WBC trending down from 16.7 on admission --> 9.5 today  Hyperlipidemia, mixed Holding home statin for now  Insomnia, persistent Continuing home ambien      Pain control - [employer] Controlled Substance Reporting System database was reviewed. and patient was instructed, not to drive, operate heavy machinery, perform activities at heights, swimming or participation in water activities or provide baby-sitting services while on Pain, Sleep and Anxiety Medications; until their outpatient Physician has advised to do so again. Also recommended to not to take more than  prescribed Pain, Sleep and Anxiety Medications.  Consultants: general surgery  Procedures performed: none  Disposition: Home Diet recommendation:  Discharge Diet Orders (From admission,  onward)     Start     Ordered   02/20/22 0000  Diet - low sodium heart healthy        02/20/22 1022           Clear liquid diet, advance as tolerated DISCHARGE MEDICATION: Allergies as of 02/20/2022       Reactions   Sulfa Antibiotics Hives   Flagyl [metronidazole] Other (See Comments)   Can't remember but remembers it was not a good experience   Aspirin Rash   Azithromycin Rash   Within the hour   Ciprofloxacin Nausea Only, Rash   After 3 day   Erythromycin Rash   Confirmed Feb 2019   Ibuprofen Rash   Nitrofurantoin Rash   Penicillins Rash   Has patient had a PCN reaction causing immediate rash, facial/tongue/throat swelling, SOB or lightheadedness with hypotension: Yes Has patient had a PCN reaction causing severe rash involving mucus membranes or skin necrosis: No Has patient had a PCN reaction that required hospitalization: No Has patient had a PCN reaction occurring within the last 10 years: No If all of the above answers are "NO", then may proceed with Cephalosporin use.        Medication List     STOP taking these medications    doxycycline 100 MG tablet Commonly known as: VIBRA-TABS   mirtazapine 15 MG tablet Commonly known as: REMERON   mometasone 0.1 % lotion Commonly known as: ELOCON   ondansetron 4 MG tablet Commonly known as: Zofran   rosuvastatin 20 MG tablet Commonly known as: Crestor   sodium chloride 0.65 % Soln nasal spray Commonly known as: OCEAN   tretinoin 0.1 % cream Commonly known as: RETIN-A       TAKE these medications    acetaminophen 325 MG tablet Commonly known as: TYLENOL Take 2 tablets (650 mg total) by mouth every 6 (six) hours as needed for mild pain (or Fever >/= 101).   Cefixime 200 MG Chew Chew 200 mg by mouth daily for 7 days.   fluticasone 50 MCG/ACT nasal spray Commonly known as: FLONASE Place 2 sprays into both nostrils daily as needed for allergies or rhinitis.   metroNIDAZOLE 500 MG  tablet Commonly known as: Flagyl Take 1 tablet (500 mg total) by mouth 3 (three) times daily for 7 days.   ondansetron 8 MG disintegrating tablet Commonly known as: ZOFRAN-ODT Take 1 tablet (8 mg total) by mouth every 8 (eight) hours as needed for nausea.   oxyCODONE-acetaminophen 5-325 MG tablet Commonly known as: PERCOCET/ROXICET Take 1 tablet by mouth every 8 (eight) hours as needed for up to 5 days.   sodium chloride 5 % ophthalmic ointment Commonly known as: MURO 580 Place 1 application into the right eye at bedtime.   Vitamin D3 25 MCG (1000 UT) Caps Take 1 capsule by mouth every other day. Alternate with the magnesium for leg pain   zolpidem 12.5 MG CR tablet Commonly known as: AMBIEN CR Take 1 tablet (12.5 mg total) by mouth at bedtime as needed.        Discharge Exam: Danley Danker Weights   02/19/22 1838 02/19/22 1920  Weight: 50.8 kg 50.8 kg   Constitutional:  VSS, see nurse notes General Appearance: alert, well-developed, well-nourished, NAD Eyes: Normal lids and conjunctive, non-icteric sclera PERRLA Ears, Nose, Mouth, Throat: Normal appearance Normal external  auditory canal and TM bilaterally MMM, posterior pharynx without erythema/exudate Neck: No masses, trachea midline No thyroid enlargement/tenderness/mass appreciated Respiratory: Normal respiratory effort No dullness/hyper-resonance to percussion Breath sounds normal, no wheeze/rhonchi/rales Cardiovascular: S1/S2 normal RRR No lower extremity edema Gastrointestinal: Mild TTP epigastric area w/o rebound or guarding, no masses No hernia appreciated Musculoskeletal:  No clubbing/cyanosis of digits Neurological: No cranial nerve deficit on limited exam Psychiatric: Normal judgment/insight Normal mood and affect   Condition at discharge: good  The results of significant diagnostics from this hospitalization (including imaging, microbiology, ancillary and laboratory) are listed below for  reference.   Imaging Studies: CT ABDOMEN PELVIS W CONTRAST  Result Date: 02/19/2022 CLINICAL DATA:  Acute generalized abdominal pain. EXAM: CT ABDOMEN AND PELVIS WITH CONTRAST TECHNIQUE: Multidetector CT imaging of the abdomen and pelvis was performed using the standard protocol following bolus administration of intravenous contrast. RADIATION DOSE REDUCTION: This exam was performed according to the departmental dose-optimization program which includes automated exposure control, adjustment of the mA and/or kV according to patient size and/or use of iterative reconstruction technique. CONTRAST:  1109m OMNIPAQUE IOHEXOL 300 MG/ML  SOLN COMPARISON:  December 04, 2017. FINDINGS: Lower chest: No acute abnormality. Hepatobiliary: No focal liver abnormality is seen. No gallstones, gallbladder wall thickening, or biliary dilatation. Pancreas: Unremarkable. No pancreatic ductal dilatation or surrounding inflammatory changes. Spleen: Normal in size without focal abnormality. Adrenals/Urinary Tract: Adrenal glands are unremarkable. Kidneys are normal, without renal calculi, focal lesion, or hydronephrosis. Bladder is unremarkable. Stomach/Bowel: Status post appendectomy. Stomach is unremarkable. There is no evidence of bowel obstruction. Proximal sigmoid diverticulitis is noted. 3.3 cm rounded abnormality containing predominantly air and some fluid is noted adjacent to the inflamed segment of colon. It is uncertain if this represents small abscess, enlarged diverticulum or possibly contained perforation. Vascular/Lymphatic: Aortic atherosclerosis. No enlarged abdominal or pelvic lymph nodes. Reproductive: Status post hysterectomy. No adnexal masses. Other: No abdominal wall hernia or abnormality. No abdominopelvic ascites. Musculoskeletal: No acute or significant osseous findings. IMPRESSION: Proximal sigmoid diverticulitis is noted. 3.3 cm rounded abnormality containing predominantly air and some fluid is noted adjacent to  the inflamed segment of colon; it is uncertain if this represents small abscess, enlarged diverticulum or possibly contained perforation. Aortic Atherosclerosis (ICD10-I70.0). Electronically Signed   By: JMarijo ConceptionM.D.   On: 02/19/2022 12:00    Microbiology: Results for orders placed or performed in visit on 11/20/16  Urine Culture     Status: None   Collection Time: 11/20/16  3:50 PM   Specimen: Urine  Result Value Ref Range Status   Organism ID, Bacteria NO GROWTH  Final    Labs: CBC: Recent Labs  Lab 02/19/22 0931 02/20/22 0356  WBC 16.7* 9.5  HGB 13.3 10.4*  HCT 41.0 32.7*  MCV 89.3 90.6  PLT 206 1101  Basic Metabolic Panel: Recent Labs  Lab 02/19/22 0931 02/20/22 0356  NA 135 135  K 3.8 3.5  CL 102 108  CO2 20* 19*  GLUCOSE 90 61*  BUN 27* 17  CREATININE 0.90 0.67  CALCIUM 9.5 8.0*   Liver Function Tests: Recent Labs  Lab 02/19/22 0931  AST 21  ALT 16  ALKPHOS 67  BILITOT 1.4*  PROT 7.9  ALBUMIN 4.0   CBG: No results for input(s): GLUCAP in the last 168 hours.  Discharge time spent: greater than 30 minutes.  Signed: NEmeterio Reeve DO [facility] 02/20/2022

## 2022-02-20 NOTE — Plan of Care (Signed)

## 2022-02-20 NOTE — Assessment & Plan Note (Addendum)
05/23: general surgery recommended conservative manage with IV antibiotics, they will follow. CT films reviewed with IR who also noted that given the amount of gas and tight window, they would recommend IV antibiotics and follow-up with films in case of clinical deterioration. Continue ceftriaxone and Flagyl--of note, patient is PCN allergic and is noted to have unspecified "bad reaction" that she does not remember to Flagyl. Pt was feeling well on AM of 02/20/22 and requested discharge, since tolerating diet and clinically improved I felt this was appropriate w/ close follow-up though I did offer at least another midnight stay w/ IV antibiotics, pt prefers discharge and she has capacity to decide and understand risks/benefits.

## 2022-02-20 NOTE — Assessment & Plan Note (Signed)
WBC trending down from 16.7 on admission --> 9.5 today

## 2022-02-20 NOTE — Progress Notes (Signed)
Patient discharging home, iv removed. Instructions given to patient, verbalized understanding. Patient will transport self home.

## 2022-02-20 NOTE — TOC Initial Note (Signed)
Transition of Care [facility]) - Initial/Assessment Note    Patient Details  Name: Jean Brooks MRN: [id number] Date of Birth: 1946-07-13  Transition of Care [facility]) CM/SW Contact:    Conception Oms, RN Phone Number: 02/20/2022, 10:56 AM  Clinical Narrative:                  Transition of Care [facility]) Screening Note   Patient Details  Name: Jean Brooks Date of Birth: 26-Jan-1946   Transition of Care [facility]) CM/SW Contact:    Conception Oms, RN Phone Number: 02/20/2022, 10:56 AM  PCP [place] [facility]  Transition of Care Department [facility]) has reviewed patient and no TOC needs have been identified at this time. We will continue to monitor patient advancement through interdisciplinary progression rounds. If new patient transition needs arise, please place a TOC consult.          Patient Goals and CMS Choice        Expected Discharge Plan and Services           Expected Discharge Date: 02/20/22                                    Prior Living Arrangements/Services                       Activities of Daily Living Home Assistive Devices/Equipment: Eyeglasses ADL Screening (condition at time of admission) Patient's cognitive ability adequate to safely complete daily activities?: No Is the patient deaf or have difficulty hearing?: No Does the patient have difficulty seeing, even when wearing glasses/contacts?: No Does the patient have difficulty concentrating, remembering, or making decisions?: No Patient able to express need for assistance with ADLs?: No Does the patient have difficulty dressing or bathing?: No Independently performs ADLs?: Yes (appropriate for developmental age) Does the patient have difficulty walking or climbing stairs?: No Weakness of Legs: None Weakness of Arms/Hands: None  Permission Sought/Granted                  Emotional Assessment              Admission diagnosis:  Diverticulitis of  colon with perforation [K57.20] Acute diverticulitis of intestine [K57.92] Left lower quadrant abdominal pain [R10.32] Patient Active Problem List   Diagnosis Date Noted   Leukocytosis 02/20/2022   Normocytic anemia 02/20/2022   Diverticulitis of colon with perforation 02/19/2022   Statin intolerance 08/21/2021   Abdominal aortic atherosclerosis ([place]) 07/15/2020   Low serum cortisol level 07/15/2020   Weight loss 07/08/2019   Coronary artery disease 12/18/2017   Lymphedema 06/25/2017   Chronic venous insufficiency 06/25/2017   Varicose veins of leg with pain, bilateral 05/16/2017   Stenosis of cervical spine 01/02/2017   Hyperlipidemia, mixed 06/27/2016   Osteopenia 06/14/2015   SVT (supraventricular tachycardia) ([place]) 06/07/2014   Encounter for preventive health examination 03/24/2013   Dyspareunia, female 03/23/2013   Constipation 10/06/2012   Migraine headache    Scoliosis of thoracic spine    Insomnia, persistent 12/25/2011   Chest pain, non-cardiac    PCP:  Crecencio Mc, MD Pharmacy:   [place] [address] Phone: [phone] Fax: [phone]  [facility] [address], [place] - [place] [address] Phone: [phone] Fax: [phone]     Social Determinants of  Health (SDOH) Interventions    Readmission Risk Interventions     View : No data to display.

## 2022-02-20 NOTE — Assessment & Plan Note (Signed)
Hgb 10.4 today down from 13.3 on admission Likely hemodilution from IV fluids, will monitor

## 2022-02-20 NOTE — Plan of Care (Signed)

## 2022-02-20 NOTE — Progress Notes (Signed)
Error/disregard

## 2022-02-20 NOTE — Assessment & Plan Note (Signed)
Holding home statin for now

## 2022-02-22 ENCOUNTER — Telehealth: Payer: Self-pay

## 2022-02-22 NOTE — Telephone Encounter (Signed)
Patient hospital follow up with surgery within the next 2 weeks, Dr. Cassell Smiles. 6 month follow up scheduled with PCP 03/25/22.

## 2022-02-26 ENCOUNTER — Other Ambulatory Visit: Payer: Self-pay | Admitting: Gastroenterology

## 2022-02-26 ENCOUNTER — Other Ambulatory Visit: Payer: Self-pay

## 2022-02-26 MED ORDER — FLUCONAZOLE 150 MG PO TABS
150.0000 mg | ORAL_TABLET | Freq: Once | ORAL | 0 refills | Status: AC
  Filled 2022-02-26: qty 1, 1d supply, fill #0

## 2022-02-27 ENCOUNTER — Other Ambulatory Visit: Payer: Self-pay

## 2022-02-27 DIAGNOSIS — K5792 Diverticulitis of intestine, part unspecified, without perforation or abscess without bleeding: Secondary | ICD-10-CM

## 2022-03-04 ENCOUNTER — Other Ambulatory Visit: Payer: Self-pay

## 2022-03-04 ENCOUNTER — Telehealth: Payer: Self-pay | Admitting: Internal Medicine

## 2022-03-04 MED ORDER — ZOLPIDEM TARTRATE ER 12.5 MG PO TBCR
12.5000 mg | EXTENDED_RELEASE_TABLET | Freq: Every evening | ORAL | 5 refills | Status: DC | PRN
  Filled 2022-03-04 – 2022-03-05 (×2): qty 30, 30d supply, fill #0
  Filled 2022-03-29 – 2022-04-01 (×2): qty 30, 30d supply, fill #1
  Filled 2022-04-30: qty 30, 30d supply, fill #2
  Filled 2022-05-28: qty 30, 30d supply, fill #3
  Filled 2022-06-26 – 2022-06-27 (×2): qty 30, 30d supply, fill #4
  Filled 2022-07-26: qty 19, 19d supply, fill #5
  Filled 2022-07-30: qty 29, 29d supply, fill #5
  Filled 2022-07-30: qty 11, 11d supply, fill #5

## 2022-03-04 NOTE — Telephone Encounter (Signed)
Pt called back. Pt is aware that medication has been refilled and that it will be up to the pharmacy for them to refill the rx early.

## 2022-03-04 NOTE — Telephone Encounter (Signed)
LMTCB

## 2022-03-04 NOTE — Telephone Encounter (Signed)
Patient dropped her zolpidem (AMBIEN CR) 12.5 MG CR tablet and it went under the refrigerator, can she get a early refill of 4 pills until next refill, due on 03/08/2022.

## 2022-03-04 NOTE — Addendum Note (Signed)
Addended by: Crecencio Mc on: 03/04/2022 10:43 AM   Modules accepted: Orders

## 2022-03-05 ENCOUNTER — Other Ambulatory Visit: Payer: Self-pay

## 2022-03-06 DIAGNOSIS — H903 Sensorineural hearing loss, bilateral: Secondary | ICD-10-CM | POA: Diagnosis not present

## 2022-03-06 DIAGNOSIS — H6123 Impacted cerumen, bilateral: Secondary | ICD-10-CM | POA: Diagnosis not present

## 2022-03-13 ENCOUNTER — Other Ambulatory Visit: Payer: Self-pay | Admitting: General Surgery

## 2022-03-13 DIAGNOSIS — K5720 Diverticulitis of large intestine with perforation and abscess without bleeding: Secondary | ICD-10-CM

## 2022-03-14 ENCOUNTER — Ambulatory Visit
Admission: RE | Admit: 2022-03-14 | Discharge: 2022-03-14 | Disposition: A | Discharge status: Home / Self Care | Payer: 59 | Source: Ambulatory Visit | Attending: General Surgery | Admitting: General Surgery

## 2022-03-14 ENCOUNTER — Other Ambulatory Visit: Payer: Self-pay

## 2022-03-14 DIAGNOSIS — K5720 Diverticulitis of large intestine with perforation and abscess without bleeding: Secondary | ICD-10-CM | POA: Insufficient documentation

## 2022-03-14 DIAGNOSIS — I700 Atherosclerosis of aorta: Secondary | ICD-10-CM | POA: Diagnosis not present

## 2022-03-14 DIAGNOSIS — R109 Unspecified abdominal pain: Secondary | ICD-10-CM | POA: Diagnosis not present

## 2022-03-14 MED ORDER — AMOXICILLIN-POT CLAVULANATE 500-125 MG PO TABS
ORAL_TABLET | ORAL | 0 refills | Status: DC
  Filled 2022-03-14: qty 9, 3d supply, fill #0
  Filled 2022-03-14: qty 21, 7d supply, fill #0

## 2022-03-14 MED ORDER — IOHEXOL 300 MG/ML  SOLN
80.0000 mL | Freq: Once | INTRAMUSCULAR | Status: AC | PRN
  Administered 2022-03-14: 80 mL via INTRAVENOUS

## 2022-03-15 ENCOUNTER — Telehealth: Payer: Self-pay | Admitting: General Surgery

## 2022-03-15 NOTE — Telephone Encounter (Signed)
Patient oriented about CT scan results.  Patient with persistent pericolonic abscess.  Recurrence of inflammation.  Patient came to my office walking in no distress.  Localized tenderness in the left lower quadrant.  No fever.  We will try outpatient antibiotic therapy with clear liquid diet.  Patient oriented that if she does not respond to oral antibiotic therapy will need to be admitted for IV antibiotic therapy.  Case discussed with IR and abscess is not amenable for drainage.

## 2022-03-19 ENCOUNTER — Encounter: Payer: Self-pay | Admitting: Internal Medicine

## 2022-03-20 ENCOUNTER — Other Ambulatory Visit: Payer: Self-pay

## 2022-03-20 ENCOUNTER — Encounter: Payer: Self-pay | Admitting: Internal Medicine

## 2022-03-20 ENCOUNTER — Telehealth: Payer: Self-pay

## 2022-03-20 ENCOUNTER — Ambulatory Visit: Payer: 59 | Admitting: Internal Medicine

## 2022-03-20 VITALS — BP 100/64 | HR 68 | Temp 97.9°F | Ht 63.0 in | Wt 104.4 lb

## 2022-03-20 DIAGNOSIS — T466X5A Adverse effect of antihyperlipidemic and antiarteriosclerotic drugs, initial encounter: Secondary | ICD-10-CM | POA: Diagnosis not present

## 2022-03-20 DIAGNOSIS — I700 Atherosclerosis of aorta: Secondary | ICD-10-CM

## 2022-03-20 DIAGNOSIS — M791 Myalgia, unspecified site: Secondary | ICD-10-CM

## 2022-03-20 DIAGNOSIS — K5792 Diverticulitis of intestine, part unspecified, without perforation or abscess without bleeding: Secondary | ICD-10-CM

## 2022-03-20 DIAGNOSIS — E559 Vitamin D deficiency, unspecified: Secondary | ICD-10-CM

## 2022-03-20 DIAGNOSIS — D649 Anemia, unspecified: Secondary | ICD-10-CM

## 2022-03-20 LAB — COMPREHENSIVE METABOLIC PANEL
ALT: 8 U/L (ref 0–35)
AST: 17 U/L (ref 0–37)
Albumin: 3.6 g/dL (ref 3.5–5.2)
Alkaline Phosphatase: 57 U/L (ref 39–117)
BUN: 11 mg/dL (ref 6–23)
CO2: 28 mEq/L (ref 19–32)
Calcium: 9.2 mg/dL (ref 8.4–10.5)
Chloride: 101 mEq/L (ref 96–112)
Creatinine, Ser: 0.64 mg/dL (ref 0.40–1.20)
GFR: 86.86 mL/min (ref >60.00)
Glucose, Bld: 84 mg/dL (ref 70–99)
Potassium: 4 mEq/L (ref 3.5–5.1)
Sodium: 137 mEq/L (ref 135–145)
Total Bilirubin: 0.3 mg/dL (ref 0.2–1.2)
Total Protein: 6.4 g/dL (ref 6.0–8.3)

## 2022-03-20 LAB — CBC WITH DIFFERENTIAL/PLATELET
Basophils Absolute: 0 10*3/uL (ref 0.0–0.1)
Basophils Relative: 0.9 % (ref 0.0–3.0)
Eosinophils Absolute: 0.1 10*3/uL (ref 0.0–0.7)
Eosinophils Relative: 1.3 % (ref 0.0–5.0)
HCT: 35.6 % — ABNORMAL LOW (ref 36.0–46.0)
Hemoglobin: 11.9 g/dL — ABNORMAL LOW (ref 12.0–15.0)
Lymphocytes Relative: 25.6 % (ref 12.0–46.0)
Lymphs Abs: 1.1 10*3/uL (ref 0.7–4.0)
MCHC: 33.4 g/dL (ref 30.0–36.0)
MCV: 86.6 fl (ref 78.0–100.0)
Monocytes Absolute: 0.5 10*3/uL (ref 0.1–1.0)
Monocytes Relative: 12 % (ref 3.0–12.0)
Neutro Abs: 2.6 10*3/uL (ref 1.4–7.7)
Neutrophils Relative %: 60.2 % (ref 43.0–77.0)
Platelets: 257 10*3/uL (ref 150.0–400.0)
RBC: 4.11 Mil/uL (ref 3.87–5.11)
RDW: 13.1 % (ref 11.5–15.5)
WBC: 4.4 10*3/uL (ref 4.0–10.5)

## 2022-03-20 LAB — IBC + FERRITIN
Ferritin: 175.1 ng/mL (ref 10.0–291.0)
Iron: 76 ug/dL (ref 42–145)
Saturation Ratios: 27.3 % (ref 20.0–50.0)
TIBC: 278.6 ug/dL (ref 250.0–450.0)
Transferrin: 199 mg/dL — ABNORMAL LOW (ref 212.0–360.0)

## 2022-03-20 LAB — B12 AND FOLATE PANEL
Folate: 9.4 ng/mL (ref >5.9)
Vitamin B-12: 501 pg/mL (ref 211–911)

## 2022-03-20 LAB — VITAMIN D 25 HYDROXY (VIT D DEFICIENCY, FRACTURES): VITD: 34.17 ng/mL (ref 30.00–100.00)

## 2022-03-20 MED ORDER — ATORVASTATIN CALCIUM 20 MG PO TABS
20.0000 mg | ORAL_TABLET | ORAL | 2 refills | Status: DC
  Filled 2022-03-20: qty 24, 84d supply, fill #0

## 2022-03-20 MED ORDER — FLUCONAZOLE 150 MG PO TABS
150.0000 mg | ORAL_TABLET | Freq: Every day | ORAL | 0 refills | Status: DC
  Filled 2022-03-20: qty 2, 2d supply, fill #0

## 2022-03-20 MED ORDER — ONDANSETRON 4 MG PO TBDP
4.0000 mg | ORAL_TABLET | Freq: Three times a day (TID) | ORAL | 0 refills | Status: DC | PRN
  Filled 2022-03-20: qty 20, 7d supply, fill #0

## 2022-03-20 NOTE — Assessment & Plan Note (Signed)
Reviewed findings of prior CT scan today..  Patient is willing to  Initiate statin therapy starting with 20 mg crestor twice weekly

## 2022-03-20 NOTE — Addendum Note (Signed)
Addended by: Crecencio Mc on: 03/20/2022 04:24 PM   Modules accepted: Orders

## 2022-03-20 NOTE — Patient Instructions (Addendum)
Start a probiotic  today.  Any generic version of Culturelle, align or floraque ,  or any serving of yogurt   daily    A left sided kidney  lesion needs follow up when you are better  WITH ANOTHER IMAGING STUDY    The" aortic atherosclerosis"  that was noted on your recent chest x ray places you at increased risk for heart attack and stroke.  I recommend that you RESTART Atorvastatin:  2 times a week after you finish the antibiotics

## 2022-03-20 NOTE — Telephone Encounter (Signed)
Fluconazole sent to [facility]

## 2022-03-20 NOTE — Telephone Encounter (Signed)
LMTCB

## 2022-03-20 NOTE — Assessment & Plan Note (Addendum)
With abscess formation .  She is currently taking a 2nd round of antibiotics and following up with Gen Surg to determine if open surgery is nNeeded .  Probiotic advised . She has no leukocytosis by repeat CBC today    Lab Results  Component Value Date   WBC 4.4 03/20/2022   HGB 11.9 (L) 03/20/2022   HCT 35.6 (L) 03/20/2022   MCV 86.6 03/20/2022   PLT 257.0 03/20/2022   Lab Results  Component Value Date   ALT 8 03/20/2022   AST 17 03/20/2022   ALKPHOS 57 03/20/2022   BILITOT 0.3 03/20/2022

## 2022-03-20 NOTE — Progress Notes (Signed)
Subjective:  Patient ID: Jean Brooks, female    DOB: 25-Dec-1945  Age: 76 y.o. MRN: [id number]  CC: The primary encounter diagnosis was Anemia, unspecified type. Diagnoses of Vitamin D deficiency, Myalgia due to statin, Diverticulitis, Normocytic anemia, and Abdominal aortic atherosclerosis ([place]) were also pertinent to this visit.   HPI Jean Brooks  is a 76 yr old female with severe diverticulosis who is under treatment for diverticulitis.  Initial episode occurred  in May and was complicated by abscess formation by CT.    Treated in house initially  with IV abx,  then discharged  on cipro/flagyl,  abscess became larger  based on repeat CT, . He is now taking Augmentin since last Thursday and  clear liquid diet by Gen Surg Olevia Bowens  for ten days  ending this Sunday.  Taking benadryl to prevent rash from augmentin .  Feeling ok  occasional nausea   BOWELS moving 2-3 daily.  Stool are soft and  formed   Has only worked 7 days out of the last 4 weeks   2) aortic atherosclerosis noted on CT : discussed ,  prior trial of atorvastatin caused nausea.  Willing to retry every other day dosing after her diverticulitis has resolved.   3)  left renal lesion incompletely characterized on CT.  Marland Kitchen   Needs MRI once diverticulitis has resolved.  Outpatient Medications Prior to Visit  Medication Sig Dispense Refill   acetaminophen (TYLENOL) 325 MG tablet Take 2 tablets (650 mg total) by mouth every 6 (six) hours as needed for mild pain (or Fever >/= 101). 30 tablet 0   amoxicillin-clavulanate (AUGMENTIN) 500-125 MG tablet Take 1 tablet (500 mg total) by mouth 3 (three) times daily for 10 days 30 tablet 0   Cholecalciferol (VITAMIN D3) 1000 UNITS CAPS Take 1 capsule by mouth every other day. Alternate with the magnesium for leg pain     diphenhydrAMINE (BENADRYL) 25 MG tablet Take 25 mg by mouth daily.     fluticasone (FLONASE) 50 MCG/ACT nasal spray Place 2 sprays into both nostrils daily as needed for  allergies or rhinitis. 16 g 6   sodium chloride (MURO 128) 5 % ophthalmic ointment Place 1 application into the right eye at bedtime.     zolpidem (AMBIEN CR) 12.5 MG CR tablet Take 1 tablet (12.5 mg total) by mouth at bedtime as needed. 30 tablet 5   ondansetron (ZOFRAN-ODT) 8 MG disintegrating tablet Take 1 tablet (8 mg total) by mouth every 8 (eight) hours as needed for nausea. 20 tablet 3   No facility-administered medications prior to visit.    Review of Systems;  Patient denies headache, fevers, malaise, unintentional weight loss, skin rash, eye pain, sinus congestion and sinus pain, sore throat, dysphagia,  hemoptysis , cough, dyspnea, wheezing, chest pain, palpitations, orthopnea, edema, abdominal pain, nausea, melena, diarrhea, constipation, flank pain, dysuria, hematuria, urinary  Frequency, nocturia, numbness, tingling, seizures,  Focal weakness, Loss of consciousness,  Tremor, insomnia, depression, anxiety, and suicidal ideation.      Objective:  BP 100/64 (BP Location: Left Arm, Patient Position: Sitting, Cuff Size: Normal)   Pulse 68   Temp 97.9 F (36.6 C) (Oral)   Ht '5\' 3"'$  (1.6 m)   Wt 104 lb 6.4 oz (47.4 kg)   SpO2 97%   BMI 18.49 kg/m   BP Readings from Last 3 Encounters:  03/20/22 100/64  02/20/22 (!) 100/54  08/21/21 130/70    Wt Readings from Last 3 Encounters:  03/20/22 104 lb 6.4 oz (47.4 kg)  02/19/22 111 lb 15.9 oz (50.8 kg)  08/21/21 109 lb 3.2 oz (49.5 kg)    General appearance: alert, cooperative and appears stated age Ears: normal TM's and external ear canals both ears Throat: lips, mucosa, and tongue normal; teeth and gums normal Neck: no adenopathy, no carotid bruit, supple, symmetrical, trachea midline and thyroid not enlarged, symmetric, no tenderness/mass/nodules Back: symmetric, no curvature. ROM normal. No CVA tenderness. Lungs: clear to auscultation bilaterally Heart: regular rate and rhythm, S1, S2 normal, no murmur, click, rub or  gallop Abdomen: soft, non-tender; bowel sounds normal; no masses,  no organomegaly Pulses: 2+ and symmetric Skin: Skin color, texture, turgor normal. No rashes or lesions Lymph nodes: Cervical, supraclavicular, and axillary nodes normal.  Lab Results  Component Value Date   HGBA1C 6.0 08/21/2021   HGBA1C 5.7 05/21/2016    Lab Results  Component Value Date   CREATININE 0.64 03/20/2022   CREATININE 0.67 02/20/2022   CREATININE 0.90 02/19/2022    Lab Results  Component Value Date   WBC 4.4 03/20/2022   HGB 11.9 (L) 03/20/2022   HCT 35.6 (L) 03/20/2022   PLT 257.0 03/20/2022   GLUCOSE 84 03/20/2022   CHOL 210 (H) 08/21/2021   TRIG 58.0 08/21/2021   HDL 65.50 08/21/2021   LDLCALC 133 (H) 08/21/2021   ALT 8 03/20/2022   AST 17 03/20/2022   NA 137 03/20/2022   K 4.0 03/20/2022   CL 101 03/20/2022   CREATININE 0.64 03/20/2022   BUN 11 03/20/2022   CO2 28 03/20/2022   TSH 1.54 07/13/2020   HGBA1C 6.0 08/21/2021    CT ABDOMEN PELVIS W CONTRAST  Result Date: 03/14/2022 CLINICAL DATA:  began second round of antibiotics today for ongoing diverticulitis. LLQ pain EXAM: CT ABDOMEN AND PELVIS WITH CONTRAST TECHNIQUE: Multidetector CT imaging of the abdomen and pelvis was performed using the standard protocol following bolus administration of intravenous contrast. RADIATION DOSE REDUCTION: This exam was performed according to the departmental dose-optimization program which includes automated exposure control, adjustment of the mA and/or kV according to patient size and/or use of iterative reconstruction technique. CONTRAST:  53m OMNIPAQUE IOHEXOL 300 MG/ML  SOLN COMPARISON:  CT AP, 02/19/2022 and 12/04/2017. FINDINGS: Lower chest: No acute abnormality. Hepatobiliary: No focal liver abnormality is seen. No gallstones, gallbladder wall thickening, or biliary dilatation. Pancreas: No pancreatic ductal dilatation or surrounding inflammatory changes. Spleen: Normal in size. Intraparenchymal  curvilinear calcification, likely partially-calcified sub-1 cm splenic artery branch aneurysm. Adrenals/Urinary Tract: Adrenal glands are unremarkable. 1.1 cm hypodense LEFT superior renal pole renal cortical hypodense lesion, incompletely characterized. Kidneys are otherwise normal, without renal calculi or hydronephrosis. Bladder is unremarkable. Stomach/Bowel: Stomach is within normal limits. Appendix is not definitively visualized. Severe distal descending colon and sigmoid diverticulosis. Nonobstructed small bowel. Nondilated colon. Vascular/Lymphatic: Aortic atherosclerosis. No enlarged abdominal or pelvic lymph nodes. Reproductive: Status post hysterectomy. No adnexal mass. Other: No abdominal wall hernia. No abdominopelvic ascites. Progression of previously-noted air-fluid collection within the LEFT lower quadrant, now multiloculated and with thickened enhancing wall, measuring up to 5.5 x 4.5 x 4.0 cm (AP by transaxial by CC). Previously up to 3.3 x 3.1 cm. Musculoskeletal: Multilevel degenerative changes of the spine including S-shaped scoliosis. No acute osseous findings. No follow-up is indicated for incidental findings, unless specifically mentioned. IMPRESSION: Since CT AP dated 02/19/2022; 1. Progression of perforated, contained sigmoid diverticulitis with enlargement of a now multiloculated abscess, measuring 5.5 cm (previously 3.3 cm). 2. Aortic  Atherosclerosis (ICD10-I70.0). Additional incidental, chronic and senescent findings as above. These results were conveyed in person at the time of interpretation on 03/14/2022 at 3:06 pm to provider [facility] CINTRON-DIAZ , who verbally acknowledged these results. Electronically Signed   By: Michaelle Birks M.D.   On: 03/14/2022 15:25    Assessment & Plan:   Problem List Items Addressed This Visit     Abdominal aortic atherosclerosis ([place])    Reviewed findings of prior CT scan today..  Patient is willing to  Initiate statin therapy starting with 20 mg  crestor twice weekly        Relevant Medications   atorvastatin (LIPITOR) 20 MG tablet (Start on 03/21/2022)   Diverticulitis    With abscess formation .  She is currently taking a 2nd round of antibiotics and following up with Gen Surg to determine if open surgery is nNeeded .  Probiotic advised . She has no leukocytosis by repeat CBC today    Lab Results  Component Value Date   WBC 4.4 03/20/2022   HGB 11.9 (L) 03/20/2022   HCT 35.6 (L) 03/20/2022   MCV 86.6 03/20/2022   PLT 257.0 03/20/2022   Lab Results  Component Value Date   ALT 8 03/20/2022   AST 17 03/20/2022   ALKPHOS 57 03/20/2022   BILITOT 0.3 03/20/2022         Normocytic anemia    Noted after hydration during recent hospitalization. Normal iron, b12 and folate  studies   Lab Results  Component Value Date   WBC 4.4 03/20/2022   HGB 11.9 (L) 03/20/2022   HCT 35.6 (L) 03/20/2022   MCV 86.6 03/20/2022   PLT 257.0 03/20/2022   Lab Results  Component Value Date   IRON 76 03/20/2022   TIBC 278.6 03/20/2022   FERRITIN 175.1 03/20/2022   Last vitamin B12 and Folate Lab Results  Component Value Date   VITAMINB12 501 03/20/2022   FOLATE 9.4 03/20/2022         Myalgia due to statin   Other Visit Diagnoses     Anemia, unspecified type    -  Primary   Relevant Orders   CBC with Differential/Platelet (Completed)   IBC + Ferritin (Completed)   Comprehensive metabolic panel (Completed)   B12 and Folate Panel (Completed)   Vitamin D deficiency       Relevant Orders   VITAMIN D 25 Hydroxy (Vit-D Deficiency, Fractures) (Completed)       I spent a total of 42  minutes with this patient in a face to face visit on the date of this encounter reviewing the last office visit with me , her most recent  hospitalization and general surgery consult ,  most recent imaging study ,   and post visit ordering of testing and therapeutics.    Follow-up: Return in about 3 months (around 06/20/2022).   Crecencio Mc,  MD

## 2022-03-20 NOTE — Telephone Encounter (Signed)
Pt would like to have fluconazole sent in since she has been on antibiotics for a while now.

## 2022-03-20 NOTE — Assessment & Plan Note (Signed)
Noted after hydration during recent hospitalization. Normal iron, b12 and folate  studies   Lab Results  Component Value Date   WBC 4.4 03/20/2022   HGB 11.9 (L) 03/20/2022   HCT 35.6 (L) 03/20/2022   MCV 86.6 03/20/2022   PLT 257.0 03/20/2022   Lab Results  Component Value Date   IRON 76 03/20/2022   TIBC 278.6 03/20/2022   FERRITIN 175.1 03/20/2022   Last vitamin B12 and Folate Lab Results  Component Value Date   VITAMINB12 501 03/20/2022   FOLATE 9.4 03/20/2022

## 2022-03-21 NOTE — Telephone Encounter (Signed)
Pt is aware that medication has been sent to [facility].

## 2022-03-22 ENCOUNTER — Other Ambulatory Visit: Payer: Self-pay | Admitting: General Surgery

## 2022-03-22 DIAGNOSIS — K5720 Diverticulitis of large intestine with perforation and abscess without bleeding: Secondary | ICD-10-CM

## 2022-03-25 ENCOUNTER — Ambulatory Visit
Admission: RE | Admit: 2022-03-25 | Discharge: 2022-03-25 | Disposition: A | Discharge status: Home / Self Care | Payer: 59 | Source: Ambulatory Visit | Attending: General Surgery | Admitting: General Surgery

## 2022-03-25 ENCOUNTER — Ambulatory Visit: Payer: 59 | Admitting: Internal Medicine

## 2022-03-25 DIAGNOSIS — I700 Atherosclerosis of aorta: Secondary | ICD-10-CM | POA: Diagnosis not present

## 2022-03-25 DIAGNOSIS — K5720 Diverticulitis of large intestine with perforation and abscess without bleeding: Secondary | ICD-10-CM | POA: Diagnosis not present

## 2022-03-25 MED ORDER — IOHEXOL 300 MG/ML  SOLN
75.0000 mL | Freq: Once | INTRAMUSCULAR | Status: AC | PRN
  Administered 2022-03-25: 75 mL via INTRAVENOUS

## 2022-04-01 ENCOUNTER — Other Ambulatory Visit: Payer: Self-pay

## 2022-04-30 ENCOUNTER — Other Ambulatory Visit: Payer: Self-pay

## 2022-05-03 ENCOUNTER — Other Ambulatory Visit: Payer: Self-pay

## 2022-05-20 ENCOUNTER — Other Ambulatory Visit: Payer: Self-pay

## 2022-05-20 MED ORDER — CIPROFLOXACIN HCL 500 MG PO TABS
500.0000 mg | ORAL_TABLET | Freq: Two times a day (BID) | ORAL | 0 refills | Status: DC
  Filled 2022-05-20: qty 10, 5d supply, fill #0

## 2022-05-24 ENCOUNTER — Encounter: Payer: Self-pay | Admitting: Gastroenterology

## 2022-05-24 ENCOUNTER — Ambulatory Visit
Admission: RE | Admit: 2022-05-24 | Discharge: 2022-05-24 | Disposition: A | Discharge status: Home / Self Care | Payer: 59 | Attending: Gastroenterology | Admitting: Gastroenterology

## 2022-05-24 ENCOUNTER — Encounter
Admission: RE | Disposition: A | Discharge status: Home / Self Care | Payer: Self-pay | Source: Home / Self Care | Attending: Gastroenterology

## 2022-05-24 ENCOUNTER — Ambulatory Visit: Payer: 59 | Admitting: Certified Registered Nurse Anesthetist

## 2022-05-24 ENCOUNTER — Other Ambulatory Visit: Payer: Self-pay

## 2022-05-24 DIAGNOSIS — K5730 Diverticulosis of large intestine without perforation or abscess without bleeding: Secondary | ICD-10-CM | POA: Diagnosis not present

## 2022-05-24 DIAGNOSIS — Z09 Encounter for follow-up examination after completed treatment for conditions other than malignant neoplasm: Secondary | ICD-10-CM | POA: Insufficient documentation

## 2022-05-24 DIAGNOSIS — K5732 Diverticulitis of large intestine without perforation or abscess without bleeding: Secondary | ICD-10-CM | POA: Insufficient documentation

## 2022-05-24 DIAGNOSIS — K5792 Diverticulitis of intestine, part unspecified, without perforation or abscess without bleeding: Secondary | ICD-10-CM | POA: Diagnosis not present

## 2022-05-24 DIAGNOSIS — D126 Benign neoplasm of colon, unspecified: Secondary | ICD-10-CM | POA: Diagnosis not present

## 2022-05-24 DIAGNOSIS — K635 Polyp of colon: Secondary | ICD-10-CM | POA: Insufficient documentation

## 2022-05-24 HISTORY — PX: COLONOSCOPY WITH PROPOFOL: SHX5780

## 2022-05-24 HISTORY — DX: Noninfective gastroenteritis and colitis, unspecified: K52.9

## 2022-05-24 SURGERY — COLONOSCOPY WITH PROPOFOL
Anesthesia: General

## 2022-05-24 MED ORDER — PROPOFOL 10 MG/ML IV BOLUS
INTRAVENOUS | Status: DC | PRN
  Administered 2022-05-24: 20 mg via INTRAVENOUS
  Administered 2022-05-24: 60 mg via INTRAVENOUS

## 2022-05-24 MED ORDER — LIDOCAINE HCL (CARDIAC) PF 100 MG/5ML IV SOSY
PREFILLED_SYRINGE | INTRAVENOUS | Status: DC | PRN
  Administered 2022-05-24: 50 mg via INTRAVENOUS

## 2022-05-24 MED ORDER — PROPOFOL 500 MG/50ML IV EMUL
INTRAVENOUS | Status: DC | PRN
  Administered 2022-05-24: 100 ug/kg/min via INTRAVENOUS

## 2022-05-24 MED ORDER — SODIUM CHLORIDE 0.9 % IV SOLN: INTRAVENOUS | Status: DC

## 2022-05-24 NOTE — Op Note (Signed)
[facility] Patient Name: Jean Brooks Procedure Date: 05/24/2022 10:07 AM MRN: [id number] Account #: [id number] Date of Birth: 01-16-1946 Admit Type: Outpatient Age: 76 Room: [facility] ENDO ROOM 4 Gender: Female Note Status: Finalized Instrument Name: Peds Colonoscope [id number] Procedure:             Colonoscopy Indications:           Follow-up of diverticulitis Providers:             Jonathon Bellows MD, MD Referring MD:          Deborra Medina, MD (Referring MD) Medicines:             Monitored Anesthesia Care Complications:         No immediate complications. Procedure:             Pre-Anesthesia Assessment:                        - Prior to the procedure, a History and Physical was                         performed, and patient medications, allergies and                         sensitivities were reviewed. The patient's tolerance                         of previous anesthesia was reviewed.                        - The risks and benefits of the procedure and the                         sedation options and risks were discussed with the                         patient. All questions were answered and informed                         consent was obtained.                        - ASA Grade Assessment: II - A patient with mild                         systemic disease.                        After obtaining informed consent, the colonoscope was                         passed under direct vision. Throughout the procedure,                         the patient's blood pressure, pulse, and oxygen                         saturations were monitored continuously. The                         Colonoscope was introduced through  the anus and                         advanced to the the cecum, identified by the                         appendiceal orifice. The colonoscopy was performed                         with ease. The patient tolerated the procedure well.                          The quality of the bowel preparation was adequate. Findings:      The perianal and digital rectal examinations were normal.      Multiple small and large-mouthed diverticula were found in the sigmoid       colon.      A 5 mm polyp was found in the ascending colon. The polyp was sessile.       The polyp was removed with a cold snare. Resection and retrieval were       complete.      The exam was otherwise without abnormality on direct and retroflexion       views. Impression:            - Diverticulosis in the sigmoid colon.                        - One 5 mm polyp in the ascending colon, removed with                         a cold snare. Resected and retrieved.                        - The examination was otherwise normal on direct and                         retroflexion views. Recommendation:        - Discharge patient to home (with escort).                        - Resume previous diet.                        - Continue present medications.                        - Await pathology results.                        - Repeat colonoscopy is not recommended due to current                         age (33 years or older) for surveillance. Procedure Code(s):     --- Professional ---                        [phone], Colonoscopy, flexible; with removal of                         tumor(s), polyp(s), or other lesion(s) by snare  technique Diagnosis Code(s):     --- Professional ---                        K63.5, Polyp of colon                        K57.32, Diverticulitis of large intestine without                         perforation or abscess without bleeding                        K57.30, Diverticulosis of large intestine without                         perforation or abscess without bleeding CPT copyright 2019 American Medical Association. All rights reserved. The codes documented in this report are preliminary and upon coder review may  be revised to meet  current compliance requirements. Jonathon Bellows, MD Jonathon Bellows MD, MD 05/24/2022 10:44:11 AM This report has been signed electronically. Number of Addenda: 0 Note Initiated On: 05/24/2022 10:07 AM Scope Withdrawal Time: 0 hours 9 minutes 38 seconds  Total Procedure Duration: 0 hours 21 minutes 5 seconds  Estimated Blood Loss:  Estimated blood loss: none.      [facility]

## 2022-05-24 NOTE — Transfer of Care (Signed)
Immediate Anesthesia Transfer of Care Note  Patient: Jean Brooks  Procedure(s) Performed: COLONOSCOPY WITH PROPOFOL  Patient Location: PACU  Anesthesia Type:General  Level of Consciousness: sedated  Airway & Oxygen Therapy: Patient Spontanous Breathing  Post-op Assessment: Report given to RN and Post -op Vital signs reviewed and stable  Post vital signs: Reviewed and stable  Last Vitals:  Vitals Value Taken Time  BP    Temp    Pulse    Resp    SpO2      Last Pain:  Vitals:   05/24/22 1043  TempSrc:   PainSc: Asleep         Complications: No notable events documented.

## 2022-05-24 NOTE — Anesthesia Procedure Notes (Signed)
Date/Time: 05/24/2022 10:12 AM  Performed by: Johnna Acosta, CRNAPre-anesthesia Checklist: Patient identified, Emergency Drugs available, Suction available, Patient being monitored and Timeout performed Patient Re-evaluated:Patient Re-evaluated prior to induction Oxygen Delivery Method: Nasal cannula Preoxygenation: Pre-oxygenation with 100% oxygen Induction Type: IV induction

## 2022-05-24 NOTE — Anesthesia Preprocedure Evaluation (Signed)
Anesthesia Evaluation  Patient identified by MRN, date of birth, ID band Patient awake    Reviewed: Allergy & Precautions, NPO status , Patient's Chart, lab work & pertinent test results  History of Anesthesia Complications (+) PONV and history of anesthetic complications  Airway Mallampati: II  TM Distance: <3 FB     Dental  (+) Dental Advidsory Given, Teeth Intact   Pulmonary neg pulmonary ROS,    Pulmonary exam normal        Cardiovascular (-) angina(-) CAD, (-) Past MI and (-) Cardiac Stents Normal cardiovascular exam(-) dysrhythmias + Valvular Problems/Murmurs      Neuro/Psych  Headaches, neg Seizures negative psych ROS   GI/Hepatic Neg liver ROS, GERD  ,diverticulosis   Endo/Other  negative endocrine ROS  Renal/GU negative Renal ROS  negative genitourinary   Musculoskeletal  (+) Arthritis ,   Abdominal Normal abdominal exam  (+)   Peds negative pediatric ROS (+)  Hematology negative hematology ROS (+)   Anesthesia Other Findings Past Medical History: No date: Arthritis     Comment:  bil hands/thumbs 2010: Chest pain, non-cardiac     Comment:  a. stress echo 2010: nl treadmill EKG w/o evidence of               ischemia or arrhythmia, good exercise tolerance for age,               normal stress echo images w/o evidence of myocardial               ischemia; b. baseline echo with EF 55% peak stress showed              nl LV systolic function wtih EF 65% w/o evidence of HK No date: Chronic colitis No date: Complication of anesthesia No date: Coronary artery disease 12/29/2011: Cyst, ovarian     Comment:  Serous cystadenomas, bilateral.  S/pBSO May 2019 ([place])  04/2008: Diverticulitis of colon with hemorrhage     Comment:  Hospitalized [facility] No date: Family history of adverse reaction to anesthesia     Comment:  mom and maternal grandmother-agitated No date: H/O syncope     Comment:  a. echo 2010: EF 60%,  LV nl size, RV nl size & fxn,               atria nl size, aorta nl, no pericardial effusion, aortic               valve nl, mitral valve nl w/ mild insufficiency,               tricuspid valve nl w/ mild insufficiency, pulmonic valve               nl No date: Heart murmur     Comment:  asymptomatic-in pt's 20's No date: Migraine headache     Comment:  h/o migraines No date: PONV (postoperative nausea and vomiting) No date: Scoliosis of thoracic spine No date: Shingles No date: Syncope and collapse   Reproductive/Obstetrics                             Anesthesia Physical  Anesthesia Plan  ASA: 2  Anesthesia Plan: General   Post-op Pain Management:    Induction: Intravenous  PONV Risk Score and Plan: 4 or greater and Propofol infusion and TIVA  Airway Management Planned: Natural Airway and Nasal Cannula  Additional Equipment:   Intra-op Plan:  Post-operative Plan:   Informed Consent: I have reviewed the patients History and Physical, chart, labs and discussed the procedure including the risks, benefits and alternatives for the proposed anesthesia with the patient or authorized representative who has indicated his/her understanding and acceptance.     Dental advisory given  Plan Discussed with: CRNA and Surgeon  Anesthesia Plan Comments:         Anesthesia Quick Evaluation

## 2022-05-24 NOTE — H&P (Signed)
Jonathon Bellows, MD [address], [place], [place], [place], [address] [id number], [place], [place], [place], [address] Phone: [phone]  Fax: [phone]  Primary Care Physician:  Crecencio Mc, MD   Pre-Procedure History & Physical: HPI:  Jean Brooks is a 76 y.o. female is here for an colonoscopy.   Past Medical History:  Diagnosis Date   Arthritis    bil hands/thumbs   Chest pain, non-cardiac 2010   a. stress echo 2010: nl treadmill EKG w/o evidence of ischemia or arrhythmia, good exercise tolerance for age, normal stress echo images w/o evidence of myocardial ischemia; b. baseline echo with EF 55% peak stress showed nl LV systolic function wtih EF 65% w/o evidence of HK   Chronic colitis    Complication of anesthesia    Coronary artery disease    Cyst, ovarian 12/29/2011   Serous cystadenomas, bilateral.  S/pBSO May 2019 ([facility])    Diverticulitis of colon with hemorrhage 04/2008   Hospitalized [facility]   Family history of adverse reaction to anesthesia    mom and maternal grandmother-agitated   H/O syncope    a. echo 2010: EF 60%, LV nl size, RV nl size & fxn, atria nl size, aorta nl, no pericardial effusion, aortic valve nl, mitral valve nl w/ mild insufficiency, tricuspid valve nl w/ mild insufficiency, pulmonic valve nl   Heart murmur    asymptomatic-in pt's 20's   Migraine headache    h/o migraines   PONV (postoperative nausea and vomiting)    Scoliosis of thoracic spine    Shingles    Syncope and collapse     Past Surgical History:  Procedure Laterality Date   ABDOMINAL HYSTERECTOMY  1991   APPENDECTOMY  1963   COLONOSCOPY     ESOPHAGOGASTRODUODENOSCOPY     LAPAROSCOPIC BILATERAL SALPINGO OOPHERECTOMY Bilateral 02/20/2018   Procedure: LAPAROSCOPIC BILATERAL SALPINGO OOPHORECTOMY;  Surgeon: Ward, Honor Loh, MD;  Location: [facility] ORS;  Service: Gynecology;  Laterality: Bilateral;   LAPAROSCOPIC LYSIS OF ADHESIONS  02/20/2018   Procedure: LAPAROSCOPIC  LYSIS OF ADHESIONS;  Surgeon: Ward, Honor Loh, MD;  Location: [facility] ORS;  Service: Gynecology;;   TONSILLECTOMY     age 78 or 6    Prior to Admission medications   Medication Sig Start Date End Date Taking? Authorizing Provider  acetaminophen (TYLENOL) 325 MG tablet Take 2 tablets (650 mg total) by mouth every 6 (six) hours as needed for mild pain (or Fever >/= 101). 02/20/22  Yes Emeterio Reeve, DO  atorvastatin (LIPITOR) 20 MG tablet Take 1 tablet (20 mg total) by mouth 2 (two) times a week. 03/21/22  Yes Crecencio Mc, MD  Cholecalciferol (VITAMIN D3) 1000 UNITS CAPS Take 1 capsule by mouth every other day. Alternate with the magnesium for leg pain   Yes [provider]  ciprofloxacin (CIPRO) 500 MG tablet Take 1 tablet (500 mg total) by mouth 2 (two) times daily. 05/20/22  Yes Jonathon Bellows, MD  diphenhydrAMINE (BENADRYL) 25 MG tablet Take 25 mg by mouth daily.   Yes [provider]  fluticasone (FLONASE) 50 MCG/ACT nasal spray Place 2 sprays into both nostrils daily as needed for allergies or rhinitis. 01/17/22  Yes McLean-Scocuzza, Nino Glow, MD  ondansetron (ZOFRAN-ODT) 4 MG disintegrating tablet Take 1 tablet (4 mg total) by mouth every 8 (eight) hours as needed for nausea or vomiting. 03/20/22  Yes Crecencio Mc, MD  sodium chloride (MURO 128) 5 % ophthalmic ointment Place 1 application  into the right eye at bedtime.   Yes [provider]  zolpidem (AMBIEN CR) 12.5 MG CR tablet Take 1 tablet (12.5 mg total) by mouth at bedtime as needed. 03/04/22 08/31/22 Yes Crecencio Mc, MD  amoxicillin-clavulanate (AUGMENTIN) 500-125 MG tablet Take 1 tablet (500 mg total) by mouth 3 (three) times daily for 10 days 03/14/22     fluconazole (DIFLUCAN) 150 MG tablet Take 1 tablet (150 mg total) by mouth daily. 03/20/22   Crecencio Mc, MD    Allergies as of 02/27/2022 - Review Complete 02/19/2022  Allergen Reaction Noted   Sulfa antibiotics Hives 10/06/2012   Flagyl  [metronidazole] Other (See Comments) 04/25/2014   Aspirin Rash 02/13/2018   Azithromycin Rash 12/25/2011   Ciprofloxacin Nausea Only and Rash 04/25/2014   Erythromycin Rash 04/25/2014   Ibuprofen Rash 12/25/2011   Nitrofurantoin Rash 07/05/2014   Penicillins Rash 12/25/2011    Family History  Problem Relation Age of Onset   COPD Mother 39       respiratory failure/fibrosis, seizures   Stroke Mother    Hypertension Mother    Supraventricular tachycardia Mother    COPD Father 8   Mental illness Father        Parkinson's Dementia   Aneurysm Father 30   Stroke Son 38       hypertensive, left brain    Heart attack Maternal Grandfather 68       passed   Stroke Maternal Grandmother    Breast cancer Maternal Aunt     Social History   Socioeconomic History   Marital status: Married    Spouse name: Not on file   Number of children: Not on file   Years of education: Not on file   Highest education level: Not on file  Occupational History   Not on file  Tobacco Use   Smoking status: Never   Smokeless tobacco: Never  Vaping Use   Vaping Use: Never used  Substance and Sexual Activity   Alcohol use: No   Drug use: No   Sexual activity: Not on file  Other Topics Concern   Not on file  Social History Narrative   Not on file   Social Determinants of Health   Financial Resource Strain: Not on file  Food Insecurity: Not on file  Transportation Needs: Not on file  Physical Activity: Not on file  Stress: Not on file  Social Connections: Not on file  Intimate Partner Violence: Not on file    Review of Systems: See HPI, otherwise negative ROS  Physical Exam: BP 135/66   Pulse 72   Temp (!) 96.8 F (36 C) (Temporal)   Resp 18   Ht '5\' 3"'$  (1.6 m)   Wt 48.1 kg   SpO2 100%   BMI 18.78 kg/m  General:   Alert,  pleasant and cooperative in NAD Head:  Normocephalic and atraumatic. Neck:  Supple; no masses or thyromegaly. Lungs:  Clear throughout to auscultation,  normal respiratory effort.    Heart:  +S1, +S2, Regular rate and rhythm, No edema. Abdomen:  Soft, nontender and nondistended. Normal bowel sounds, without guarding, and without rebound.   Neurologic:  Alert and  oriented x4;  grossly normal neurologically.  Impression/Plan: Jean Brooks is here for an colonoscopy to be performed for diverticulitis. Risks, benefits, limitations, and alternatives regarding  colonoscopy have been reviewed with the patient.  Questions have been answered.  All parties agreeable.   Jonathon Bellows, MD  05/24/2022, 10:07  AM

## 2022-05-24 NOTE — Anesthesia Postprocedure Evaluation (Signed)
Anesthesia Post Note  Patient: Jean Brooks  Procedure(s) Performed: COLONOSCOPY WITH PROPOFOL  Patient location during evaluation: Endoscopy Anesthesia Type: General Level of consciousness: awake and alert Pain management: pain level controlled Vital Signs Assessment: post-procedure vital signs reviewed and stable Respiratory status: spontaneous breathing, nonlabored ventilation, respiratory function stable and patient connected to nasal cannula oxygen Cardiovascular status: blood pressure returned to baseline and stable Postop Assessment: no apparent nausea or vomiting Anesthetic complications: no   No notable events documented.   Last Vitals:  Vitals:   05/24/22 1053 05/24/22 1103  BP: 122/75 (!) 141/75  Pulse:    Resp: 18 13  Temp:    SpO2: 97% 100%    Last Pain:  Vitals:   05/24/22 1103  TempSrc:   PainSc: 0-No pain                 Martha Clan

## 2022-05-27 ENCOUNTER — Other Ambulatory Visit: Payer: Self-pay

## 2022-05-27 ENCOUNTER — Encounter: Payer: Self-pay | Admitting: Gastroenterology

## 2022-05-27 ENCOUNTER — Telehealth: Payer: Self-pay

## 2022-05-27 DIAGNOSIS — B379 Candidiasis, unspecified: Secondary | ICD-10-CM

## 2022-05-27 LAB — SURGICAL PATHOLOGY

## 2022-05-27 MED ORDER — FLUCONAZOLE 150 MG PO TABS
150.0000 mg | ORAL_TABLET | Freq: Once | ORAL | 0 refills | Status: AC
  Filled 2022-05-27: qty 1, 1d supply, fill #0

## 2022-05-27 MED ORDER — FLUCONAZOLE 150 MG PO TABS
150.0000 mg | ORAL_TABLET | Freq: Once | ORAL | 0 refills | Status: DC
Start: 2022-05-27 — End: 2022-05-27

## 2022-05-27 NOTE — Addendum Note (Signed)
Addended by: Vanetta Mulders on: 05/27/2022 11:54 AM   Modules accepted: Orders

## 2022-05-27 NOTE — Telephone Encounter (Signed)
Patient contacted office to receive advise regarding itching after antibiotic use.  Please advise.  Thanks,  [place], [place]

## 2022-05-29 ENCOUNTER — Other Ambulatory Visit: Payer: Self-pay

## 2022-05-30 ENCOUNTER — Other Ambulatory Visit: Payer: Self-pay

## 2022-05-30 ENCOUNTER — Ambulatory Visit: Payer: Self-pay | Admitting: General Surgery

## 2022-05-30 DIAGNOSIS — K5720 Diverticulitis of large intestine with perforation and abscess without bleeding: Secondary | ICD-10-CM | POA: Diagnosis not present

## 2022-05-30 MED ORDER — NEOMYCIN SULFATE 500 MG PO TABS
ORAL_TABLET | ORAL | 0 refills | Status: DC
  Filled 2022-05-30: qty 6, 1d supply, fill #0

## 2022-06-07 ENCOUNTER — Encounter
Admission: RE | Admit: 2022-06-07 | Discharge: 2022-06-07 | Disposition: A | Discharge status: Home / Self Care | Payer: 59 | Source: Ambulatory Visit | Attending: General Surgery | Admitting: General Surgery

## 2022-06-07 ENCOUNTER — Other Ambulatory Visit: Payer: Self-pay

## 2022-06-07 DIAGNOSIS — K5792 Diverticulitis of intestine, part unspecified, without perforation or abscess without bleeding: Secondary | ICD-10-CM

## 2022-06-07 DIAGNOSIS — I471 Supraventricular tachycardia, unspecified: Secondary | ICD-10-CM

## 2022-06-07 DIAGNOSIS — I2510 Atherosclerotic heart disease of native coronary artery without angina pectoris: Secondary | ICD-10-CM

## 2022-06-07 DIAGNOSIS — Z01818 Encounter for other preprocedural examination: Secondary | ICD-10-CM

## 2022-06-07 HISTORY — DX: Venous insufficiency (chronic) (peripheral): I87.2

## 2022-06-07 HISTORY — DX: Bradycardia, unspecified: R00.1

## 2022-06-07 HISTORY — DX: Anemia, unspecified: D64.9

## 2022-06-07 HISTORY — DX: Supraventricular tachycardia: I47.1

## 2022-06-07 HISTORY — DX: Lymphedema, not elsewhere classified: I89.0

## 2022-06-07 HISTORY — DX: Supraventricular tachycardia, unspecified: I47.10

## 2022-06-07 HISTORY — DX: Atherosclerosis of aorta: I70.0

## 2022-06-07 HISTORY — DX: Gastro-esophageal reflux disease without esophagitis: K21.9

## 2022-06-07 NOTE — Patient Instructions (Signed)
Your procedure is scheduled on:06-18-22 Tuesday Report to the Registration Desk on the 1st floor of the [facility]. Then proceed to the 2nd floor Surgery Desk To find out your arrival time, please call [phone] between 1PM - 3PM on:06-17-22 Monday If your arrival time is 6:00 am, do not arrive prior to that time as the [facility] entrance doors do not open until 6:00 am.  REMEMBER: Instructions that are not followed completely may result in serious medical risk, up to and including death; or upon the discretion of your surgeon and anesthesiologist your surgery may need to be rescheduled.  Do not eat food OR drink any liquids after midnight the night before surgery.  No gum chewing, lozengers or hard candies.  Do NOT take any medication the day of surgery  Follow Bowel Prep as instructed by Dr. Darrol Poke office   One week prior to surgery: Stop Anti-inflammatories (NSAIDS) such as Advil, Aleve, Ibuprofen, Motrin, Naproxen, Naprosyn and Aspirin based products such as Excedrin, Goodys Powder, BC Powder.You may however, take Tylenol if needed for pain up until the day of surgery.  Stop ANY OVER THE COUNTER supplements 7 days prior to surgery (Vitamin D3 and Magnesium)  No Alcohol for 24 hours before or after surgery.  No Smoking including e-cigarettes for 24 hours prior to surgery.  No chewable tobacco products for at least 6 hours prior to surgery.  No nicotine patches on the day of surgery.  Do not use any "recreational" drugs for at least a week prior to your surgery.  Please be advised that the combination of cocaine and anesthesia may have negative outcomes, up to and including death. If you test positive for cocaine, your surgery will be cancelled.  On the morning of surgery brush your teeth with toothpaste and water, you may rinse your mouth with mouthwash if you wish. Do not swallow any toothpaste or mouthwash.  Use CHG Soap as directed on instruction sheet.  Do  not wear jewelry, make-up, hairpins, clips or nail polish.  Do not wear lotions, powders, or perfumes.   Do not shave body from the neck down 48 hours prior to surgery just in case you cut yourself which could leave a site for infection.  Also, freshly shaved skin may become irritated if using the CHG soap.  Contact lenses, hearing aids and dentures may not be worn into surgery.  Do not bring valuables to the hospital. [facility] is not responsible for any missing/lost belongings or valuables.   Notify your doctor if there is any change in your medical condition (cold, fever, infection).  Wear comfortable clothing (specific to your surgery type) to the hospital.  After surgery, you can help prevent lung complications by doing breathing exercises.  Take deep breaths and cough every 1-2 hours. Your doctor may order a device called an Incentive Spirometer to help you take deep breaths. When coughing or sneezing, hold a pillow firmly against your incision with both hands. This is called "splinting." Doing this helps protect your incision. It also decreases belly discomfort.  If you are being admitted to the hospital overnight, leave your suitcase in the car. After surgery it may be brought to your room.  If you are being discharged the day of surgery, you will not be allowed to drive home. You will need a responsible adult (18 years or older) to drive you home and stay with you that night.   If you are taking public transportation, you will need to have  a responsible adult (18 years or older) with you. Please confirm with your physician that it is acceptable to use public transportation.   Please call the [facility] Dept. at [phone] if you have any questions about these instructions.  Surgery Visitation Policy:  Patients undergoing a surgery or procedure may have two family members or support persons with them as long as the person is not COVID-19 positive or  experiencing its symptoms.   Inpatient Visitation:    Visiting hours are 7 a.m. to 8 p.m. Up to four visitors are allowed at one time in a patient room, including children. The visitors may rotate out with other people during the day. One designated support person (adult) may remain overnight.

## 2022-06-12 ENCOUNTER — Encounter
Admission: RE | Admit: 2022-06-12 | Discharge: 2022-06-12 | Disposition: A | Discharge status: Home / Self Care | Payer: 59 | Source: Ambulatory Visit | Attending: General Surgery | Admitting: General Surgery

## 2022-06-12 ENCOUNTER — Other Ambulatory Visit: Payer: Self-pay

## 2022-06-12 DIAGNOSIS — Z01818 Encounter for other preprocedural examination: Secondary | ICD-10-CM | POA: Diagnosis not present

## 2022-06-12 DIAGNOSIS — I2510 Atherosclerotic heart disease of native coronary artery without angina pectoris: Secondary | ICD-10-CM | POA: Diagnosis not present

## 2022-06-12 DIAGNOSIS — I471 Supraventricular tachycardia: Secondary | ICD-10-CM | POA: Insufficient documentation

## 2022-06-12 LAB — TYPE AND SCREEN
ABO/RH(D): A POS
Antibody Screen: NEGATIVE

## 2022-06-18 ENCOUNTER — Inpatient Hospital Stay: Payer: 59 | Admitting: Urgent Care

## 2022-06-18 ENCOUNTER — Other Ambulatory Visit: Payer: Self-pay

## 2022-06-18 ENCOUNTER — Inpatient Hospital Stay: Payer: 59 | Admitting: Certified Registered Nurse Anesthetist

## 2022-06-18 ENCOUNTER — Inpatient Hospital Stay
Admission: RE | Admit: 2022-06-18 | Discharge: 2022-06-19 | DRG: 331 | Disposition: A | Discharge status: Home / Self Care | Payer: 59 | Attending: General Surgery | Admitting: General Surgery

## 2022-06-18 ENCOUNTER — Encounter: Payer: Self-pay | Admitting: General Surgery

## 2022-06-18 ENCOUNTER — Encounter
Admission: RE | Disposition: A | Discharge status: Home / Self Care | Payer: Self-pay | Source: Home / Self Care | Attending: General Surgery

## 2022-06-18 DIAGNOSIS — K219 Gastro-esophageal reflux disease without esophagitis: Secondary | ICD-10-CM | POA: Diagnosis present

## 2022-06-18 DIAGNOSIS — Z823 Family history of stroke: Secondary | ICD-10-CM | POA: Diagnosis not present

## 2022-06-18 DIAGNOSIS — Z8719 Personal history of other diseases of the digestive system: Secondary | ICD-10-CM | POA: Diagnosis present

## 2022-06-18 DIAGNOSIS — K5720 Diverticulitis of large intestine with perforation and abscess without bleeding: Secondary | ICD-10-CM | POA: Diagnosis not present

## 2022-06-18 DIAGNOSIS — Z882 Allergy status to sulfonamides status: Secondary | ICD-10-CM | POA: Diagnosis not present

## 2022-06-18 DIAGNOSIS — K5792 Diverticulitis of intestine, part unspecified, without perforation or abscess without bleeding: Secondary | ICD-10-CM | POA: Diagnosis not present

## 2022-06-18 DIAGNOSIS — Z803 Family history of malignant neoplasm of breast: Secondary | ICD-10-CM

## 2022-06-18 DIAGNOSIS — Z886 Allergy status to analgesic agent status: Secondary | ICD-10-CM

## 2022-06-18 DIAGNOSIS — Z825 Family history of asthma and other chronic lower respiratory diseases: Secondary | ICD-10-CM

## 2022-06-18 DIAGNOSIS — Z90710 Acquired absence of both cervix and uterus: Secondary | ICD-10-CM | POA: Diagnosis not present

## 2022-06-18 DIAGNOSIS — I480 Paroxysmal atrial fibrillation: Secondary | ICD-10-CM | POA: Diagnosis not present

## 2022-06-18 DIAGNOSIS — Z8249 Family history of ischemic heart disease and other diseases of the circulatory system: Secondary | ICD-10-CM | POA: Diagnosis not present

## 2022-06-18 DIAGNOSIS — I2510 Atherosclerotic heart disease of native coronary artery without angina pectoris: Secondary | ICD-10-CM | POA: Diagnosis not present

## 2022-06-18 DIAGNOSIS — Z880 Allergy status to penicillin: Secondary | ICD-10-CM | POA: Diagnosis not present

## 2022-06-18 DIAGNOSIS — Z881 Allergy status to other antibiotic agents status: Secondary | ICD-10-CM | POA: Diagnosis not present

## 2022-06-18 DIAGNOSIS — K5732 Diverticulitis of large intestine without perforation or abscess without bleeding: Principal | ICD-10-CM | POA: Diagnosis present

## 2022-06-18 SURGERY — COLECTOMY, SIGMOID, ROBOT-ASSISTED
Anesthesia: General | Site: Ureter

## 2022-06-18 MED ORDER — ACETAMINOPHEN 500 MG PO TABS: 1000.0000 mg | ORAL_TABLET | ORAL | Status: AC

## 2022-06-18 MED ORDER — ALVIMOPAN 12 MG PO CAPS
12.0000 mg | ORAL_CAPSULE | Freq: Two times a day (BID) | ORAL | Status: DC
  Filled 2022-06-18: qty 1

## 2022-06-18 MED ORDER — PROPOFOL 1000 MG/100ML IV EMUL
INTRAVENOUS | Status: AC
  Filled 2022-06-18: qty 100

## 2022-06-18 MED ORDER — PROPOFOL 10 MG/ML IV BOLUS
INTRAVENOUS | Status: AC
  Filled 2022-06-18: qty 20

## 2022-06-18 MED ORDER — MIDAZOLAM HCL 2 MG/2ML IJ SOLN
INTRAMUSCULAR | Status: DC | PRN
  Administered 2022-06-18: 2 mg via INTRAVENOUS

## 2022-06-18 MED ORDER — VANCOMYCIN HCL IN DEXTROSE 1-5 GM/200ML-% IV SOLN
1000.0000 mg | INTRAVENOUS | Status: AC
  Administered 2022-06-18: 1000 mg via INTRAVENOUS

## 2022-06-18 MED ORDER — DEXAMETHASONE SODIUM PHOSPHATE 10 MG/ML IJ SOLN
INTRAMUSCULAR | Status: AC
  Filled 2022-06-18: qty 1

## 2022-06-18 MED ORDER — BUPIVACAINE LIPOSOME 1.3 % IJ SUSP
INTRAMUSCULAR | Status: AC
  Filled 2022-06-18: qty 20

## 2022-06-18 MED ORDER — INDOCYANINE GREEN 25 MG IV SOLR
INTRAVENOUS | Status: DC | PRN
  Administered 2022-06-18: 5 mg via INTRAVENOUS

## 2022-06-18 MED ORDER — PROPOFOL 10 MG/ML IV BOLUS
INTRAVENOUS | Status: DC | PRN
  Administered 2022-06-18: 120 mg via INTRAVENOUS

## 2022-06-18 MED ORDER — FENTANYL CITRATE (PF) 100 MCG/2ML IJ SOLN: 25.0000 ug | INTRAMUSCULAR | Status: DC | PRN

## 2022-06-18 MED ORDER — ROCURONIUM BROMIDE 100 MG/10ML IV SOLN
INTRAVENOUS | Status: DC | PRN
  Administered 2022-06-18: 30 mg via INTRAVENOUS
  Administered 2022-06-18: 40 mg via INTRAVENOUS
  Administered 2022-06-18: 30 mg via INTRAVENOUS
  Administered 2022-06-18: 10 mg via INTRAVENOUS

## 2022-06-18 MED ORDER — ONDANSETRON 4 MG PO TBDP: 4.0000 mg | ORAL_TABLET | Freq: Four times a day (QID) | ORAL | Status: DC | PRN

## 2022-06-18 MED ORDER — BUPIVACAINE-EPINEPHRINE 0.5% -1:200000 IJ SOLN
INTRAMUSCULAR | Status: DC | PRN
  Administered 2022-06-18: 50 mL via SURGICAL_CAVITY

## 2022-06-18 MED ORDER — OXYCODONE HCL 5 MG PO TABS: 5.0000 mg | ORAL_TABLET | Freq: Once | ORAL | Status: DC | PRN

## 2022-06-18 MED ORDER — FENTANYL CITRATE (PF) 100 MCG/2ML IJ SOLN
INTRAMUSCULAR | Status: AC
  Filled 2022-06-18: qty 2

## 2022-06-18 MED ORDER — ALVIMOPAN 12 MG PO CAPS: 12.0000 mg | ORAL_CAPSULE | ORAL | Status: AC

## 2022-06-18 MED ORDER — ONDANSETRON HCL 4 MG/2ML IJ SOLN: 4.0000 mg | Freq: Four times a day (QID) | INTRAMUSCULAR | Status: DC | PRN

## 2022-06-18 MED ORDER — LIDOCAINE HCL (PF) 2 % IJ SOLN
INTRAMUSCULAR | Status: AC
  Filled 2022-06-18: qty 5

## 2022-06-18 MED ORDER — OXYCODONE HCL 5 MG/5ML PO SOLN: 5.0000 mg | Freq: Once | ORAL | Status: DC | PRN

## 2022-06-18 MED ORDER — DEXAMETHASONE SODIUM PHOSPHATE 10 MG/ML IJ SOLN
INTRAMUSCULAR | Status: DC | PRN
  Administered 2022-06-18: 10 mg via INTRAVENOUS

## 2022-06-18 MED ORDER — PROPOFOL 500 MG/50ML IV EMUL
INTRAVENOUS | Status: DC | PRN
  Administered 2022-06-18: 100 ug/kg/min via INTRAVENOUS

## 2022-06-18 MED ORDER — FAMOTIDINE 20 MG PO TABS
ORAL_TABLET | ORAL | Status: AC
  Administered 2022-06-18: 20 mg via ORAL
  Filled 2022-06-18: qty 1

## 2022-06-18 MED ORDER — ONDANSETRON HCL 4 MG/2ML IJ SOLN: 4.0000 mg | Freq: Once | INTRAMUSCULAR | Status: DC | PRN

## 2022-06-18 MED ORDER — STERILE WATER FOR IRRIGATION IR SOLN
Status: DC | PRN
  Administered 2022-06-18: 1000 mL via INTRAVESICAL

## 2022-06-18 MED ORDER — FAMOTIDINE 20 MG PO TABS: 20.0000 mg | ORAL_TABLET | Freq: Once | ORAL | Status: AC

## 2022-06-18 MED ORDER — BUPIVACAINE LIPOSOME 1.3 % IJ SUSP: 20.0000 mL | Freq: Once | INTRAMUSCULAR | Status: DC

## 2022-06-18 MED ORDER — PHENYLEPHRINE HCL (PRESSORS) 10 MG/ML IV SOLN
INTRAVENOUS | Status: DC | PRN
  Administered 2022-06-18 (×3): 80 ug via INTRAVENOUS
  Administered 2022-06-18 (×2): 160 ug via INTRAVENOUS

## 2022-06-18 MED ORDER — BUPIVACAINE-EPINEPHRINE (PF) 0.5% -1:200000 IJ SOLN
INTRAMUSCULAR | Status: AC
  Filled 2022-06-18: qty 30

## 2022-06-18 MED ORDER — VANCOMYCIN HCL IN DEXTROSE 1-5 GM/200ML-% IV SOLN
INTRAVENOUS | Status: AC
  Filled 2022-06-18: qty 200

## 2022-06-18 MED ORDER — PHENYLEPHRINE 80 MCG/ML (10ML) SYRINGE FOR IV PUSH (FOR BLOOD PRESSURE SUPPORT)
PREFILLED_SYRINGE | INTRAVENOUS | Status: AC
  Filled 2022-06-18: qty 10

## 2022-06-18 MED ORDER — ACETAMINOPHEN 500 MG PO TABS
ORAL_TABLET | ORAL | Status: AC
  Administered 2022-06-18: 1000 mg via ORAL
  Filled 2022-06-18: qty 2

## 2022-06-18 MED ORDER — 0.9 % SODIUM CHLORIDE (POUR BTL) OPTIME
TOPICAL | Status: DC | PRN
  Administered 2022-06-18: 500 mL

## 2022-06-18 MED ORDER — SUGAMMADEX SODIUM 200 MG/2ML IV SOLN
INTRAVENOUS | Status: DC | PRN
  Administered 2022-06-18: 200 mg via INTRAVENOUS

## 2022-06-18 MED ORDER — LIDOCAINE HCL (CARDIAC) PF 100 MG/5ML IV SOSY
PREFILLED_SYRINGE | INTRAVENOUS | Status: DC | PRN
  Administered 2022-06-18: 50 mg via INTRAVENOUS

## 2022-06-18 MED ORDER — MORPHINE SULFATE (PF) 4 MG/ML IV SOLN: 4.0000 mg | INTRAVENOUS | Status: DC | PRN

## 2022-06-18 MED ORDER — ENOXAPARIN SODIUM 40 MG/0.4ML IJ SOSY
40.0000 mg | PREFILLED_SYRINGE | INTRAMUSCULAR | Status: DC
  Administered 2022-06-19: 40 mg via SUBCUTANEOUS
  Filled 2022-06-18: qty 0.4

## 2022-06-18 MED ORDER — LACTATED RINGERS IV SOLN: INTRAVENOUS | Status: DC

## 2022-06-18 MED ORDER — ALVIMOPAN 12 MG PO CAPS
ORAL_CAPSULE | ORAL | Status: AC
  Administered 2022-06-18: 12 mg via ORAL
  Filled 2022-06-18: qty 1

## 2022-06-18 MED ORDER — HYDROCODONE-ACETAMINOPHEN 5-325 MG PO TABS
1.0000 | ORAL_TABLET | ORAL | Status: DC | PRN
  Administered 2022-06-18: 1 via ORAL
  Filled 2022-06-18: qty 1

## 2022-06-18 MED ORDER — GABAPENTIN 300 MG PO CAPS: 300.0000 mg | ORAL_CAPSULE | ORAL | Status: AC

## 2022-06-18 MED ORDER — ONDANSETRON HCL 4 MG/2ML IJ SOLN
INTRAMUSCULAR | Status: DC | PRN
  Administered 2022-06-18: 4 mg via INTRAVENOUS

## 2022-06-18 MED ORDER — ROCURONIUM BROMIDE 10 MG/ML (PF) SYRINGE
PREFILLED_SYRINGE | INTRAVENOUS | Status: AC
  Filled 2022-06-18: qty 10

## 2022-06-18 MED ORDER — KETAMINE HCL 50 MG/5ML IJ SOSY
PREFILLED_SYRINGE | INTRAMUSCULAR | Status: AC
  Filled 2022-06-18: qty 5

## 2022-06-18 MED ORDER — KETAMINE HCL 10 MG/ML IJ SOLN
INTRAMUSCULAR | Status: DC | PRN
  Administered 2022-06-18: 20 mg via INTRAVENOUS

## 2022-06-18 MED ORDER — ORAL CARE MOUTH RINSE: 15.0000 mL | Freq: Once | OROMUCOSAL | Status: AC

## 2022-06-18 MED ORDER — ONDANSETRON HCL 4 MG/2ML IJ SOLN
INTRAMUSCULAR | Status: AC
  Filled 2022-06-18: qty 2

## 2022-06-18 MED ORDER — MIDAZOLAM HCL 2 MG/2ML IJ SOLN
INTRAMUSCULAR | Status: AC
  Filled 2022-06-18: qty 2

## 2022-06-18 MED ORDER — CHLORHEXIDINE GLUCONATE 0.12 % MT SOLN
OROMUCOSAL | Status: AC
  Administered 2022-06-18: 15 mL via OROMUCOSAL
  Filled 2022-06-18: qty 15

## 2022-06-18 MED ORDER — SODIUM CHLORIDE 0.9 % IV SOLN: INTRAVENOUS | Status: DC

## 2022-06-18 MED ORDER — SODIUM CHLORIDE 0.9 % IR SOLN
Status: DC | PRN
  Administered 2022-06-18: 20 mL via INTRAVESICAL

## 2022-06-18 MED ORDER — ACETAMINOPHEN 10 MG/ML IV SOLN: 1000.0000 mg | Freq: Once | INTRAVENOUS | Status: DC | PRN

## 2022-06-18 MED ORDER — CHLORHEXIDINE GLUCONATE 0.12 % MT SOLN: 15.0000 mL | Freq: Once | OROMUCOSAL | Status: AC

## 2022-06-18 MED ORDER — FENTANYL CITRATE (PF) 100 MCG/2ML IJ SOLN
INTRAMUSCULAR | Status: DC | PRN
  Administered 2022-06-18 (×4): 25 ug via INTRAVENOUS

## 2022-06-18 MED ORDER — GABAPENTIN 300 MG PO CAPS
ORAL_CAPSULE | ORAL | Status: AC
  Administered 2022-06-18: 300 mg via ORAL
  Filled 2022-06-18: qty 1

## 2022-06-18 SURGICAL SUPPLY — 115 items
BAG DRAIN SIEMENS DORNER NS (MISCELLANEOUS) ×3 IMPLANT
BAG URINE DRAIN 2000ML AR STRL (UROLOGICAL SUPPLIES) ×3 IMPLANT
BASIN KIT SINGLE STR (MISCELLANEOUS) ×3 IMPLANT
BLADE CLIPPER SURG (BLADE) ×3 IMPLANT
BLADE SURG SZ10 CARB STEEL (BLADE) ×3 IMPLANT
BLADE SURG SZ11 CARB STEEL (BLADE) ×3 IMPLANT
CANNULA REDUC XI 12-8 STAPL (CANNULA) ×3
CANNULA REDUCER 12-8 DVNC XI (CANNULA) ×3 IMPLANT
CATH FOL LX CONE TIP  8F (CATHETERS) ×3
CATH FOL LX CONE TIP 8F (CATHETERS) ×3 IMPLANT
CATH FOLEY 2WAY  5CC 16FR (CATHETERS) ×3
CATH URETL OPEN 5X70 (CATHETERS) IMPLANT
CATH URTH 16FR FL 2W BLN LF (CATHETERS) ×3 IMPLANT
COVER TIP SHEARS 8 DVNC (MISCELLANEOUS) ×3 IMPLANT
COVER TIP SHEARS 8MM DA VINCI (MISCELLANEOUS) ×3
DERMABOND ADVANCED .7 DNX12 (GAUZE/BANDAGES/DRESSINGS) ×1 IMPLANT
DRAPE ARM DVNC X/XI (DISPOSABLE) ×12 IMPLANT
DRAPE COLUMN DVNC XI (DISPOSABLE) ×3 IMPLANT
DRAPE DA VINCI XI ARM (DISPOSABLE) ×12
DRAPE DA VINCI XI COLUMN (DISPOSABLE) ×3
DRAPE LEGGINS SURG 28X43 STRL (DRAPES) ×3 IMPLANT
DRAPE UNDER BUTTOCK W/FLU (DRAPES) ×3 IMPLANT
DRSG OPSITE POSTOP 4X10 (GAUZE/BANDAGES/DRESSINGS) IMPLANT
DRSG OPSITE POSTOP 4X8 (GAUZE/BANDAGES/DRESSINGS) IMPLANT
ELECT BLADE 6.5 EXT (BLADE) IMPLANT
ELECT CAUTERY BLADE 6.4 (BLADE) ×3 IMPLANT
ELECT REM PT RETURN 9FT ADLT (ELECTROSURGICAL) ×3
ELECTRODE REM PT RTRN 9FT ADLT (ELECTROSURGICAL) ×3 IMPLANT
GAUZE 4X4 16PLY ~~LOC~~+RFID DBL (SPONGE) ×6 IMPLANT
GLOVE BIO SURGEON STRL SZ 6.5 (GLOVE) ×9 IMPLANT
GLOVE BIOGEL PI IND STRL 6.5 (GLOVE) ×9 IMPLANT
GLOVE SURG UNDER POLY LF SZ7.5 (GLOVE) ×3 IMPLANT
GOWN STRL REUS W/ TWL LRG LVL3 (GOWN DISPOSABLE) ×21 IMPLANT
GOWN STRL REUS W/TWL LRG LVL3 (GOWN DISPOSABLE) ×21
GRASPER SUT TROCAR 14GX15 (MISCELLANEOUS) ×1 IMPLANT
GUIDEWIRE GREEN .038 145CM (MISCELLANEOUS) IMPLANT
GUIDEWIRE STR DUAL SENSOR (WIRE) IMPLANT
HANDLE YANKAUER SUCT BULB TIP (MISCELLANEOUS) ×3 IMPLANT
IRRIGATION STRYKERFLOW (MISCELLANEOUS) ×1 IMPLANT
IRRIGATOR STRYKERFLOW (MISCELLANEOUS) ×3
IRRIGATOR SUCT 8 DISP DVNC XI (IRRIGATION / IRRIGATOR) IMPLANT
IRRIGATOR SUCTION 8MM XI DISP (IRRIGATION / IRRIGATOR)
IV NS 1000ML (IV SOLUTION)
IV NS 1000ML BAXH (IV SOLUTION) IMPLANT
KIT IMAGING PINPOINTPAQ (MISCELLANEOUS) ×3 IMPLANT
KIT PINK PAD W/HEAD ARE REST (MISCELLANEOUS) ×3
KIT PINK PAD W/HEAD ARM REST (MISCELLANEOUS) ×3 IMPLANT
LABEL OR SOLS (LABEL) IMPLANT
MANIFOLD NEPTUNE II (INSTRUMENTS) ×6 IMPLANT
NDL INSUFFLATION 14GA 120MM (NEEDLE) ×2 IMPLANT
NEEDLE HYPO 22GX1.5 SAFETY (NEEDLE) ×3 IMPLANT
NEEDLE INSUFFLATION 14GA 120MM (NEEDLE) ×3 IMPLANT
OBTURATOR OPTICAL STANDARD 8MM (TROCAR) ×3
OBTURATOR OPTICAL STND 8 DVNC (TROCAR) ×3
OBTURATOR OPTICALSTD 8 DVNC (TROCAR) ×3 IMPLANT
PACK COLON CLEAN CLOSURE (MISCELLANEOUS) ×3 IMPLANT
PACK CYSTO AR (MISCELLANEOUS) ×3 IMPLANT
PACK LAP CHOLECYSTECTOMY (MISCELLANEOUS) ×3 IMPLANT
PENCIL SMOKE EVACUATOR (MISCELLANEOUS) ×1 IMPLANT
PORT ACCESS TROCAR AIRSEAL 5 (TROCAR) ×3 IMPLANT
RELOAD STAPLE 45 3.5 BLU DVNC (STAPLE) IMPLANT
RELOAD STAPLE 60 3.5 BLU DVNC (STAPLE) IMPLANT
RELOAD STAPLER 3.5X45 BLU DVNC (STAPLE) IMPLANT
RELOAD STAPLER 3.5X60 BLU DVNC (STAPLE) ×6 IMPLANT
RETRACTOR RING XSMALL (MISCELLANEOUS) IMPLANT
RETRACTOR WOUND ALXS 18CM SML (MISCELLANEOUS) IMPLANT
RTRCTR WOUND ALEXIS 13CM XS SH (MISCELLANEOUS)
RTRCTR WOUND ALEXIS O 18CM SML (MISCELLANEOUS)
SEAL CANN UNIV 5-8 DVNC XI (MISCELLANEOUS) ×9 IMPLANT
SEAL XI 5MM-8MM UNIVERSAL (MISCELLANEOUS) ×9
SEALER VESSEL DA VINCI XI (MISCELLANEOUS) ×3
SEALER VESSEL EXT DVNC XI (MISCELLANEOUS) ×1 IMPLANT
SET CYSTO W/LG BORE CLAMP LF (SET/KITS/TRAYS/PACK) ×3 IMPLANT
SET TRI-LUMEN FLTR TB AIRSEAL (TUBING) ×3 IMPLANT
SOL PREP PVP 2OZ (MISCELLANEOUS) ×3
SOLUTION ELECTROLUBE (MISCELLANEOUS) ×3 IMPLANT
SOLUTION PREP PVP 2OZ (MISCELLANEOUS) ×3 IMPLANT
SPONGE T-LAP 18X18 ~~LOC~~+RFID (SPONGE) ×3 IMPLANT
SPONGE T-LAP 4X18 ~~LOC~~+RFID (SPONGE) ×3 IMPLANT
STAPLER 45 DA VINCI SURE FORM (STAPLE)
STAPLER 45 SUREFORM DVNC (STAPLE) IMPLANT
STAPLER 60 DA VINCI SURE FORM (STAPLE) ×3
STAPLER 60 SUREFORM DVNC (STAPLE) ×1 IMPLANT
STAPLER CANNULA SEAL DVNC XI (STAPLE) ×3 IMPLANT
STAPLER CANNULA SEAL XI (STAPLE) ×3
STAPLER CIRCULAR MANUAL XL 29 (STAPLE) ×1 IMPLANT
STAPLER RELOAD 3.5X45 BLU DVNC (STAPLE)
STAPLER RELOAD 3.5X45 BLUE (STAPLE)
STAPLER RELOAD 3.5X60 BLU DVNC (STAPLE) ×6
STAPLER RELOAD 3.5X60 BLUE (STAPLE) ×6
STAPLER RELOADABLE 65 2-0 SUT (MISCELLANEOUS) IMPLANT
STAPLER SYS INTERNAL RELOAD SS (MISCELLANEOUS) ×3 IMPLANT
SURGILUBE 2OZ TUBE FLIPTOP (MISCELLANEOUS) ×6 IMPLANT
SUT MNCRL 4-0 (SUTURE) ×9
SUT MNCRL 4-0 27XMFL (SUTURE) ×9
SUT PDS PLUS 0 (SUTURE) ×6
SUT PDS PLUS AB 0 CT-2 (SUTURE) ×6 IMPLANT
SUT SILK 0 SH 30 (SUTURE) ×6 IMPLANT
SUT SILK 3-0 (SUTURE) IMPLANT
SUT STRATAFIX 0 PDS+ CT-2 23 (SUTURE) ×3
SUT VIC AB 3-0 SH 27 (SUTURE) ×3
SUT VIC AB 3-0 SH 27X BRD (SUTURE) ×9 IMPLANT
SUT VICRYL 0 AB UR-6 (SUTURE) ×7 IMPLANT
SUT VLOC 90 6 CV-15 VIOLET (SUTURE) ×3 IMPLANT
SUTURE MNCRL 4-0 27XMF (SUTURE) ×7 IMPLANT
SUTURE STRATFX 0 PDS+ CT-2 23 (SUTURE) ×1 IMPLANT
SYR 10ML LL (SYRINGE) ×6 IMPLANT
SYR 30ML LL (SYRINGE) ×6 IMPLANT
SYS TROCAR 1.5-3 SLV ABD GEL (ENDOMECHANICALS) ×3
SYSTEM TROCR 1.5-3 SLV ABD GEL (ENDOMECHANICALS) ×3 IMPLANT
TAPE TRANSPORE STRL 2 31045 (GAUZE/BANDAGES/DRESSINGS) ×1 IMPLANT
TRAP FLUID SMOKE EVACUATOR (MISCELLANEOUS) ×6 IMPLANT
TRAY FOLEY MTR SLVR 16FR STAT (SET/KITS/TRAYS/PACK) ×3 IMPLANT
WATER STERILE IRR 3000ML UROMA (IV SOLUTION) ×3 IMPLANT
WATER STERILE IRR 500ML POUR (IV SOLUTION) ×6 IMPLANT

## 2022-06-18 NOTE — Op Note (Signed)
Preoperative diagnosis:  History of complicated diverticulitis   Postoperative diagnosis:  History of complicated diverticulitis   Procedure: Cystoscopy with bilateral ureteral catheterization Intraureteral injection of indocyanine green  Surgeon: Abbie Sons, MD  Anesthesia: General  Complications: None  Intraoperative findings:  Bladder mucosa normal in appearance without erythema, solid or papillary lesions.  UOs normal-appearing bilaterally  EBL: None  Specimens: None  Indication: Jean Brooks is a 75 y.o. female with diverticulitis scheduled for robotic assisted laparoscopic sigmoid resection.  Intraureteral injection of ICG bilaterally has been requested to aid in intraoperative ureteral localization. Informed consent has been obtained.  Description of procedure:  The patient was taken to the operating room and general anesthesia was induced.  The patient was placed in the dorsal lithotomy position, prepped and draped in the usual sterile fashion, and preoperative antibiotics were administered. A preoperative time-out was performed.   A 21 cystoscope was lubricated and passed per urethra.  Panendoscopy was performed with findings as described above.  A 8 French cone-tip catheter was placed through the cystoscope.  10 cc syringe of ICG was attached and air was expelled from the catheter.  The cone-tip was placed in the left UO and 10 cc was instilled.  An identical procedure was performed on contralateral side.  A 16 French Foley catheter was inserted with 10 cc of sterile water instilled in the balloon and placed to gravity drainage.  She was then prepped and draped for general surgery procedure which will be dictated by Dr. Windell Moment.   Abbie Sons, M.D.

## 2022-06-18 NOTE — Op Note (Signed)
Preoperative diagnosis: History of complicated diverticulitis  Postoperative diagnosis: History of complicated diverticulitis  Procedure: Robotic assisted laparoscopic sigmoid colectomy.   Anesthesia: GETA   Surgeon: Herbert Pun, MD  Assistant: Dr. Lysle Pearl    Wound Classification: Clean contaminated   Specimen: Sigmoid colon   Complications: None   Estimated Blood Loss: 10 mL   Indications: Patient is a 76 y.o. female with history of complicated diverticulitis with abscess. Elective resection was indicated.     FIndings: 1.  Sigmoid colon with chronic diverticulitis 2.  Adequate hemostasis.  3.  Tension free anastomosis with adequate ICG perfusion.    Description of procedure: The patient was placed on the operating table in the lithotomy position, both arms tucked. General anesthesia was induced. A time-out was completed verifying correct patient, procedure, site, positioning, and implant(s) and/or special equipment prior to beginning this procedure. The abdomen was prepped and draped in the usual sterile fashion.    A Veress needle was inserted on Palmer's point.  Abdominal cavity was insufflated to 15 mmHg. Patient tolerated insufflation well.  An 8 mm port was inserted in an Optiview fashion in the right upper quadrant.  Two additional 8 mm ports and one 12 mm port were inserted under direct visualization along the right side of the abdominal wall. 5 mm assistant port was then placed on the right subcostal area.  No injuries from trocar placements were noted. The table was placed in the Trendelenburg position.  Xi robotic platform was then brought to the operative field and docked at an angle from the left lower quadrant.  Tip up grasper and fenestrated bipolar were placed in the left arm ports.  Scissors were placed in right arm port.   The sigmoid and descending colon was mobilized from the lateral attachment following the [employer]. This was followed up to the  splenic flexure. The splenic flexure was taken down and mobilized to let the distal end of the descending colon reach the rectum without tension. When mobilized, a small window on the mesentery of the distal descending colon where healthy colon was identified was done. Then the mesentery was divided with Vessel Sealer device. The mesentery was divided down to the rectosigmoid junction. At this point the rectosigmoid junction was divided with stapler device. 2.5 mg of ICG green was flushed intravenously and the site of adequate vascularity was identified. A colotomy was done distal to the proximal point of transection. An anvil was inserted through the colotomy and advanced proximally. The descending colon was transected with stapler device. The anvil was pulled through the staple line. Once the descending colon was identified reaching the rectum without tension, the assistant surgeon introduced the 29 mm EEA rectally. It was guided under direct vision up to the level of the distal staple line on the rectal stump. The spike of the EEA device was then deployed to pierce the rectal stump, the anvil was then attached to this spike, and the EEA device is closed and fired to perform a stapled end-to-end anastomosis. The doughnuts produced by the EEA stapler were checked to ensure that they were complete. Furthermore, an air leak test was carried out by insufflating air gently into the rectum, while the anastomosis was bathed underwater. A clamp was placed on the proximal colon to prevent its distention. Once the anastomosis was properly tested, the patient was repositioned back in the normal anatomical position. The specimen was removed through the pfannenstiel incision. The trocars were removed under direct vision and  the fascia of the pfannenstiel incision was closed with a #0 Stratafix suture. The skin was closed with 4-0 Monocryl sutures in a running fashion and a dry sterile dressing is applied. The sponge and  instrument count were correct, blood loss was minimal, and there were no complications.    The patient tolerated the procedure well, awakened from anesthesia and was taken to the postanesthesia care unit in satisfactory condition.  Foley still in place.  Sponge count and instrument count correct at the end of the procedure.

## 2022-06-18 NOTE — Anesthesia Preprocedure Evaluation (Addendum)
Anesthesia Evaluation  Patient identified by MRN, date of birth, ID band Patient awake    Reviewed: Allergy & Precautions, NPO status , Patient's Chart, lab work & pertinent test results  History of Anesthesia Complications (+) PONV and history of anesthetic complications  Airway Mallampati: III   Neck ROM: Full    Dental no notable dental hx.    Pulmonary neg pulmonary ROS,    Pulmonary exam normal breath sounds clear to auscultation       Cardiovascular Exercise Tolerance: Good + CAD  Normal cardiovascular exam Rhythm:Regular Rate:Normal  ECG 06/12/22: normal   Neuro/Psych  Headaches,    GI/Hepatic GERD  Controlled,Diverticulitis    Endo/Other  negative endocrine ROS  Renal/GU negative Renal ROS     Musculoskeletal  (+) Arthritis ,   Abdominal   Peds  Hematology  (+) Blood dyscrasia, anemia ,   Anesthesia Other Findings   Reproductive/Obstetrics                            Anesthesia Physical Anesthesia Plan  ASA: 3  Anesthesia Plan: General   Post-op Pain Management:    Induction: Intravenous  PONV Risk Score and Plan: 4 or greater and Ondansetron, Dexamethasone, Treatment may vary due to age or medical condition and TIVA  Airway Management Planned: Oral ETT  Additional Equipment:   Intra-op Plan:   Post-operative Plan: Extubation in OR  Informed Consent: I have reviewed the patients History and Physical, chart, labs and discussed the procedure including the risks, benefits and alternatives for the proposed anesthesia with the patient or authorized representative who has indicated his/her understanding and acceptance.     Dental advisory given  Plan Discussed with: CRNA  Anesthesia Plan Comments: (Patient consented for risks of anesthesia including but not limited to:  - adverse reactions to medications - damage to eyes, teeth, lips or other oral mucosa - nerve  damage due to positioning  - sore throat or hoarseness - damage to heart, brain, nerves, lungs, other parts of body or loss of life  Informed patient about role of CRNA in peri- and intra-operative care.  Patient voiced understanding.)        Anesthesia Quick Evaluation

## 2022-06-18 NOTE — Transfer of Care (Signed)
Immediate Anesthesia Transfer of Care Note  Patient: Jean Brooks  Procedure(s) Performed: XI ROBOT ASSISTED SIGMOID COLECTOMY (Abdomen) INDOCYANINE GREEN FLUORESCENCE IMAGING (ICG) (Bilateral: Ureter) CYSTOSCOPY WITH ICG (Bilateral: Ureter)  Patient Location: PACU  Anesthesia Type:General  Level of Consciousness: drowsy  Airway & Oxygen Therapy: Patient Spontanous Breathing and Patient connected to face mask oxygen  Post-op Assessment: Report given to RN and Post -op Vital signs reviewed and stable  Post vital signs: Reviewed and stable  Last Vitals:  Vitals Value Taken Time  BP 143/70 06/18/22 1649  Temp 35.9 C 06/18/22 1649  Pulse 76 06/18/22 1652  Resp 12 06/18/22 1652  SpO2 99 % 06/18/22 1652  Vitals shown include unvalidated device data.  Last Pain:  Vitals:   06/18/22 1105  TempSrc: Temporal  PainSc: 0-No pain      Patients Stated Pain Goal: 0 (27/51/70 0174)  Complications: No notable events documented.

## 2022-06-18 NOTE — Progress Notes (Signed)
76 year old female scheduled for robotic assisted sigmoid colectomy by Dr. Windell Moment.  Bilateral ureteral injection into indocyanine green has been requested to aid in ureteral localization intraoperatively.  The procedure was discussed in detail.  Low complication rate of infection was discussed.  All questions were answered and she desires to proceed.

## 2022-06-18 NOTE — Anesthesia Postprocedure Evaluation (Signed)
Anesthesia Post Note  Patient: Jean Brooks  Procedure(s) Performed: XI ROBOT ASSISTED SIGMOID COLECTOMY (Abdomen) INDOCYANINE GREEN FLUORESCENCE IMAGING (ICG) (Bilateral: Ureter) CYSTOSCOPY WITH ICG (Bilateral: Ureter)  Patient location during evaluation: PACU Anesthesia Type: General Level of consciousness: awake and alert Pain management: pain level controlled Vital Signs Assessment: post-procedure vital signs reviewed and stable Respiratory status: spontaneous breathing, nonlabored ventilation, respiratory function stable and patient connected to nasal cannula oxygen Cardiovascular status: blood pressure returned to baseline and stable Postop Assessment: no apparent nausea or vomiting Anesthetic complications: no   No notable events documented.   Last Vitals:  Vitals:   06/18/22 1745 06/18/22 1815  BP: (!) 140/68 (!) 140/71  Pulse: 73 74  Resp: 15 18  Temp:  36.5 C  SpO2: 100% 97%    Last Pain:  Vitals:   06/18/22 1816  TempSrc:   PainSc: 0-No pain                 Molli Barrows

## 2022-06-18 NOTE — H&P (Signed)
HISTORY OF PRESENT ILLNESS:   Ms. Jean Brooks is a 76 y.o.female patient who comes for evaluation of the colonoscopy due to history of complicated diverticulitis.  Patient with history of complicated diverticulitis. Patient had an episode of diverticulitis with pericolonic abscess on mid June. The abscess was interloop and not amenable for percutaneous drainage. Patient needed 2 courses of oral antibiotic therapy and finally was able to resolve. She has not had any abdominal pain since then. She has been recovering adequately.  Patient had colonoscopy 6 days ago. Colonoscopy shows severe diverticulosis without any sign of malignancy. I personally evaluated the images of the colonoscopy.  Patient has been eating adequately. Denies any rectal bleeding.   PAST MEDICAL HISTORY:  Past Medical History:  Diagnosis Date  GI bleed  Heart murmur, unspecified  Paroxysmal atrial fibrillation (CMS-HCC)  Shingles     PAST SURGICAL HISTORY:  Past Surgical History:  Procedure Laterality Date  COLONOSCOPY 07/21/1992  FH Colon Polyps (2 Brothers)  COLONOSCOPY 06/23/2004  Serrated Adenoma, FH Colon Polyps (2 Brothers)  LAPAROSCOPIC SALPINGECTOMY 01/2018  and oopherectomy  APPENDECTOMY  COLONOSCOPY 12/10/2018, 12/22/2013, 12/05/2008  Dr. Kennis Carina - Diverticulosis, Int. Hemorrhoids, PHPolyp, FHPolyps(b)(b), rpt 5 yrs per RTE  EGD 12/10/2018, 12/22/2013, 12/05/2008, 06/23/2004  No repeat per RTE  HYSTERECTOMY  vaginal hys without BSO  TONSILLECTOMY  TUBAL LIGATION    MEDICATIONS:  Outpatient Encounter Medications as of 05/30/2022  Medication Sig Dispense Refill  atorvastatin (LIPITOR) 20 MG tablet Take 10 mg by mouth every other day  zolpidem (AMBIEN CR) 12.5 MG CR tablet 5  neomycin 500 mg tablet Take 2 tablets at 2 pm, 3 pm and 10 pm the day before surgery. 6 tablet 0   No facility-administered encounter medications on file as of 05/30/2022.    ALLERGIES:  Ciprofloxacin (bulk), Erythromycin  ethylsuccinate, Flagyl [metronidazole], Ibuprofen, Macrodantin [nitrofurantoin macrocrystalline], Penicillin, Pneumococcal vaccine, and Sulfa (sulfonamide antibiotics)  SOCIAL HISTORY:  Social History   Socioeconomic History  Marital status: Married  Tobacco Use  Smoking status: Never  Smokeless tobacco: Never  Substance and Sexual Activity  Alcohol use: No  Sexual activity: Not Currently  Birth control/protection: Surgical   FAMILY HISTORY:  Family History  Problem Relation Age of Onset  Aneurysm Father  Ulcers Father  Breast cancer Maternal Aunt  Stroke Mother  High blood pressure (Hypertension) Mother  COPD Mother  Rheum arthritis Mother  High blood pressure (Hypertension) Brother  Colon polyps Brother  Cancer Maternal Uncle  tongue and neck  Colon polyps Brother    GENERAL REVIEW OF SYSTEMS:   General ROS: negative for - chills, fatigue, fever, weight gain or weight loss Allergy and Immunology ROS: negative for - hives  Hematological and Lymphatic ROS: negative for - bleeding problems or bruising, negative for palpable nodes Endocrine ROS: negative for - heat or cold intolerance, hair changes Respiratory ROS: negative for - cough, shortness of breath or wheezing Cardiovascular ROS: no chest pain or palpitations GI ROS: negative for nausea, vomiting, abdominal pain, diarrhea, constipation Musculoskeletal ROS: negative for - joint swelling or muscle pain Neurological ROS: negative for - confusion, syncope Dermatological ROS: negative for pruritus and rash  PHYSICAL EXAM:  Vitals:  05/30/22 1311  BP: 136/70  Pulse: 59  .  Ht:160 cm ('5\' 3"'$ ) Wt:49.9 kg (110 lb) PPI:RJJO surface area is 1.49 meters squared. Body mass index is 19.49 kg/m.Marland Kitchen  GENERAL: Alert, active, oriented x3  HEENT: Pupils equal reactive to light. Extraocular movements are intact. Sclera clear. Palpebral conjunctiva normal  red color.Pharynx clear.  NECK: Supple with no palpable mass and no  adenopathy.  LUNGS: Sound clear with no rales rhonchi or wheezes.  HEART: Regular rhythm S1 and S2 without murmur.  ABDOMEN: Soft and depressible, nontender with no palpable mass, no hepatomegaly.   EXTREMITIES: Well-developed well-nourished symmetrical with no dependent edema.  NEUROLOGICAL: Awake alert oriented, facial expression symmetrical, moving all extremities.   IMPRESSION:   Diverticulitis of large intestine with perforation without bleeding [K57.20]  We discussed about the recommendation of elective partial colectomy. We discussed the minimal invasive approach with robotic assisted upper scopic partial colectomy. We discussed about the plan of trying to do an anastomosis. We also discussed about small chance of needing a colostomy if tissue is not healthy enough to put it together.  I also discussed with patient the risk of surgery that includes bleeding, infection, anastomosis leak, bowel obstruction, injury to adjacent organs such as ureter, vessels, intestine, bladder, kidney, and bowel. The patient reports she understood and agreed to proceed.   PLAN:  Robotic assisted laparoscopic sigmoid colectomy (29574, [id number]) CBC, CMP Avoid taking aspirin 5 days before the procedure Take bowel prep the day before as instructed Take antibiotic the day before as instructed Contact us if you have any concern  Patient verbalized understanding, all questions were answered, and were agreeable with the plan outlined above.   Herbert Pun, MD

## 2022-06-18 NOTE — Anesthesia Procedure Notes (Signed)
Procedure Name: Intubation Date/Time: 06/18/2022 12:34 PM  Performed by: Aline Brochure, CRNAPre-anesthesia Checklist: Patient identified, Patient being monitored, Timeout performed, Emergency Drugs available and Suction available Patient Re-evaluated:Patient Re-evaluated prior to induction Oxygen Delivery Method: Circle system utilized Preoxygenation: Pre-oxygenation with 100% oxygen Induction Type: IV induction Ventilation: Mask ventilation without difficulty Laryngoscope Size: 3 and McGraph Grade View: Grade I Tube type: Oral Tube size: 7.0 mm Number of attempts: 1 Airway Equipment and Method: Stylet and Video-laryngoscopy Placement Confirmation: ETT inserted through vocal cords under direct vision, positive ETCO2 and breath sounds checked- equal and bilateral Secured at: 19 cm Tube secured with: Tape Dental Injury: Teeth and Oropharynx as per pre-operative assessment

## 2022-06-19 LAB — CBC
HCT: 35.1 % — ABNORMAL LOW (ref 36.0–46.0)
Hemoglobin: 11.2 g/dL — ABNORMAL LOW (ref 12.0–15.0)
MCH: 28.2 pg (ref 26.0–34.0)
MCHC: 31.9 g/dL (ref 30.0–36.0)
MCV: 88.4 fL (ref 80.0–100.0)
Platelets: 169 10*3/uL (ref 150–400)
RBC: 3.97 MIL/uL (ref 3.87–5.11)
RDW: 13 % (ref 11.5–15.5)
WBC: 11.6 10*3/uL — ABNORMAL HIGH (ref 4.0–10.5)
nRBC: 0 % (ref 0.0–0.2)

## 2022-06-19 LAB — BASIC METABOLIC PANEL
Anion gap: 7 (ref 5–15)
BUN: 20 mg/dL (ref 8–23)
CO2: 23 mmol/L (ref 22–32)
Calcium: 8.4 mg/dL — ABNORMAL LOW (ref 8.9–10.3)
Chloride: 109 mmol/L (ref 98–111)
Creatinine, Ser: 0.89 mg/dL (ref 0.44–1.00)
GFR, Estimated: >60 mL/min (ref >60)
Glucose, Bld: 116 mg/dL — ABNORMAL HIGH (ref 70–99)
Potassium: 3.6 mmol/L (ref 3.5–5.1)
Sodium: 139 mmol/L (ref 135–145)

## 2022-06-19 MED ORDER — ENSURE ENLIVE PO LIQD: 237.0000 mL | Freq: Two times a day (BID) | ORAL | Status: DC

## 2022-06-19 MED ORDER — ADULT MULTIVITAMIN W/MINERALS CH: 1.0000 | ORAL_TABLET | Freq: Every day | ORAL | Status: DC

## 2022-06-19 NOTE — Discharge Summary (Signed)
Patient ID: Jean Brooks MRN: [id number] DOB/AGE: June 12, 1946 76 y.o.  Admit date: 06/18/2022 Discharge date: 06/19/2022   Discharge Diagnoses:  Principal Problem:   Diverticulitis large intestine   Procedures: Robot-assisted laparoscopic partial colectomy                       Robot assisted laparoscopic splenic flexure mobilization  Hospital Course: Patient admitted for management of chronic diverticulitis.  She underwent robotic assisted laparoscopic partial colectomy.  She has been tolerating the procedure well.  This morning patient without pain.  Patient is passing gas and having bowel movement.  Foley was removed and patient voiding spontaneously.  She has not taken pain medications.  She ambulated well.  The wounds are dry and clean.  Physical Exam Vitals reviewed.  Constitutional:      Appearance: Normal appearance.  HENT:     Head: Normocephalic.  Cardiovascular:     Rate and Rhythm: Normal rate and regular rhythm.     Pulses: Normal pulses.  Pulmonary:     Effort: Pulmonary effort is normal.  Abdominal:     General: Abdomen is flat.  Musculoskeletal:        General: Normal range of motion.     Cervical back: Normal range of motion.  Neurological:     General: No focal deficit present.     Mental Status: She is alert and oriented to person, place, and time.     Consults: None  Disposition: Discharge disposition: 01-Home or Self Care       Discharge Instructions     Diet - low sodium heart healthy   Complete by: As directed    Increase activity slowly   Complete by: As directed       Allergies as of 06/19/2022       Reactions   Sulfa Antibiotics Hives   Flagyl [metronidazole] Other (See Comments)   Can't remember but remembers it was not a good experience   Aspirin Rash   Azithromycin Rash   Within the hour   Ciprofloxacin Nausea Only, Rash   After 3 day   Erythromycin Rash   Confirmed Feb 2019   Ibuprofen Rash   Nitrofurantoin Rash    Penicillins Rash   Has patient had a PCN reaction causing immediate rash, facial/tongue/throat swelling, SOB or lightheadedness with hypotension: Yes Has patient had a PCN reaction causing severe rash involving mucus membranes or skin necrosis: No Has patient had a PCN reaction that required hospitalization: No Has patient had a PCN reaction occurring within the last 10 years: No If all of the above answers are "NO", then may proceed with Cephalosporin use.        Medication List     TAKE these medications    acetaminophen 500 MG tablet Commonly known as: TYLENOL Take 500 mg by mouth every 6 (six) hours as needed.   atorvastatin 20 MG tablet Commonly known as: LIPITOR Take 1 tablet (20 mg total) by mouth 2 (two) times a week. What changed:  how much to take when to take this additional instructions   fluticasone 50 MCG/ACT nasal spray Commonly known as: FLONASE Place 2 sprays into both nostrils daily as needed for allergies or rhinitis.   Magnesium 400 MG Caps Take 1 tablet by mouth as needed (leg cramps).   neomycin 500 MG tablet Commonly known as: MYCIFRADIN Take 2 tablets by mouth at 2 pm, 3 pm and 10 pm the day before surgery (Take 2 tablets  at 2 pm, 3 pm and 10 pm the day before surgery.)   ondansetron 4 MG disintegrating tablet Commonly known as: ZOFRAN-ODT Dissolve 1 tablet (4 mg total) on the tongue every 8 (eight) hours as needed for nausea or vomiting. (Take 1 tablet (4 mg total) by mouth every 8 (eight) hours as needed for nausea or vomiting.)   sodium chloride 5 % ophthalmic ointment Commonly known as: MURO 888 Place 1 application into the right eye at bedtime.   Vitamin D3 25 MCG (1000 UT) Caps Take 1 capsule by mouth daily at 6 (six) AM. Alternate with the magnesium for leg pain   zolpidem 12.5 MG CR tablet Commonly known as: AMBIEN CR Take 1 tablet (12.5 mg total) by mouth at bedtime as needed. What changed: when to take this        Follow-up  Information     Herbert Pun, MD Follow up in 2 week(s).   Specialty: General Surgery Contact information: [address] [place]  [address] [phone]

## 2022-06-19 NOTE — Discharge Instructions (Signed)

## 2022-06-20 ENCOUNTER — Other Ambulatory Visit: Payer: Self-pay

## 2022-06-20 LAB — SURGICAL PATHOLOGY

## 2022-06-20 MED ORDER — FLUCONAZOLE 150 MG PO TABS
150.0000 mg | ORAL_TABLET | Freq: Once | ORAL | 0 refills | Status: AC
  Filled 2022-06-20: qty 1, 1d supply, fill #0

## 2022-06-21 ENCOUNTER — Ambulatory Visit: Payer: 59 | Admitting: Internal Medicine

## 2022-06-25 ENCOUNTER — Other Ambulatory Visit: Payer: Self-pay

## 2022-06-26 ENCOUNTER — Other Ambulatory Visit: Payer: Self-pay

## 2022-06-27 ENCOUNTER — Other Ambulatory Visit: Payer: Self-pay

## 2022-07-19 ENCOUNTER — Other Ambulatory Visit: Payer: Self-pay | Admitting: Internal Medicine

## 2022-07-19 DIAGNOSIS — Z1231 Encounter for screening mammogram for malignant neoplasm of breast: Secondary | ICD-10-CM

## 2022-07-22 ENCOUNTER — Other Ambulatory Visit: Payer: Self-pay

## 2022-07-22 MED ORDER — FLUAD QUADRIVALENT 0.5 ML IM PRSY
PREFILLED_SYRINGE | INTRAMUSCULAR | 0 refills | Status: DC
  Filled 2022-07-22 – 2022-07-23 (×2): qty 0.5, 1d supply, fill #0
  Filled ????-??-??: fill #0

## 2022-07-23 ENCOUNTER — Other Ambulatory Visit: Payer: Self-pay

## 2022-07-25 DIAGNOSIS — L821 Other seborrheic keratosis: Secondary | ICD-10-CM | POA: Diagnosis not present

## 2022-07-25 DIAGNOSIS — D225 Melanocytic nevi of trunk: Secondary | ICD-10-CM | POA: Diagnosis not present

## 2022-07-25 DIAGNOSIS — L578 Other skin changes due to chronic exposure to nonionizing radiation: Secondary | ICD-10-CM | POA: Diagnosis not present

## 2022-07-25 DIAGNOSIS — L905 Scar conditions and fibrosis of skin: Secondary | ICD-10-CM | POA: Diagnosis not present

## 2022-07-26 ENCOUNTER — Other Ambulatory Visit: Payer: Self-pay

## 2022-07-26 MED ORDER — COVID-19 MRNA VAC-TRIS(PFIZER) 30 MCG/0.3ML IM SUSY
0.3000 mL | PREFILLED_SYRINGE | Freq: Once | INTRAMUSCULAR | 0 refills | Status: AC
  Filled 2022-08-01: qty 0.3, 1d supply, fill #0

## 2022-07-29 ENCOUNTER — Other Ambulatory Visit: Payer: Self-pay

## 2022-07-30 ENCOUNTER — Other Ambulatory Visit: Payer: Self-pay

## 2022-08-01 ENCOUNTER — Other Ambulatory Visit: Payer: Self-pay

## 2022-08-02 ENCOUNTER — Other Ambulatory Visit: Payer: Self-pay | Admitting: Gastroenterology

## 2022-08-02 ENCOUNTER — Other Ambulatory Visit: Payer: Self-pay

## 2022-08-02 MED ORDER — FLUCONAZOLE 100 MG PO TABS
100.0000 mg | ORAL_TABLET | Freq: Once | ORAL | 0 refills | Status: AC
  Filled 2022-08-02: qty 1, 1d supply, fill #0

## 2022-08-02 MED ORDER — CIPROFLOXACIN HCL 500 MG PO TABS
500.0000 mg | ORAL_TABLET | Freq: Two times a day (BID) | ORAL | 0 refills | Status: AC
  Filled 2022-08-02: qty 6, 3d supply, fill #0

## 2022-08-02 NOTE — Progress Notes (Signed)
Has increased frequency of urination , burning and likely UTI also has some chills. Sending in script for Ciprofloxacin which she has taken for it in the past

## 2022-08-07 ENCOUNTER — Telehealth: Payer: Self-pay | Admitting: Internal Medicine

## 2022-08-07 NOTE — Telephone Encounter (Signed)
FYI

## 2022-08-07 NOTE — Telephone Encounter (Signed)
Pt called stating the pharmacy can not get ambien in. Pt stated she has enough to cover her until the end of the month.

## 2022-08-08 ENCOUNTER — Telehealth: Payer: Self-pay | Admitting: Internal Medicine

## 2022-08-08 NOTE — Telephone Encounter (Signed)
Pt called stating she thinks has a UTI because she sees spots of blood when she urinate. There are no available appointments

## 2022-08-08 NOTE — Telephone Encounter (Signed)
Spoke with pt and informed her that Dr. Derrel Nip is out of the office until Monday and that we have no available appts tomorrow. Pt was advised to be evaluated at [facility]. Pt gave a verbal understanding.

## 2022-08-09 ENCOUNTER — Other Ambulatory Visit: Payer: Self-pay

## 2022-08-09 ENCOUNTER — Ambulatory Visit
Admission: EM | Admit: 2022-08-09 | Discharge: 2022-08-09 | Disposition: A | Discharge status: Home / Self Care | Payer: 59 | Attending: Emergency Medicine | Admitting: Emergency Medicine

## 2022-08-09 DIAGNOSIS — N390 Urinary tract infection, site not specified: Secondary | ICD-10-CM | POA: Insufficient documentation

## 2022-08-09 DIAGNOSIS — N898 Other specified noninflammatory disorders of vagina: Secondary | ICD-10-CM | POA: Diagnosis not present

## 2022-08-09 DIAGNOSIS — R319 Hematuria, unspecified: Secondary | ICD-10-CM | POA: Insufficient documentation

## 2022-08-09 LAB — POCT URINALYSIS DIP (MANUAL ENTRY)
Bilirubin, UA: NEGATIVE
Glucose, UA: NEGATIVE mg/dL
Nitrite, UA: NEGATIVE
Protein Ur, POC: NEGATIVE mg/dL
Spec Grav, UA: 1.015 (ref 1.010–1.025)
Urobilinogen, UA: 0.2 E.U./dL
pH, UA: 5.5 (ref 5.0–8.0)

## 2022-08-09 MED ORDER — CEFDINIR 300 MG PO CAPS
300.0000 mg | ORAL_CAPSULE | Freq: Two times a day (BID) | ORAL | 0 refills | Status: DC
  Filled 2022-08-09: qty 20, 10d supply, fill #0

## 2022-08-09 NOTE — ED Provider Notes (Signed)
HPI  SUBJECTIVE:  Jean Brooks is a 76 y.o. female who presents with 8 days of dysuria, low midline achy abdominal pain/pressure, left back pain, urinary urgency, frequency, cloudy odorous urine.  She notes blood on the toilet paper when she wipes.  She states that she feels as if she is able to completely empty her bladder.  No back swelling.  No gross hematuria.  No nausea, vomiting, fevers, flank pain, vaginal discharge, rash, itching.  Questionable odor when she goes to take a shower.  She states that she has not been sexually active in some time.  She was treated for presumed UTI with 3 days of Cipro last week, she states that she got better, but the symptoms restarted 2 days ago.  She has also tried Azo and Tylenol.  Last dose of Tylenol was within 6 hours of evaluation.  There were no alleviating factors.  No aggravating factors.  She has a past medical history of frequent UTIs, remote history of vaginal yeast infections.  No history of pyelonephritis, nephrolithiasis, chronic kidney disease, diabetes, hypertension, BV.  She is status post hysterectomy, colectomy.  PCP: [facility].    Past Medical History:  Diagnosis Date   Abdominal aortic atherosclerosis (HCC)    Arthritis    bil hands/thumbs   Bradycardia    Chest pain, non-cardiac 2010   a. stress echo 2010: nl treadmill EKG w/o evidence of ischemia or arrhythmia, good exercise tolerance for age, normal stress echo images w/o evidence of myocardial ischemia; b. baseline echo with EF 55% peak stress showed nl LV systolic function wtih EF 65% w/o evidence of HK   Chronic colitis    Chronic venous insufficiency    Complication of anesthesia    Coronary artery disease    Cyst, ovarian 12/29/2011   Serous cystadenomas, bilateral.  S/pBSO May 2019 ([facility])    Diverticulitis of colon with hemorrhage 04/2008   Hospitalized [facility]   Family history of adverse reaction to anesthesia    mom and maternal grandmother-agitated   GERD  (gastroesophageal reflux disease)    h/o   H/O syncope    a. echo 2010: EF 60%, LV nl size, RV nl size & fxn, atria nl size, aorta nl, no pericardial effusion, aortic valve nl, mitral valve nl w/ mild insufficiency, tricuspid valve nl w/ mild insufficiency, pulmonic valve nl   Heart murmur    asymptomatic-in pt's 20's   Lymphedema    Migraine headache    h/o migraines   Normocytic anemia    PONV (postoperative nausea and vomiting)    Scoliosis of thoracic spine    Shingles    SVT (supraventricular tachycardia)    Syncope and collapse     Past Surgical History:  Procedure Laterality Date   ABDOMINAL HYSTERECTOMY  1991   APPENDECTOMY  1963   COLONOSCOPY     COLONOSCOPY WITH PROPOFOL N/A 05/24/2022   Procedure: COLONOSCOPY WITH PROPOFOL;  Surgeon: Jonathon Bellows, MD;  Location: [facility] ENDOSCOPY;  Service: Gastroenterology;  Laterality: N/A;   ESOPHAGOGASTRODUODENOSCOPY     LAPAROSCOPIC BILATERAL SALPINGO OOPHERECTOMY Bilateral 02/20/2018   Procedure: LAPAROSCOPIC BILATERAL SALPINGO OOPHORECTOMY;  Surgeon: Ward, Honor Loh, MD;  Location: [facility] ORS;  Service: Gynecology;  Laterality: Bilateral;   LAPAROSCOPIC LYSIS OF ADHESIONS  02/20/2018   Procedure: LAPAROSCOPIC LYSIS OF ADHESIONS;  Surgeon: Ward, Honor Loh, MD;  Location: [facility] ORS;  Service: Gynecology;;   TONSILLECTOMY     age 89 or 68    Family History  Problem  Relation Age of Onset   COPD Mother 25       respiratory failure/fibrosis, seizures   Stroke Mother    Hypertension Mother    Supraventricular tachycardia Mother    COPD Father 41   Mental illness Father        Parkinson's Dementia   Aneurysm Father 17   Stroke Son 75       hypertensive, left brain    Heart attack Maternal Grandfather 4       passed   Stroke Maternal Grandmother    Breast cancer Maternal Aunt     Social History   Tobacco Use   Smoking status: Never   Smokeless tobacco: Never  Vaping Use   Vaping Use: Never used  Substance Use Topics   Alcohol  use: No   Drug use: No    No current facility-administered medications for this encounter.  Current Outpatient Medications:    cefdinir (OMNICEF) 300 MG capsule, Take 1 capsule (300 mg total) by mouth 2 (two) times daily., Disp: 20 capsule, Rfl: 0   acetaminophen (TYLENOL) 500 MG tablet, Take 500 mg by mouth every 6 (six) hours as needed., Disp: , Rfl:    atorvastatin (LIPITOR) 20 MG tablet, Take 1 tablet (20 mg total) by mouth 2 (two) times a week. (Patient taking differently: Take 10 mg by mouth every other day. Bedtime-), Disp: 24 tablet, Rfl: 2   Cholecalciferol (VITAMIN D3) 1000 UNITS CAPS, Take 1 capsule by mouth daily at 6 (six) AM. Alternate with the magnesium for leg pain, Disp: , Rfl:    fluticasone (FLONASE) 50 MCG/ACT nasal spray, Place 2 sprays into both nostrils daily as needed for allergies or rhinitis., Disp: 16 g, Rfl: 6   influenza vaccine adjuvanted (FLUAD QUADRIVALENT) 0.5 ML injection, Inject into the muscle., Disp: 0.5 mL, Rfl: 0   Magnesium 400 MG CAPS, Take 1 tablet by mouth as needed (leg cramps)., Disp: , Rfl:    neomycin (MYCIFRADIN) 500 MG tablet, Take 2 tablets at 2 pm, 3 pm and 10 pm the day before surgery., Disp: 6 tablet, Rfl: 0   ondansetron (ZOFRAN-ODT) 4 MG disintegrating tablet, Take 1 tablet (4 mg total) by mouth every 8 (eight) hours as needed for nausea or vomiting., Disp: 20 tablet, Rfl: 0   sodium chloride (MURO 128) 5 % ophthalmic ointment, Place 1 application into the right eye at bedtime., Disp: , Rfl:    zolpidem (AMBIEN CR) 12.5 MG CR tablet, Take 1 tablet (12.5 mg total) by mouth at bedtime as needed. (Patient taking differently: Take 12.5 mg by mouth at bedtime.), Disp: 30 tablet, Rfl: 5  Allergies  Allergen Reactions   Sulfa Antibiotics Hives   Flagyl [Metronidazole] Other (See Comments)    Can't remember but remembers it was not a good experience   Aspirin Rash   Azithromycin Rash    Within the hour   Ciprofloxacin Nausea Only and Rash     After 3 day   Erythromycin Rash    Confirmed Feb 2019   Ibuprofen Rash   Nitrofurantoin Rash   Penicillins Rash    Has patient had a PCN reaction causing immediate rash, facial/tongue/throat swelling, SOB or lightheadedness with hypotension: Yes Has patient had a PCN reaction causing severe rash involving mucus membranes or skin necrosis: No Has patient had a PCN reaction that required hospitalization: No Has patient had a PCN reaction occurring within the last 10 years: No If all of the above answers are "NO", then may  proceed with Cephalosporin use.      ROS  As noted in HPI.   Physical Exam  BP (!) 151/72   Pulse 68   Temp 97.9 F (36.6 C)   Resp 16   SpO2 98%   Constitutional: Well developed, well nourished, no acute distress Eyes:  EOMI, conjunctiva normal bilaterally HENT: Normocephalic, atraumatic,mucus membranes moist Respiratory: Normal inspiratory effort Cardiovascular: Normal rate GI: nondistended. Soft. Positive right flank tenderness.  Back: Mild L CVAT skin: No rash, skin intact Musculoskeletal: no deformities Neurologic: Alert & oriented x 3, no focal neuro deficits Psychiatric: Speech and behavior appropriate   ED Course   Medications - No data to display  Orders Placed This Encounter  Procedures   Urine Culture    Standing Status:   Standing    Number of Occurrences:   1    Order Specific Question:   Indication    Answer:   Dysuria   POCT urinalysis dipstick    Standing Status:   Standing    Number of Occurrences:   1    Results for orders placed or performed during the hospital encounter of 08/09/22 (from the past 24 hour(s))  POCT urinalysis dipstick     Status: Abnormal   Collection Time: 08/09/22  1:41 PM  Result Value Ref Range   Color, UA yellow yellow   Clarity, UA clear clear   Glucose, UA negative negative mg/dL   Bilirubin, UA negative negative   Ketones, POC UA trace (5) (A) negative mg/dL   Spec Grav, UA 1.015 1.010 -  1.025   Blood, UA large (A) negative   pH, UA 5.5 5.0 - 8.0   Protein Ur, POC negative negative mg/dL   Urobilinogen, UA 0.2 0.2 or 1.0 E.U./dL   Nitrite, UA Negative Negative   Leukocytes, UA Small (1+) (A) Negative   No results found.  ED Clinical Impression  1. Urinary tract infection with hematuria, site unspecified   2. Vaginal odor      ED Assessment/Plan     Calculated creatinine clearance from labs done in September 41 mL/min.  Do not need to renally dose Omnicef.  Concern for complicated UTI, especially given recent Cipro use.  Will send home with 10 days of Omnicef, and send urine off for culture.  Pyridium is contraindicated due to creatinine clearance below 50.  She is also reporting an odor, difficult to tell if this is from the urine or from the vagina, will check a wet prep for BV and yeast.  nephrolithiasis showing up for the first time at her age would be unlikely.  Increase fluids.  Tylenol 1000 mg p.o. 3 times daily.  Follow-up with PCP as needed.  Discussed labs,  MDM, treatment plan, and plan for follow-up with patient. Discussed sn/sx that should prompt return to the ED. patient agrees with plan.   Meds ordered this encounter  Medications   cefdinir (OMNICEF) 300 MG capsule    Sig: Take 1 capsule (300 mg total) by mouth 2 (two) times daily.    Dispense:  20 capsule    Refill:  0      *This clinic note was created using [employer]. Therefore, there may be occasional mistakes despite careful proofreading.  ?    Melynda Ripple, MD 08/10/22 [phone]

## 2022-08-09 NOTE — Discharge Instructions (Signed)
I have sent your urine off for culture to make sure that we have you on the correct antibiotic.  Continue drinking plenty of extra fluids.  May take at 1000 mg of Tylenol 3 times a day as needed.  Unfortunately, I cannot send you home with Pyridium due to your kidney function.  Wet prep will be back in several days.  We will contact you if it comes back abnormal and will call in the appropriate medication.

## 2022-08-09 NOTE — ED Triage Notes (Signed)
Pt. Presents to UC w/ c/o of dysuria and left flank pain for the past 2 days.

## 2022-08-12 ENCOUNTER — Telehealth: Payer: Self-pay

## 2022-08-12 DIAGNOSIS — R300 Dysuria: Secondary | ICD-10-CM

## 2022-08-12 LAB — CERVICOVAGINAL ANCILLARY ONLY
Bacterial Vaginitis (gardnerella): NEGATIVE
Candida Glabrata: NEGATIVE
Candida Vaginitis: NEGATIVE
Comment: NEGATIVE
Comment: NEGATIVE
Comment: NEGATIVE

## 2022-08-12 LAB — URINE CULTURE: Culture: 10000 — AB

## 2022-08-12 NOTE — Telephone Encounter (Signed)
Patient states we suggested she go to Urgent Care last week.  Patient states she has a UTI and she is having an allergic reaction to the medication they gave her.  Patient states she would like to know if we can give her something else without her having to come in today for an appointment.

## 2022-08-12 NOTE — Telephone Encounter (Signed)
Spoke with pt and she stated that she was treated with Cefdnir for a UTI over the weekend. Pt stated that after taking the first one she started itching all over and then after taking the 2nd dose she noticed that she felt a little SOBr and nauseous. Pt took a Benadryl and stopped the antibiotic. Pt is wanting to know if something different can be call in for her.

## 2022-08-12 NOTE — Telephone Encounter (Signed)
LMTCB. Need to let pt know that Dr. Derrel Nip advises that she come by the office and give another urine specimen. Labs have ordered. Please schedule lab appt.

## 2022-08-12 NOTE — Telephone Encounter (Signed)
Pt called stating someone called her and she thought it was about the UTI. I let the pt know that the provider wanted her to submit another UTI. Pt really wanted the provider to use the one she had at urgent care but if the provider wants her to submit another she will

## 2022-08-13 ENCOUNTER — Other Ambulatory Visit (INDEPENDENT_AMBULATORY_CARE_PROVIDER_SITE_OTHER): Payer: 59

## 2022-08-13 DIAGNOSIS — R300 Dysuria: Secondary | ICD-10-CM | POA: Diagnosis not present

## 2022-08-14 ENCOUNTER — Other Ambulatory Visit: Payer: Self-pay

## 2022-08-14 LAB — URINALYSIS, ROUTINE W REFLEX MICROSCOPIC
Bilirubin Urine: NEGATIVE
Ketones, ur: NEGATIVE
Leukocytes,Ua: NEGATIVE
Nitrite: NEGATIVE
Specific Gravity, Urine: <=1.005 — AB (ref 1.000–1.030)
Total Protein, Urine: NEGATIVE
Urine Glucose: NEGATIVE
Urobilinogen, UA: 0.2 (ref 0.0–1.0)
pH: 7 (ref 5.0–8.0)

## 2022-08-14 LAB — URINE CULTURE
MICRO NUMBER:: 14186793
SPECIMEN QUALITY:: ADEQUATE

## 2022-08-16 NOTE — Telephone Encounter (Signed)
Spoke with pt to let her know that she does not have a UTI. Pt stated that she is still having the same discomfort. Advised pt to schedule an appt for in person evaluation. Pt stated that she is scheduled for her physical on 11/29 and she will just wait until then.

## 2022-08-16 NOTE — Telephone Encounter (Signed)
Pt submitted another urine on 08/13/2022.

## 2022-08-21 DIAGNOSIS — H353131 Nonexudative age-related macular degeneration, bilateral, early dry stage: Secondary | ICD-10-CM | POA: Diagnosis not present

## 2022-08-28 ENCOUNTER — Ambulatory Visit (INDEPENDENT_AMBULATORY_CARE_PROVIDER_SITE_OTHER): Payer: 59 | Admitting: Internal Medicine

## 2022-08-28 ENCOUNTER — Other Ambulatory Visit: Payer: Self-pay

## 2022-08-28 ENCOUNTER — Encounter: Payer: Self-pay | Admitting: Internal Medicine

## 2022-08-28 VITALS — BP 120/68 | HR 86 | Temp 98.0°F | Resp 16 | Ht 63.0 in | Wt 105.4 lb

## 2022-08-28 DIAGNOSIS — Z0000 Encounter for general adult medical examination without abnormal findings: Secondary | ICD-10-CM

## 2022-08-28 DIAGNOSIS — R310 Gross hematuria: Secondary | ICD-10-CM | POA: Diagnosis not present

## 2022-08-28 DIAGNOSIS — M8580 Other specified disorders of bone density and structure, unspecified site: Secondary | ICD-10-CM

## 2022-08-28 DIAGNOSIS — E2740 Unspecified adrenocortical insufficiency: Secondary | ICD-10-CM | POA: Insufficient documentation

## 2022-08-28 DIAGNOSIS — R7301 Impaired fasting glucose: Secondary | ICD-10-CM | POA: Diagnosis not present

## 2022-08-28 DIAGNOSIS — D649 Anemia, unspecified: Secondary | ICD-10-CM | POA: Diagnosis not present

## 2022-08-28 DIAGNOSIS — E782 Mixed hyperlipidemia: Secondary | ICD-10-CM | POA: Diagnosis not present

## 2022-08-28 DIAGNOSIS — Z0001 Encounter for general adult medical examination with abnormal findings: Secondary | ICD-10-CM

## 2022-08-28 DIAGNOSIS — G4700 Insomnia, unspecified: Secondary | ICD-10-CM

## 2022-08-28 DIAGNOSIS — R634 Abnormal weight loss: Secondary | ICD-10-CM

## 2022-08-28 DIAGNOSIS — R319 Hematuria, unspecified: Secondary | ICD-10-CM | POA: Insufficient documentation

## 2022-08-28 LAB — CBC WITH DIFFERENTIAL/PLATELET
Basophils Absolute: 0.1 10*3/uL (ref 0.0–0.1)
Basophils Relative: 2 % (ref 0.0–3.0)
Eosinophils Absolute: 0.1 10*3/uL (ref 0.0–0.7)
Eosinophils Relative: 2.7 % (ref 0.0–5.0)
HCT: 39.6 % (ref 36.0–46.0)
Hemoglobin: 13.5 g/dL (ref 12.0–15.0)
Lymphocytes Relative: 27.1 % (ref 12.0–46.0)
Lymphs Abs: 1.1 10*3/uL (ref 0.7–4.0)
MCHC: 34.1 g/dL (ref 30.0–36.0)
MCV: 87.1 fl (ref 78.0–100.0)
Monocytes Absolute: 0.4 10*3/uL (ref 0.1–1.0)
Monocytes Relative: 9.3 % (ref 3.0–12.0)
Neutro Abs: 2.4 10*3/uL (ref 1.4–7.7)
Neutrophils Relative %: 58.9 % (ref 43.0–77.0)
Platelets: 238 10*3/uL (ref 150.0–400.0)
RBC: 4.54 Mil/uL (ref 3.87–5.11)
RDW: 14.3 % (ref 11.5–15.5)
WBC: 4 10*3/uL (ref 4.0–10.5)

## 2022-08-28 LAB — URINALYSIS, ROUTINE W REFLEX MICROSCOPIC
Bilirubin Urine: NEGATIVE
Hgb urine dipstick: NEGATIVE
Ketones, ur: NEGATIVE
Leukocytes,Ua: NEGATIVE
Nitrite: NEGATIVE
RBC / HPF: NONE SEEN (ref 0–?)
Specific Gravity, Urine: 1.01 (ref 1.000–1.030)
Total Protein, Urine: NEGATIVE
Urine Glucose: NEGATIVE
Urobilinogen, UA: 0.2 (ref 0.0–1.0)
pH: 6.5 (ref 5.0–8.0)

## 2022-08-28 LAB — COMPREHENSIVE METABOLIC PANEL
ALT: 15 U/L (ref 0–35)
AST: 19 U/L (ref 0–37)
Albumin: 4.6 g/dL (ref 3.5–5.2)
Alkaline Phosphatase: 56 U/L (ref 39–117)
BUN: 19 mg/dL (ref 6–23)
CO2: 30 mEq/L (ref 19–32)
Calcium: 9.4 mg/dL (ref 8.4–10.5)
Chloride: 102 mEq/L (ref 96–112)
Creatinine, Ser: 0.86 mg/dL (ref 0.40–1.20)
GFR: 66.19 mL/min (ref >60.00)
Glucose, Bld: 86 mg/dL (ref 70–99)
Potassium: 4.1 mEq/L (ref 3.5–5.1)
Sodium: 138 mEq/L (ref 135–145)
Total Bilirubin: 0.5 mg/dL (ref 0.2–1.2)
Total Protein: 7.3 g/dL (ref 6.0–8.3)

## 2022-08-28 LAB — HEMOGLOBIN A1C: Hgb A1c MFr Bld: 5.9 % (ref 4.6–6.5)

## 2022-08-28 LAB — LIPID PANEL
Cholesterol: 208 mg/dL — ABNORMAL HIGH (ref 0–200)
HDL: 74.7 mg/dL (ref >39.00)
LDL Cholesterol: 120 mg/dL — ABNORMAL HIGH (ref 0–99)
NonHDL: 132.82
Total CHOL/HDL Ratio: 3
Triglycerides: 64 mg/dL (ref 0.0–149.0)
VLDL: 12.8 mg/dL (ref 0.0–40.0)

## 2022-08-28 LAB — IBC + FERRITIN
Ferritin: 41.3 ng/mL (ref 10.0–291.0)
Iron: 89 ug/dL (ref 42–145)
Saturation Ratios: 23.2 % (ref 20.0–50.0)
TIBC: 383.6 ug/dL (ref 250.0–450.0)
Transferrin: 274 mg/dL (ref 212.0–360.0)

## 2022-08-28 LAB — B12 AND FOLATE PANEL
Folate: 8.1 ng/mL (ref >5.9)
Vitamin B-12: 256 pg/mL (ref 211–911)

## 2022-08-28 MED ORDER — ZOLPIDEM TARTRATE ER 12.5 MG PO TBCR
12.5000 mg | EXTENDED_RELEASE_TABLET | Freq: Every evening | ORAL | 5 refills | Status: DC | PRN
  Filled 2022-08-28: qty 30, 30d supply, fill #0
  Filled 2022-09-26: qty 30, 30d supply, fill #1
  Filled 2022-10-24: qty 30, 30d supply, fill #2
  Filled 2022-11-19: qty 30, 30d supply, fill #3
  Filled 2022-12-17 – 2022-12-19 (×3): qty 30, 30d supply, fill #4

## 2022-08-28 NOTE — Assessment & Plan Note (Signed)
I have reviewed her diet and recommended that she increase her protein and fat intake while monitoring her carbohydrates.  

## 2022-08-28 NOTE — Progress Notes (Signed)
Patient ID: Jean Brooks, female    DOB: Oct 08, 1945  Age: 76 y.o. MRN: [id number]  The patient is here for annual Medicare wellness examination and management of other chronic and acute problems.   The risk factors are reflected in the social history.  The roster of all physicians providing medical care to patient - is listed in the Snapshot section of the chart.  Activities of daily living:  The patient is 100% independent in all ADLs: dressing, toileting, feeding as well as independent mobility  Home safety : The patient has smoke detectors in the home. They wear seatbelts.  There are no firearms at home. There is no violence in the home.   There is no risks for hepatitis, STDs or HIV. There is no   history of blood transfusion. They have no travel history to infectious disease endemic areas of the world.  The patient has seen their dentist in the last six month. They have seen their eye doctor in the last year. They admit to slight hearing difficulty with regard to whispered voices and some television programs.  They have deferred audiologic testing in the last year.  They do not  have excessive sun exposure. Discussed the need for sun protection: hats, long sleeves and use of sunscreen if there is significant sun exposure.   Diet: the importance of a healthy diet is discussed. They do have a healthy diet.  The benefits of regular aerobic exercise were discussed. She walks 4 times per week ,  20 minutes.   Depression screen: there are no signs or vegative symptoms of depression- irritability, change in appetite, anhedonia, sadness/tearfullness.  Cognitive assessment: the patient manages all their financial and personal affairs and is actively engaged. They could relate day,date,year and events; recalled 2/3 objects at 3 minutes; performed clock-face test normally.  The following portions of the patient's history were reviewed and updated as appropriate: allergies, current medications,  past family history, past medical history,  past surgical history, past social history  and problem list.  Visual acuity was not assessed per patient preference since she has regular follow up with her ophthalmologist. Hearing and body mass index were assessed and reviewed.   During the course of the visit the patient was educated and counseled about appropriate screening and preventive services including : fall prevention , diabetes screening, nutrition counseling, colorectal cancer screening, and recommended immunizations.    CC: The primary encounter diagnosis was Encounter for preventive health examination. Diagnoses of Anemia, unspecified type, Impaired fasting glucose, Hyperlipidemia, mixed, Gross hematuria, Unspecified adrenocortical insufficiency ([place]), Insomnia, persistent, Osteopenia, unspecified location, and Weight loss were also pertinent to this visit.   S/p hemicolectomy in September for recurrent diverticulitis   UTI : treated  Nov 10 , questionable UTI.  Had allergic reaction  to cefdinir (tongue swelled and started itching)    Wt loss since may of 6 lbs since May.  Diet reviewed. Very low fat,  low cal , low protein. per her preferences.    History Jolin has a past medical history of Abdominal aortic atherosclerosis ([place]), Arthritis, Bradycardia, Chest pain, non-cardiac (2010), Chronic colitis, Chronic venous insufficiency, Complication of anesthesia, Coronary artery disease, Cyst, ovarian (12/29/2011), Diverticulitis of colon with hemorrhage (04/2008), Family history of adverse reaction to anesthesia, GERD (gastroesophageal reflux disease), H/O syncope, Heart murmur, Lymphedema, Migraine headache, Normocytic anemia, PONV (postoperative nausea and vomiting), Scoliosis of thoracic spine, Shingles, SVT (supraventricular tachycardia), and Syncope and collapse.   She has a past surgical history that  includes Appendectomy (1963); Abdominal hysterectomy (1991); Colonoscopy;  Esophagogastroduodenoscopy; Tonsillectomy; Laparoscopic bilateral salpingo oophorectomy (Bilateral, 02/20/2018); Laparoscopic lysis of adhesions (02/20/2018); and Colonoscopy with propofol (N/A, 05/24/2022).   Her family history includes Aneurysm (age of onset: 36) in her father; Breast cancer in her maternal aunt; COPD (age of onset: 69) in her father; COPD (age of onset: 65) in her mother; Heart attack (age of onset: 65) in her maternal grandfather; Hypertension in her mother; Mental illness in her father; Stroke in her maternal grandmother and mother; Stroke (age of onset: 58) in her son; Supraventricular tachycardia in her mother.She reports that she has never smoked. She has never used smokeless tobacco. She reports that she does not drink alcohol and does not use drugs.  Outpatient Medications Prior to Visit  Medication Sig Dispense Refill   acetaminophen (TYLENOL) 500 MG tablet Take 500 mg by mouth every 6 (six) hours as needed.     atorvastatin (LIPITOR) 20 MG tablet Take 1 tablet (20 mg total) by mouth 2 (two) times a week. (Patient taking differently: Take 10 mg by mouth every other day. Bedtime-) 24 tablet 2   Cholecalciferol (VITAMIN D3) 1000 UNITS CAPS Take 1 capsule by mouth daily at 6 (six) AM. Alternate with the magnesium for leg pain     fluticasone (FLONASE) 50 MCG/ACT nasal spray Place 2 sprays into both nostrils daily as needed for allergies or rhinitis. 16 g 6   influenza vaccine adjuvanted (FLUAD QUADRIVALENT) 0.5 ML injection Inject into the muscle. 0.5 mL 0   Magnesium 400 MG CAPS Take 1 tablet by mouth as needed (leg cramps).     ondansetron (ZOFRAN-ODT) 4 MG disintegrating tablet Take 1 tablet (4 mg total) by mouth every 8 (eight) hours as needed for nausea or vomiting. 20 tablet 0   sodium chloride (MURO 128) 5 % ophthalmic ointment Place 1 application into the right eye at bedtime.     cefdinir (OMNICEF) 300 MG capsule Take 1 capsule (300 mg total) by mouth 2 (two) times daily.  20 capsule 0   neomycin (MYCIFRADIN) 500 MG tablet Take 2 tablets at 2 pm, 3 pm and 10 pm the day before surgery. 6 tablet 0   zolpidem (AMBIEN CR) 12.5 MG CR tablet Take 1 tablet (12.5 mg total) by mouth at bedtime as needed. (Patient taking differently: Take 12.5 mg by mouth at bedtime.) 30 tablet 5   No facility-administered medications prior to visit.    Review of Systems  Patient denies headache, fevers, malaise, unintentional weight loss, skin rash, eye pain, sinus congestion and sinus pain, sore throat, dysphagia,  hemoptysis , cough, dyspnea, wheezing, chest pain, palpitations, orthopnea, edema, abdominal pain, nausea, melena, diarrhea, constipation, flank pain, dysuria, hematuria, urinary  Frequency, nocturia, numbness, tingling, seizures,  Focal weakness, Loss of consciousness,  Tremor, insomnia, depression, anxiety, and suicidal ideation.     Objective:  BP 120/68 (BP Location: Left Arm, Patient Position: Sitting, Cuff Size: Small)   Pulse 86   Temp 98 F (36.7 C) (Temporal)   Resp 16   Ht _0  (1.6 m)   Wt 105 lb 6.4 oz (47.8 kg)   SpO2 98%   BMI 18.67 kg/m   Physical Exam  .General appearance: alert, cooperative and appears stated age Head: Normocephalic, without obvious abnormality, atraumatic Eyes: conjunctivae/corneas clear. PERRL, EOM's intact. Fundi benign. Ears: normal TM's and external ear canals both ears Nose: Nares normal. Septum midline. Mucosa normal. No drainage or sinus tenderness. Throat: lips, mucosa, and tongue normal;  teeth and gums normal Neck: no adenopathy, no carotid bruit, no JVD, supple, symmetrical, trachea midline and thyroid not enlarged, symmetric, no tenderness/mass/nodules Lungs: clear to auscultation bilaterally Breasts: normal appearance, no masses or tenderness Heart: regular rate and rhythm, S1, S2 normal, no murmur, click, rub or gallop Abdomen: soft, non-tender; bowel sounds normal; no masses,  no organomegaly Extremities:  extremities normal, atraumatic, no cyanosis or edema Pulses: 2+ and symmetric Skin: Skin color, texture, turgor normal. No rashes or lesions Neurologic: Alert and oriented X 3, normal strength and tone. Normal symmetric reflexes. Normal coordination and gait.    Assessment & Plan:   Problem List Items Addressed This Visit     Encounter for preventive health examination - Primary    age appropriate education and counseling updated, referrals for preventative services and immunizations addressed, dietary and smoking counseling addressed, most recent labs reviewed.  I have personally reviewed and have noted:   1) the patient's medical and social history 2) The pt's use of alcohol, tobacco, and illicit drugs 3) The patient's current medications and supplements 4) Functional ability including ADL's, fall risk, home safety risk, hearing and visual impairment 5) Diet and physical activities 6) Evidence for depression or mood disorder 7) The patient's height, weight, and BMI have been recorded in the chart   I have made referrals, and provided counseling and education based on review of the above       Hematuria    Recurrent,  patient unclear if source is bladder or vagina .  S/p remote hysterectomy,  recent hemicolectomy (Sept 2023). No histor y of nephrolithiasis.  Checking urine today,  will schedule a pelvic as her GYN appt is not until dec 27      Relevant Orders   Urinalysis, Routine w reflex microscopic (Completed)   Urine Culture   Hyperlipidemia, mixed   Relevant Orders   Comp Met (CMET) (Completed)   Lipid Profile (Completed)   Insomnia, persistent    Advised to add trial of trazodone as alternative therapy       Osteopenia    Reviewed Dec 2021 DEXA with patient.    Her ten year risk of hip fracture and 10 year risk of any fracture is well below the recommended treatment threshhold , so no medications are advised at this time.  Continue  goal calcium intake of 1200 mg daily  through diet and supplements and 1000 Ius of Vitamin D3 daily unless otherwise recommended. Recommend that we repeat the DEXA in 3 years.       Unspecified adrenocortical insufficiency ([facility])   Weight loss     I have reviewed her diet and recommended that she increase her protein and fat intake while monitoring her carbohydrates.        Other Visit Diagnoses     Anemia, unspecified type       Relevant Orders   CBC w/Diff (Completed)   B12 and Folate Panel (Completed)   IBC + Ferritin (Completed)   Impaired fasting glucose       Relevant Orders   HgB A1c (Completed)       I have discontinued Mardene Celeste C. Goines "[name]"'s neomycin and cefdinir. I am also having her maintain her Vitamin D3, sodium chloride, fluticasone, ondansetron, atorvastatin, acetaminophen, Magnesium, Fluad Quadrivalent, and zolpidem.    I provided 40 minutes of  face-to-face time during this encounter reviewing patient's current problems and past surgeries,  recent labs and imaging studies, providing counseling on the above mentioned problems , and  coordination  of care .   Follow-up: No follow-ups on file.   Crecencio Mc, MD

## 2022-08-28 NOTE — Assessment & Plan Note (Signed)
Advised to add trial of trazodone as alternative therapy

## 2022-08-28 NOTE — Assessment & Plan Note (Signed)
Reviewed Dec 2021 DEXA with patient.    Her ten year risk of hip fracture and 10 year risk of any fracture is well below the recommended treatment threshhold , so no medications are advised at this time.  Continue  goal calcium intake of 1200 mg daily through diet and supplements and 1000 Ius of Vitamin D3 daily unless otherwise recommended. Recommend that we repeat the DEXA in 3 years.

## 2022-08-28 NOTE — Assessment & Plan Note (Signed)
Recurrent,  patient unclear if source is bladder or vagina .  S/p remote hysterectomy,  recent hemicolectomy (Sept 2023). No histor y of nephrolithiasis.  Checking urine today,  will schedule a pelvic as her GYN appt is not until dec 27

## 2022-08-28 NOTE — Patient Instructions (Addendum)
You have lost 6 lbs since your surgery. This is not good.  You need to add more protein to your diet.   Consider a premixed protein drink called Premier Protein shake to supplement your daily intake  . It is great tasting,   very low sugar and available of < $2 serving at [facility] and  In bulk for $1.50/serving at [employer] and [employer]  .    Nutritional analysis :  160 cal  30 g protein  1 g sugar 50% calcium needs   [employer]lad[employer] Faster and [employer]   For the insomnia:  the sooner you wean yourself from daily use of ambien,  the better!  You might want to try using Relaxium for insomnia  (as seen on TV commercials) . It is available through [employer] and contains all natural supplements:  Melatonin 5 mg  Chamomile 25 mg Passionflower extract 75 mg GABA 100 mg Ashwaganda extract 125 mg Magnesium citrate, glycinate, oxide (100 mg)  L tryptophan 500 mg Valerest (proprietary  ingredient ; probably valeria root extract)

## 2022-08-28 NOTE — Assessment & Plan Note (Signed)

## 2022-08-29 ENCOUNTER — Other Ambulatory Visit: Payer: Self-pay

## 2022-08-29 LAB — URINE CULTURE
MICRO NUMBER:: 14246212
Result:: NO GROWTH
SPECIMEN QUALITY:: ADEQUATE

## 2022-08-30 ENCOUNTER — Telehealth: Payer: Self-pay

## 2022-08-30 NOTE — Telephone Encounter (Signed)
LVM to call back to go over results 

## 2022-09-02 ENCOUNTER — Telehealth: Payer: Self-pay

## 2022-09-02 DIAGNOSIS — E538 Deficiency of other specified B group vitamins: Secondary | ICD-10-CM

## 2022-09-02 NOTE — Telephone Encounter (Signed)
LMTCB

## 2022-09-02 NOTE — Telephone Encounter (Signed)
Patient states Dr. Deborra Medina asked her to have B12 injections three times per week.  Patient states she would like to see if she can have these done at her pharmacy which is closer for her.  Patient has scheduled her first one with [place] on 09/03/2022 at 4pm.  *Patient states her preferred pharmacy is the [facility].

## 2022-09-03 ENCOUNTER — Ambulatory Visit (INDEPENDENT_AMBULATORY_CARE_PROVIDER_SITE_OTHER): Payer: 59

## 2022-09-03 DIAGNOSIS — D649 Anemia, unspecified: Secondary | ICD-10-CM

## 2022-09-03 MED ORDER — CYANOCOBALAMIN 1000 MCG/ML IJ SOLN
1000.0000 ug | Freq: Once | INTRAMUSCULAR | Status: AC
  Administered 2022-09-03: 1000 ug via INTRAMUSCULAR

## 2022-09-03 NOTE — Progress Notes (Signed)
Pt presented for their vitamin B12 injection. Pt was identified through two identifiers. Pt tolerated shot well in their right deltoid. Pt stated she would like the vitamin  B12 sent to [place] [facility] so she can get the B12 shots done at that pharmacy because it is closer to her versus the office. Pt is aware that she is supposed to get three shots this week. I had pt schedule her next one for here until she heard from the office if the B12 was sent to [facility] or not.

## 2022-09-03 NOTE — Telephone Encounter (Signed)
Pt has herr weekly injections scheduled for here at the office.

## 2022-09-05 ENCOUNTER — Ambulatory Visit (INDEPENDENT_AMBULATORY_CARE_PROVIDER_SITE_OTHER): Payer: 59

## 2022-09-05 ENCOUNTER — Other Ambulatory Visit: Payer: Self-pay

## 2022-09-05 ENCOUNTER — Other Ambulatory Visit: Payer: 59

## 2022-09-05 DIAGNOSIS — E538 Deficiency of other specified B group vitamins: Secondary | ICD-10-CM | POA: Diagnosis not present

## 2022-09-05 MED ORDER — CYANOCOBALAMIN 1000 MCG/ML IJ SOLN
1000.0000 ug | INTRAMUSCULAR | 1 refills | Status: DC
  Filled 2022-09-05: qty 2, 14d supply, fill #0

## 2022-09-05 MED ORDER — "SYRINGE 25G X 1"" 3 ML MISC"
0 refills | Status: DC
  Filled 2022-09-05: qty 2, fill #0
  Filled 2022-09-25: qty 2, 30d supply, fill #0

## 2022-09-05 MED ORDER — CYANOCOBALAMIN 1000 MCG/ML IJ SOLN
1000.0000 ug | Freq: Once | INTRAMUSCULAR | Status: AC
  Administered 2022-09-05: 1000 ug via INTRAMUSCULAR

## 2022-09-05 NOTE — Progress Notes (Signed)
Patient presented for B 12 injection to left deltoid, patient voiced no concerns nor showed any signs of distress during injection. 

## 2022-09-05 NOTE — Telephone Encounter (Signed)
Pt came back in today for a second b12 injection thinking that she was supposed to have them done three times in one week. CMA doing the nurse visits had already given pt the second injection. I went in and explained to pt that was not correct that it should be one injection per week for 3 weeks. Also explained to pt that it is not going to hurt her because you can not get to much b12 because your body gets rid of what it doesn't need through urination. Pt gave a verbal understanding. Pt also would like to have the last 2 weekly injections done over at the hospital by a nurse that she works with. Rx has been sent to pharmacy.

## 2022-09-09 ENCOUNTER — Ambulatory Visit
Admission: RE | Admit: 2022-09-09 | Discharge: 2022-09-09 | Disposition: A | Discharge status: Home / Self Care | Payer: 59 | Source: Ambulatory Visit | Attending: Internal Medicine | Admitting: Internal Medicine

## 2022-09-09 DIAGNOSIS — Z1231 Encounter for screening mammogram for malignant neoplasm of breast: Secondary | ICD-10-CM | POA: Diagnosis not present

## 2022-09-10 LAB — INTRINSIC FACTOR ANTIBODIES: Intrinsic Factor: POSITIVE — AB

## 2022-09-25 ENCOUNTER — Other Ambulatory Visit: Payer: Self-pay

## 2022-09-25 ENCOUNTER — Other Ambulatory Visit: Payer: Self-pay | Admitting: Internal Medicine

## 2022-09-25 MED ORDER — CYANOCOBALAMIN 1000 MCG/ML IJ SOLN
1000.0000 ug | INTRAMUSCULAR | 0 refills | Status: DC
  Filled 2022-09-25: qty 1, 30d supply, fill #0
  Filled 2022-10-24: qty 1, 30d supply, fill #1
  Filled 2022-11-20 (×2): qty 1, 30d supply, fill #2
  Filled 2022-12-18: qty 1, 30d supply, fill #3
  Filled 2023-01-29: qty 1, 30d supply, fill #4
  Filled 2023-02-21 – 2023-02-25 (×2): qty 1, 30d supply, fill #5
  Filled 2023-04-14: qty 1, 30d supply, fill #6
  Filled 2023-05-20: qty 1, 30d supply, fill #7
  Filled 2023-06-25: qty 1, 30d supply, fill #8

## 2022-09-26 ENCOUNTER — Other Ambulatory Visit: Payer: Self-pay

## 2022-10-02 ENCOUNTER — Other Ambulatory Visit: Payer: Self-pay

## 2022-10-02 DIAGNOSIS — L0109 Other impetigo: Secondary | ICD-10-CM | POA: Diagnosis not present

## 2022-10-02 MED ORDER — MUPIROCIN 2 % EX OINT
1.0000 | TOPICAL_OINTMENT | Freq: Three times a day (TID) | CUTANEOUS | 0 refills | Status: DC
  Filled 2022-10-02: qty 22, 30d supply, fill #0

## 2022-10-02 MED ORDER — DOXYCYCLINE HYCLATE 100 MG PO TABS
100.0000 mg | ORAL_TABLET | Freq: Two times a day (BID) | ORAL | 0 refills | Status: DC
  Filled 2022-10-02: qty 14, 7d supply, fill #0

## 2022-10-22 ENCOUNTER — Other Ambulatory Visit: Payer: Self-pay

## 2022-10-24 ENCOUNTER — Other Ambulatory Visit: Payer: Self-pay

## 2022-11-07 ENCOUNTER — Other Ambulatory Visit: Payer: Self-pay

## 2022-11-07 MED ORDER — PREDNISONE 10 MG PO TABS
ORAL_TABLET | ORAL | 0 refills | Status: AC
  Filled 2022-11-07: qty 21, 6d supply, fill #0

## 2022-11-08 ENCOUNTER — Other Ambulatory Visit: Payer: Self-pay

## 2022-11-20 ENCOUNTER — Other Ambulatory Visit: Payer: Self-pay

## 2022-11-20 ENCOUNTER — Telehealth: Payer: Self-pay | Admitting: Internal Medicine

## 2022-11-20 MED ORDER — ONDANSETRON 4 MG PO TBDP
4.0000 mg | ORAL_TABLET | Freq: Three times a day (TID) | ORAL | 0 refills | Status: DC | PRN
  Filled 2022-11-20: qty 20, 7d supply, fill #0

## 2022-11-20 NOTE — Telephone Encounter (Signed)
refilled 

## 2022-11-20 NOTE — Telephone Encounter (Signed)
Pt called stating she need a refill on zofran sent to [facility]

## 2022-11-21 ENCOUNTER — Other Ambulatory Visit: Payer: Self-pay

## 2022-12-18 ENCOUNTER — Other Ambulatory Visit: Payer: Self-pay

## 2022-12-19 ENCOUNTER — Other Ambulatory Visit: Payer: Self-pay

## 2022-12-20 DIAGNOSIS — H903 Sensorineural hearing loss, bilateral: Secondary | ICD-10-CM | POA: Diagnosis not present

## 2023-01-10 ENCOUNTER — Telehealth: Payer: Self-pay | Admitting: Internal Medicine

## 2023-01-10 ENCOUNTER — Other Ambulatory Visit: Payer: Self-pay

## 2023-01-10 MED ORDER — ZOLPIDEM TARTRATE ER 12.5 MG PO TBCR
12.5000 mg | EXTENDED_RELEASE_TABLET | Freq: Every evening | ORAL | 0 refills | Status: DC | PRN
  Filled 2023-01-10: qty 30, 30d supply, fill #0

## 2023-01-10 NOTE — Telephone Encounter (Signed)
Pt called stating she is going on vacation and need just three or four pills to last her until she gets back sent to [facility]

## 2023-01-10 NOTE — Telephone Encounter (Signed)
LMTCB

## 2023-01-10 NOTE — Addendum Note (Signed)
Addended by: Sherlene Shams on: 01/10/2023 04:19 PM   Modules accepted: Orders

## 2023-01-10 NOTE — Telephone Encounter (Signed)
She has an active prescription, last filled March 21.  Why is she requesting it when she is not due?

## 2023-01-10 NOTE — Telephone Encounter (Signed)
Pt would like to see if she could get like 3 or 4 Ambien to take with her on vacation in case she is unable to sleep.

## 2023-01-10 NOTE — Telephone Encounter (Signed)
Pt stated that she will be out before she returns from vacation.

## 2023-01-13 NOTE — Telephone Encounter (Signed)
Pt is aware and gave a verbal understanding.  

## 2023-01-29 ENCOUNTER — Other Ambulatory Visit: Payer: Self-pay

## 2023-02-07 ENCOUNTER — Other Ambulatory Visit: Payer: Self-pay | Admitting: Internal Medicine

## 2023-02-09 ENCOUNTER — Other Ambulatory Visit: Payer: Self-pay | Admitting: Internal Medicine

## 2023-02-09 ENCOUNTER — Other Ambulatory Visit: Payer: Self-pay

## 2023-02-10 MED FILL — Zolpidem Tartrate Tab ER 12.5 MG: ORAL | 30 days supply | Qty: 30 | Fill #0 | Status: AC

## 2023-02-11 ENCOUNTER — Other Ambulatory Visit: Payer: Self-pay

## 2023-02-12 ENCOUNTER — Other Ambulatory Visit: Payer: Self-pay

## 2023-02-14 ENCOUNTER — Other Ambulatory Visit: Payer: Self-pay

## 2023-02-20 ENCOUNTER — Telehealth: Payer: Self-pay

## 2023-02-20 ENCOUNTER — Encounter: Payer: Self-pay | Admitting: Internal Medicine

## 2023-02-20 ENCOUNTER — Other Ambulatory Visit: Payer: Self-pay

## 2023-02-20 ENCOUNTER — Ambulatory Visit: Payer: Commercial Managed Care - PPO | Admitting: Internal Medicine

## 2023-02-20 VITALS — BP 118/62 | HR 71 | Wt 111.0 lb

## 2023-02-20 DIAGNOSIS — D649 Anemia, unspecified: Secondary | ICD-10-CM

## 2023-02-20 DIAGNOSIS — E538 Deficiency of other specified B group vitamins: Secondary | ICD-10-CM | POA: Diagnosis not present

## 2023-02-20 DIAGNOSIS — R635 Abnormal weight gain: Secondary | ICD-10-CM | POA: Diagnosis not present

## 2023-02-20 DIAGNOSIS — G4700 Insomnia, unspecified: Secondary | ICD-10-CM

## 2023-02-20 DIAGNOSIS — Z9049 Acquired absence of other specified parts of digestive tract: Secondary | ICD-10-CM | POA: Insufficient documentation

## 2023-02-20 DIAGNOSIS — M8580 Other specified disorders of bone density and structure, unspecified site: Secondary | ICD-10-CM | POA: Diagnosis not present

## 2023-02-20 DIAGNOSIS — E782 Mixed hyperlipidemia: Secondary | ICD-10-CM | POA: Diagnosis not present

## 2023-02-20 DIAGNOSIS — K5720 Diverticulitis of large intestine with perforation and abscess without bleeding: Secondary | ICD-10-CM | POA: Diagnosis not present

## 2023-02-20 LAB — COMPREHENSIVE METABOLIC PANEL
ALT: 16 U/L (ref 0–35)
AST: 20 U/L (ref 0–37)
Albumin: 4.1 g/dL (ref 3.5–5.2)
Alkaline Phosphatase: 56 U/L (ref 39–117)
BUN: 18 mg/dL (ref 6–23)
CO2: 28 mEq/L (ref 19–32)
Calcium: 9.3 mg/dL (ref 8.4–10.5)
Chloride: 104 mEq/L (ref 96–112)
Creatinine, Ser: 0.8 mg/dL (ref 0.40–1.20)
GFR: 71.95 mL/min (ref >60.00)
Glucose, Bld: 84 mg/dL (ref 70–99)
Potassium: 4.1 mEq/L (ref 3.5–5.1)
Sodium: 138 mEq/L (ref 135–145)
Total Bilirubin: 0.7 mg/dL (ref 0.2–1.2)
Total Protein: 7 g/dL (ref 6.0–8.3)

## 2023-02-20 LAB — LIPID PANEL
Cholesterol: 225 mg/dL — ABNORMAL HIGH (ref 0–200)
HDL: 72.6 mg/dL (ref >39.00)
LDL Cholesterol: 138 mg/dL — ABNORMAL HIGH (ref 0–99)
NonHDL: 152.72
Total CHOL/HDL Ratio: 3
Triglycerides: 76 mg/dL (ref 0.0–149.0)
VLDL: 15.2 mg/dL (ref 0.0–40.0)

## 2023-02-20 LAB — TSH: TSH: 1.53 u[IU]/mL (ref 0.35–5.50)

## 2023-02-20 LAB — VITAMIN B12: Vitamin B-12: >1500 pg/mL — ABNORMAL HIGH (ref 211–911)

## 2023-02-20 LAB — LDL CHOLESTEROL, DIRECT: Direct LDL: 141 mg/dL

## 2023-02-20 MED ORDER — ONDANSETRON 4 MG PO TBDP
4.0000 mg | ORAL_TABLET | Freq: Three times a day (TID) | ORAL | 5 refills | Status: DC | PRN
  Filled 2023-02-20: qty 20, 7d supply, fill #0
  Filled 2023-04-15: qty 20, 7d supply, fill #1
  Filled 2023-06-16: qty 20, 7d supply, fill #2

## 2023-02-20 MED ORDER — ZOLPIDEM TARTRATE ER 12.5 MG PO TBCR
12.5000 mg | EXTENDED_RELEASE_TABLET | Freq: Every evening | ORAL | 5 refills | Status: DC | PRN
Start: 2023-02-20 — End: 2023-09-22
  Filled 2023-02-20 – 2023-03-14 (×3): qty 30, 30d supply, fill #0
  Filled 2023-04-14: qty 30, 30d supply, fill #1
  Filled 2023-05-10: qty 30, 30d supply, fill #2
  Filled 2023-06-13: qty 30, 30d supply, fill #3
  Filled 2023-07-22: qty 30, 30d supply, fill #4
  Filled 2023-08-19: qty 30, 30d supply, fill #5

## 2023-02-20 MED ORDER — ROSUVASTATIN CALCIUM 10 MG PO TABS
10.0000 mg | ORAL_TABLET | ORAL | 3 refills | Status: DC
  Filled 2023-02-20: qty 45, 90d supply, fill #0

## 2023-02-20 NOTE — Progress Notes (Signed)
Subjective:  Patient ID: Jean Brooks, female    DOB: Sep 20, 1946  Age: 77 y.o. MRN: [id number]  CC: The primary encounter diagnosis was Hyperlipidemia, mixed. Diagnoses of Osteopenia, unspecified location, History of partial colectomy, B12 deficiency, Weight gain, Normocytic anemia, Insomnia, persistent, and Diverticulitis of large intestine with perforation and abscess without bleeding were also pertinent to this visit.   HPI Jean Brooks presents for  Chief Complaint  Patient presents with   Medical Management of Chronic Issues    1) aortic atherosclerosis:  Reviewed findings of prior CT scan today..  Patient is not  tolerating atorvastatin due to recurrent nausea.  Willing to try  crestor qod   2) Insomnia:  she remains ambient dependent after failed trials of trazodone and melatonin . Still waking yp afer 4-5 hours of sleep despite using ambien CR. Marland Kitchen Denies alcohol use, anxiety,  and more than one caffeinated beverage   3) B12 deficiency:  diagnosed In the fall.  Positive intrinsic factor ab.  She has completed the 4 weekly injections and now getting injections monthly at work from one of the nueses.   4)   constipation . Not using a BFL daily despite   5) h/o sigmoid colectomy 2/2 recurrent diverticulitis .  Outpatient Medications Prior to Visit  Medication Sig Dispense Refill   acetaminophen (TYLENOL) 500 MG tablet Take 500 mg by mouth every 6 (six) hours as needed.     Cholecalciferol (VITAMIN D3) 1000 UNITS CAPS Take 1 capsule by mouth daily at 6 (six) AM. Alternate with the magnesium for leg pain     cyanocobalamin (VITAMIN B12) 1000 MCG/ML injection Inject 1 mL (1,000 mcg total) into the muscle every 30 (thirty) days. 10 mL 0   fluticasone (FLONASE) 50 MCG/ACT nasal spray Place 2 sprays into both nostrils daily as needed for allergies or rhinitis. 16 g 6   influenza vaccine adjuvanted (FLUAD QUADRIVALENT) 0.5 ML injection Inject into the muscle. 0.5 mL 0   Magnesium  400 MG CAPS Take 1 tablet by mouth as needed (leg cramps).     mupirocin ointment (BACTROBAN) 2 % Apply 1 Application topically 3 (three) times daily. 22 g 0   sodium chloride (MURO 128) 5 % ophthalmic ointment Place 1 application into the right eye at bedtime.     Syringe/Needle, Disp, (SYRINGE 3CC/25GX1") 25G X 1" 3 ML MISC Use to give b12 injection 2 each 0   ondansetron (ZOFRAN-ODT) 4 MG disintegrating tablet Take 1 tablet (4 mg total) by mouth every 8 (eight) hours as needed for nausea or vomiting. 20 tablet 0   zolpidem (AMBIEN CR) 12.5 MG CR tablet Take 1 tablet (12.5 mg total) by mouth at bedtime as needed. 30 tablet 0   atorvastatin (LIPITOR) 20 MG tablet Take 1 tablet (20 mg total) by mouth 2 (two) times a week. (Patient not taking: Reported on 02/20/2023) 24 tablet 2   doxycycline (VIBRA-TABS) 100 MG tablet Take one tablet by mouth twice a day for 7 days 14 tablet 0   No facility-administered medications prior to visit.    Review of Systems;  Patient denies headache, fevers, malaise, unintentional weight loss, skin rash, eye pain, sinus congestion and sinus pain, sore throat, dysphagia,  hemoptysis , cough, dyspnea, wheezing, chest pain, palpitations, orthopnea, edema, abdominal pain, nausea, melena, diarrhea, constipation, flank pain, dysuria, hematuria, urinary  Frequency, nocturia, numbness, tingling, seizures,  Focal weakness, Loss of consciousness,  Tremor, insomnia, depression, anxiety, and suicidal ideation.  Objective:  BP 118/62   Pulse 71   Wt 111 lb (50.3 kg)   SpO2 98%   BMI 19.66 kg/m   BP Readings from Last 3 Encounters:  02/20/23 118/62  08/28/22 120/68  08/09/22 (!) 151/72    Wt Readings from Last 3 Encounters:  02/20/23 111 lb (50.3 kg)  08/28/22 105 lb 6.4 oz (47.8 kg)  06/18/22 105 lb (47.6 kg)    Physical Exam Vitals reviewed.  Constitutional:      General: She is not in acute distress.    Appearance: Normal appearance. She is normal weight.  She is not ill-appearing, toxic-appearing or diaphoretic.  HENT:     Head: Normocephalic.  Eyes:     General: No scleral icterus.       Right eye: No discharge.        Left eye: No discharge.     Conjunctiva/sclera: Conjunctivae normal.  Cardiovascular:     Rate and Rhythm: Normal rate and regular rhythm.     Heart sounds: Normal heart sounds.  Pulmonary:     Effort: Pulmonary effort is normal. No respiratory distress.     Breath sounds: Normal breath sounds.  Musculoskeletal:        General: Normal range of motion.  Skin:    General: Skin is warm and dry.  Neurological:     General: No focal deficit present.     Mental Status: She is alert and oriented to person, place, and time. Mental status is at baseline.  Psychiatric:        Mood and Affect: Mood normal.        Behavior: Behavior normal.        Thought Content: Thought content normal.        Judgment: Judgment normal.   Lab Results  Component Value Date   HGBA1C 5.9 08/28/2022   HGBA1C 6.0 08/21/2021   HGBA1C 5.7 05/21/2016    Lab Results  Component Value Date   CREATININE 0.80 02/20/2023   CREATININE 0.86 08/28/2022   CREATININE 0.89 06/19/2022    Lab Results  Component Value Date   WBC 4.0 08/28/2022   HGB 13.5 08/28/2022   HCT 39.6 08/28/2022   PLT 238.0 08/28/2022   GLUCOSE 84 02/20/2023   CHOL 225 (H) 02/20/2023   TRIG 76.0 02/20/2023   HDL 72.60 02/20/2023   LDLDIRECT 141.0 02/20/2023   LDLCALC 138 (H) 02/20/2023   ALT 16 02/20/2023   AST 20 02/20/2023   NA 138 02/20/2023   K 4.1 02/20/2023   CL 104 02/20/2023   CREATININE 0.80 02/20/2023   BUN 18 02/20/2023   CO2 28 02/20/2023   TSH 1.53 02/20/2023   HGBA1C 5.9 08/28/2022    MM 3D SCREEN BREAST BILATERAL  Result Date: 09/10/2022 CLINICAL DATA:  Screening. EXAM: DIGITAL SCREENING BILATERAL MAMMOGRAM WITH TOMOSYNTHESIS AND CAD TECHNIQUE: Bilateral screening digital craniocaudal and mediolateral oblique mammograms were obtained. Bilateral  screening digital breast tomosynthesis was performed. The images were evaluated with computer-aided detection. COMPARISON:  Previous exam(s). ACR Breast Density Category b: There are scattered areas of fibroglandular density. FINDINGS: There are no findings suspicious for malignancy. IMPRESSION: No mammographic evidence of malignancy. A result letter of this screening mammogram will be mailed directly to the patient. RECOMMENDATION: Screening mammogram in one year. (Code:SM-B-01Y) BI-RADS CATEGORY  1: Negative. Electronically Signed   By: Meda Klinefelter M.D.   On: 09/10/2022 14:50    Assessment & Plan:  .Hyperlipidemia, mixed -     Lipid panel -  LDL cholesterol, direct -     Comprehensive metabolic panel  Osteopenia, unspecified location Assessment & Plan: Reviewed Dec 2021 DEXA with patient.    Her ten year risk of hip fracture and 10 year risk of any fracture is well below the recommended treatment threshhold , so no medications are advised at this time.  Continue  goal calcium intake of 1200 mg daily through diet and supplements and 1000 Ius of Vitamin D3 daily unless otherwise recommended. Recommend that we repeat the DEXA   this year.   Orders: -     DG Bone Density; Future  History of partial colectomy -     Zolpidem Tartrate ER; Take 1 tablet (12.5 mg total) by mouth at bedtime as needed.  Dispense: 30 tablet; Refill: 5  B12 deficiency -     Vitamin B12  Weight gain -     TSH  Normocytic anemia Assessment & Plan: Workup revealed only a low b12.   Hgb has normalized   Lab Results  Component Value Date   WBC 4.0 08/28/2022   HGB 13.5 08/28/2022   HCT 39.6 08/28/2022   MCV 87.1 08/28/2022   PLT 238.0 08/28/2022      Insomnia, persistent Assessment & Plan: Has been dependent on ambien cr for years BUT IS ONLY AVERAGING 4-5 HOURS OF SLEEP before waking up  .  She did not tolerate previous trials of trazodone due to excessive sedation . Advised to add trial of  Relaxium (natural supplement)  as complementary therapy to increase her sleep period from 4-5 hours to 6-8    Diverticulitis of large intestine with perforation and abscess without bleeding Assessment & Plan: S/p partial sigmoid colectomy for history of diverticulitis with perforation ,  in Oct 2023.    Other orders -     Rosuvastatin Calcium; Take 1 tablet (10 mg total) by mouth every other day.  Dispense: 45 tablet; Refill: 3 -     Ondansetron; Take 1 tablet (4 mg total) by mouth every 8 (eight) hours as needed for nausea or vomiting.  Dispense: 20 tablet; Refill: 5     Follow-up: Return in about 6 months (around 08/23/2023).   Sherlene Shams, MD

## 2023-02-20 NOTE — Patient Instructions (Addendum)
  Your aortic atherosclerosis places you at increased risk for heart attack and stroke,  please Give Crestor a try,   If you tolerate taking  it every other day let me know       You might want to try  ADDING Relaxium for insomnia  (as seen on TV commercials) . It is available through [employer] and contains all natural supplements:  Melatonin 5 mg  Chamomile 25 mg Passionflower extract 75 mg GABA 100 mg Ashwaganda extract 125 mg Magnesium citrate, glycinate, oxide (100 mg)  L tryptophan 500 mg Valerest (proprietary  ingredient ; probably valeria root extract)      Try benefiber for the constipation  very gently OR: Colace works overnight  Magnesium (OVERNIGHT BUT NOT GENTLE)

## 2023-02-20 NOTE — Telephone Encounter (Signed)
At check-out, patient already has a physical scheduled just past six months from now.  Patient states Dr. Duncan Dull told her she can come in a couple of days before her physical to have her labs done.  I scheduled the lab appointment for patient.  Lab orders will need to be entered.

## 2023-02-21 ENCOUNTER — Other Ambulatory Visit: Payer: Self-pay

## 2023-02-21 NOTE — Assessment & Plan Note (Signed)
Workup revealed only a low b12.   Hgb has normalized   Lab Results  Component Value Date   WBC 4.0 08/28/2022   HGB 13.5 08/28/2022   HCT 39.6 08/28/2022   MCV 87.1 08/28/2022   PLT 238.0 08/28/2022

## 2023-02-21 NOTE — Assessment & Plan Note (Addendum)
S/p partial sigmoid colectomy for history of diverticulitis with perforation ,  in Oct 2023.

## 2023-02-21 NOTE — Assessment & Plan Note (Addendum)
Has been dependent on ambien cr for years BUT IS ONLY AVERAGING 4-5 HOURS OF SLEEP before waking up  .  She did not tolerate previous trials of trazodone due to excessive sedation . Advised to add trial of Relaxium (natural supplement)  as complementary therapy to increase her sleep period from 4-5 hours to 6-8

## 2023-02-21 NOTE — Assessment & Plan Note (Addendum)
Reviewed Dec 2021 DEXA with patient.    Her ten year risk of hip fracture and 10 year risk of any fracture is well below the recommended treatment threshhold , so no medications are advised at this time.  Continue  goal calcium intake of 1200 mg daily through diet and supplements and 1000 Ius of Vitamin D3 daily unless otherwise recommended. Recommend that we repeat the DEXA   this year.

## 2023-02-23 IMAGING — CT CT ABD-PELV W/ CM
2 of 5 series · 15 of 46 positions shown, 17 images · IV contrast (APPLIED)
Comparison: CT AP, 02/19/2022 and 12/04/2017.

CLINICAL DATA: began second round of antibiotics today for ongoing
diverticulitis. LLQ pain

EXAM:
CT ABDOMEN AND PELVIS WITH CONTRAST
TECHNIQUE: Multidetector CT imaging of the abdomen and pelvis was performed
using the standard protocol following bolus administration of
intravenous contrast.

[Series 2: routine abd/pel with · axial · 0.71mm/px · z∈[-358,+17]mm · 12 of 86 slices shown, 14 images]
[im 6/86  soft-tissue]
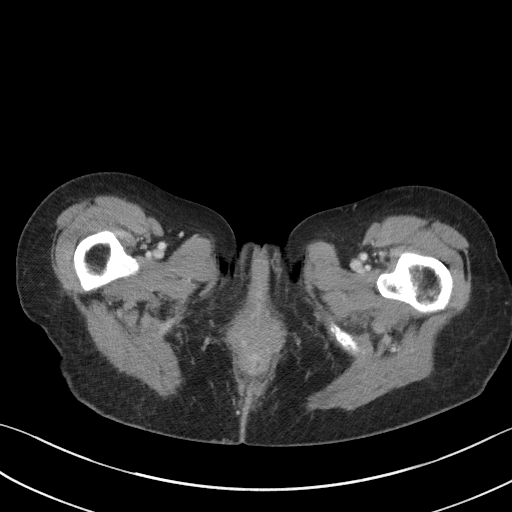
[im 6/86  bone]
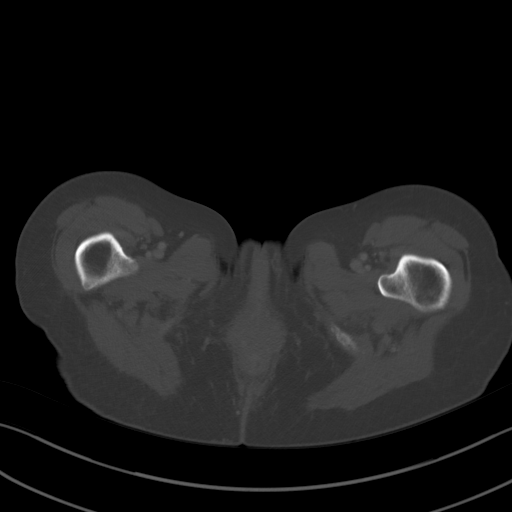
[im 16/86  soft-tissue]
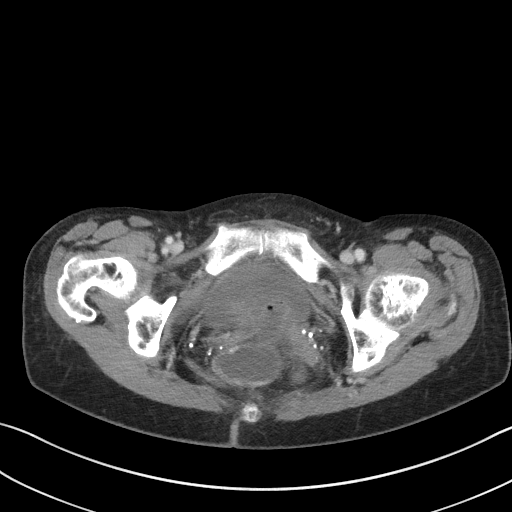
[im 21/86  soft-tissue]
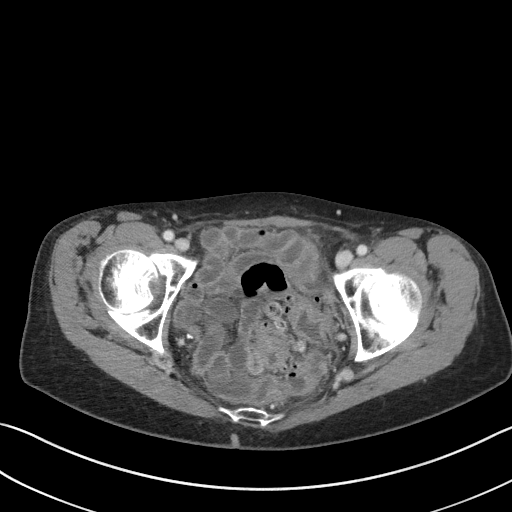
[im 26/86  soft-tissue]
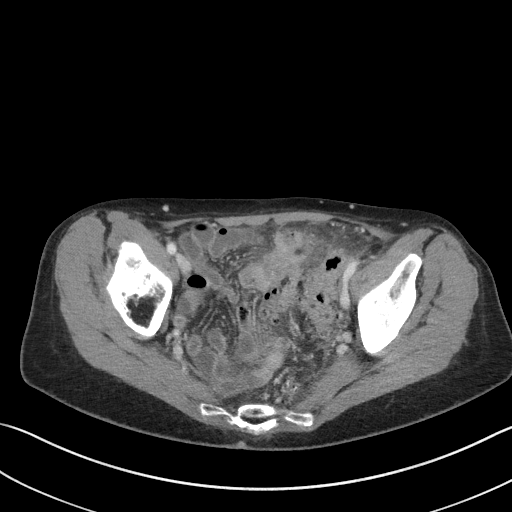
[im 36/86  soft-tissue]
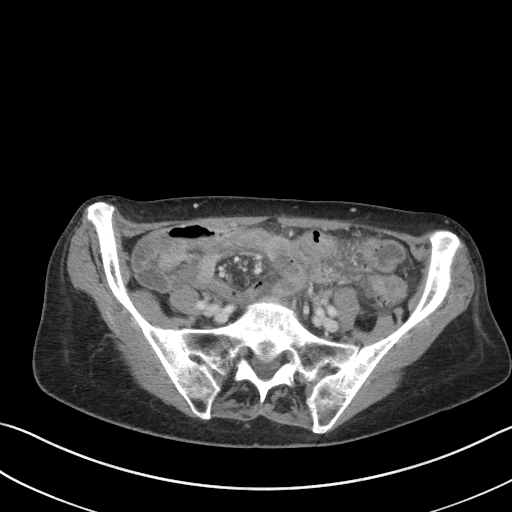
[im 41/86  soft-tissue]
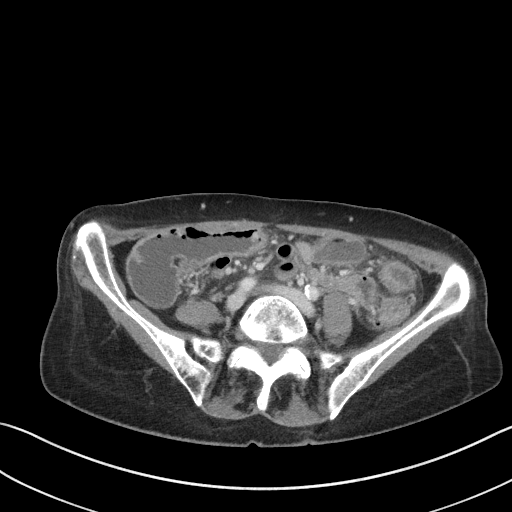
[im 46/86  soft-tissue]
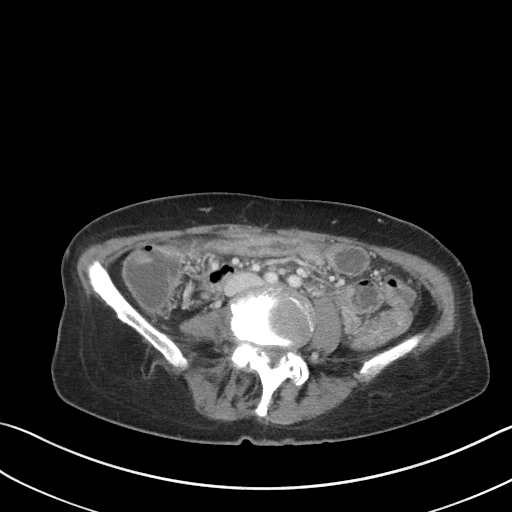
[im 56/86  soft-tissue]
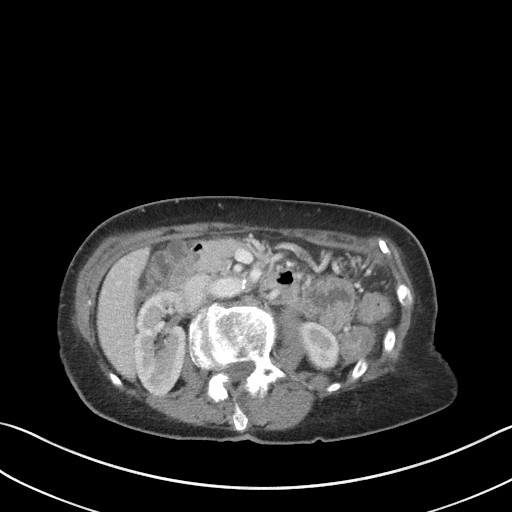
[im 61/86  soft-tissue]
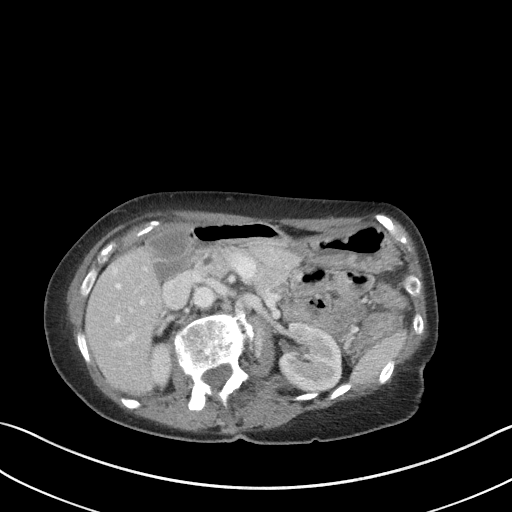
[im 61/86  bone]
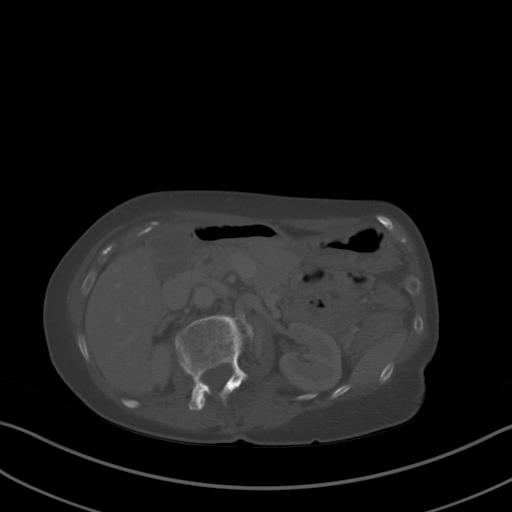
[im 66/86  soft-tissue]
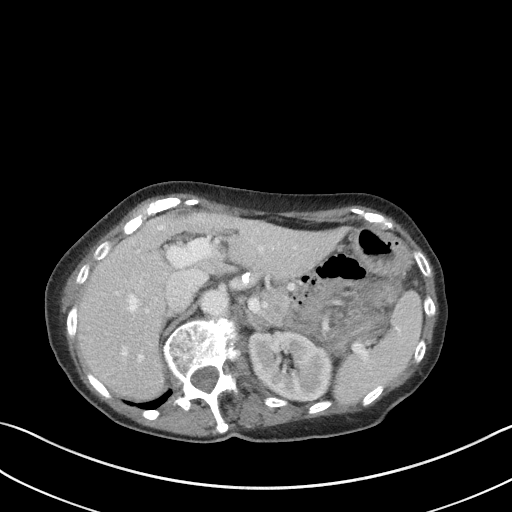
[im 76/86  soft-tissue]
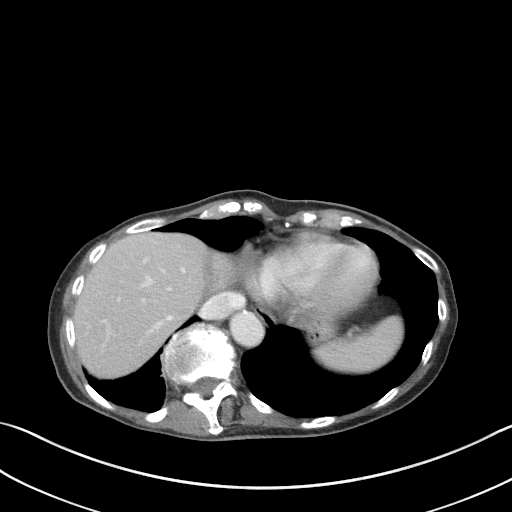
[im 81/86  soft-tissue]
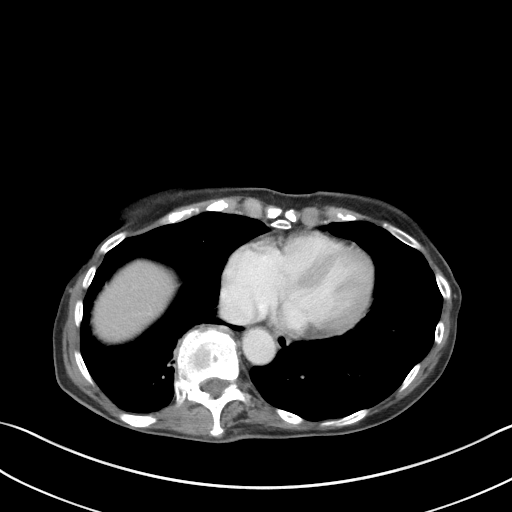

[Series 5: coronal st · coronal · 0.64mm/px · 3 of 68 slices shown]
[im 23/68  soft-tissue]
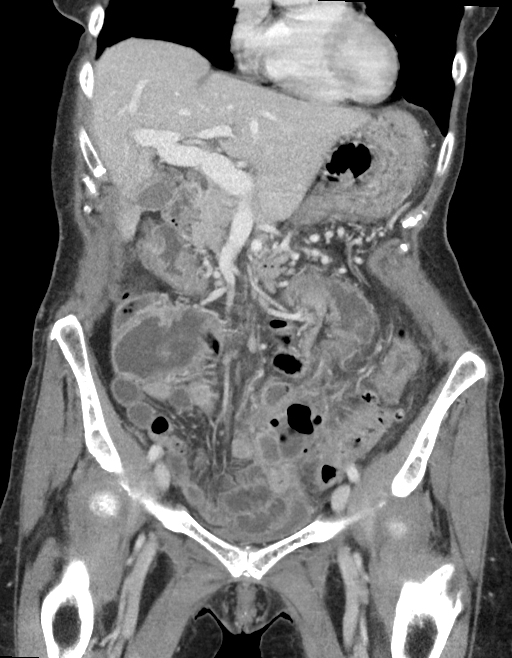
[im 30/68  soft-tissue]
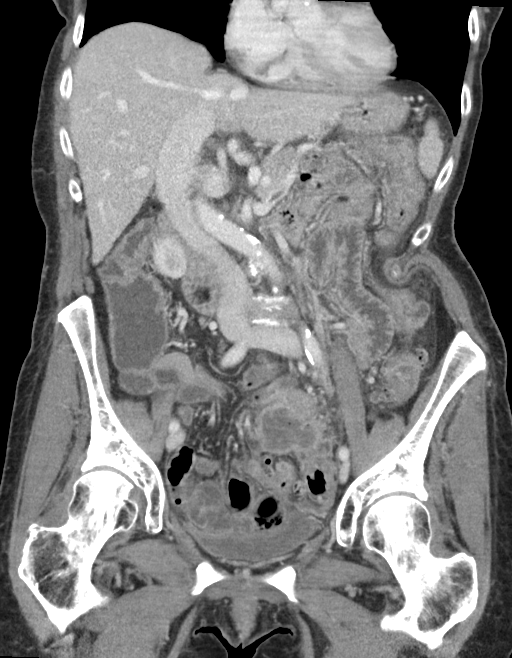
[im 38/68  soft-tissue]
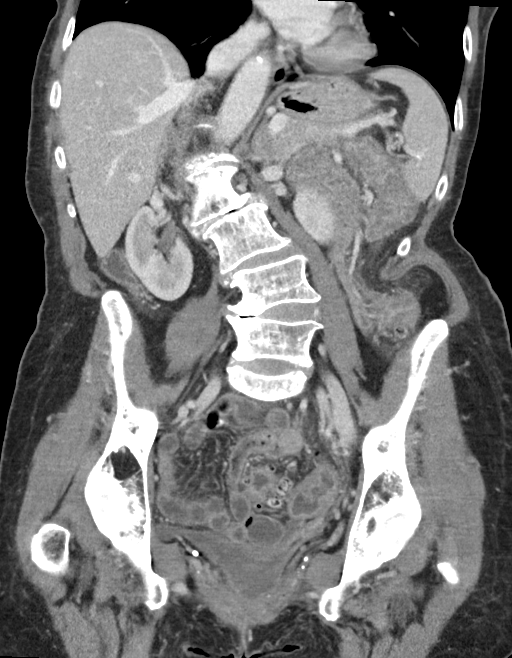

[15 of 46 positions shown; findings below may reference images not displayed]

RADIATION DOSE REDUCTION: This exam was performed according to the
departmental dose-optimization program which includes automated
exposure control, adjustment of the mA and/or kV according to
patient size and/or use of iterative reconstruction technique.

CONTRAST:  80mL OMNIPAQUE IOHEXOL 300 MG/ML  SOLN
FINDINGS: Lower chest: No acute abnormality.

Hepatobiliary: No focal liver abnormality is seen. No gallstones,
gallbladder wall thickening, or biliary dilatation.

Pancreas: No pancreatic ductal dilatation or surrounding
inflammatory changes.

Spleen: Normal in size. Intraparenchymal curvilinear calcification,
likely partially-calcified sub-0 cm splenic artery branch aneurysm.

Adrenals/Urinary Tract: Adrenal glands are unremarkable. 1.1 cm
hypodense LEFT superior renal pole renal cortical hypodense lesion,
incompletely characterized. Kidneys are otherwise normal, without
renal calculi or hydronephrosis. Bladder is unremarkable.

Stomach/Bowel: Stomach is within normal limits. Appendix is not
definitively visualized. Severe distal descending colon and sigmoid
diverticulosis. Nonobstructed small bowel. Nondilated colon.

Vascular/Lymphatic: Aortic atherosclerosis. No enlarged abdominal or
pelvic lymph nodes.

Reproductive: Status post hysterectomy. No adnexal mass.

Other: No abdominal wall hernia. No abdominopelvic ascites.

Progression of previously-noted air-fluid collection within the LEFT
lower quadrant, now multiloculated and with thickened enhancing
wall, measuring up to 5.5 x 4.5 x 4.0 cm (AP by transaxial by CC).
Previously up to 3.3 x 3.1 cm.

Musculoskeletal: Multilevel degenerative changes of the spine
including S-shaped scoliosis. No acute osseous findings.

No follow-up is indicated for incidental findings, unless
specifically mentioned.
IMPRESSION: Since CT AP dated 02/19/2022;

[DATE]. Progression of perforated, contained sigmoid diverticulitis with
enlargement of a now multiloculated abscess, measuring 5.5 cm
(previously 3.3 cm).
2. Aortic Atherosclerosis (UX1R5-6AM.M). Additional incidental,
chronic and senescent findings as above.

These results were conveyed in person at the time of interpretation
on 03/14/2022 at [DATE] to provider BLAIN JUMPER , who
verbally acknowledged these results.

## 2023-02-24 ENCOUNTER — Other Ambulatory Visit: Payer: Self-pay

## 2023-02-25 ENCOUNTER — Other Ambulatory Visit: Payer: Self-pay

## 2023-03-11 ENCOUNTER — Other Ambulatory Visit: Payer: Self-pay

## 2023-03-14 ENCOUNTER — Other Ambulatory Visit: Payer: Self-pay

## 2023-04-04 DIAGNOSIS — H6123 Impacted cerumen, bilateral: Secondary | ICD-10-CM | POA: Diagnosis not present

## 2023-04-04 DIAGNOSIS — H903 Sensorineural hearing loss, bilateral: Secondary | ICD-10-CM | POA: Diagnosis not present

## 2023-04-08 ENCOUNTER — Other Ambulatory Visit: Payer: Self-pay

## 2023-04-14 ENCOUNTER — Other Ambulatory Visit: Payer: Self-pay

## 2023-04-15 ENCOUNTER — Other Ambulatory Visit: Payer: Self-pay

## 2023-05-12 ENCOUNTER — Other Ambulatory Visit: Payer: Self-pay

## 2023-05-20 ENCOUNTER — Other Ambulatory Visit: Payer: Self-pay

## 2023-06-16 ENCOUNTER — Other Ambulatory Visit: Payer: Self-pay

## 2023-06-25 ENCOUNTER — Other Ambulatory Visit: Payer: Self-pay

## 2023-07-03 ENCOUNTER — Encounter: Payer: Self-pay | Admitting: Internal Medicine

## 2023-07-03 ENCOUNTER — Other Ambulatory Visit: Payer: Self-pay

## 2023-07-03 ENCOUNTER — Ambulatory Visit: Payer: Commercial Managed Care - PPO | Admitting: Internal Medicine

## 2023-07-03 VITALS — BP 108/60 | HR 79 | Temp 98.1°F | Ht 63.0 in | Wt 107.8 lb

## 2023-07-03 DIAGNOSIS — I700 Atherosclerosis of aorta: Secondary | ICD-10-CM | POA: Diagnosis not present

## 2023-07-03 DIAGNOSIS — E538 Deficiency of other specified B group vitamins: Secondary | ICD-10-CM | POA: Diagnosis not present

## 2023-07-03 DIAGNOSIS — F411 Generalized anxiety disorder: Secondary | ICD-10-CM | POA: Diagnosis not present

## 2023-07-03 DIAGNOSIS — Z789 Other specified health status: Secondary | ICD-10-CM | POA: Diagnosis not present

## 2023-07-03 DIAGNOSIS — F430 Acute stress reaction: Secondary | ICD-10-CM | POA: Diagnosis not present

## 2023-07-03 DIAGNOSIS — R634 Abnormal weight loss: Secondary | ICD-10-CM

## 2023-07-03 MED ORDER — CYANOCOBALAMIN 1000 MCG/ML IJ SOLN
1000.0000 ug | INTRAMUSCULAR | 0 refills | Status: DC
  Filled 2023-07-03: qty 10, 300d supply, fill #0
  Filled 2023-07-23: qty 3, 90d supply, fill #0
  Filled 2023-10-01: qty 3, 90d supply, fill #1

## 2023-07-03 MED ORDER — MIRTAZAPINE 15 MG PO TABS: ORAL_TABLET | ORAL | Status: DC

## 2023-07-03 NOTE — Assessment & Plan Note (Signed)
She had myalgaias with repeat trial  of crestor every other day

## 2023-07-03 NOTE — Progress Notes (Signed)
Subjective:  Patient ID: Jean Brooks, female    DOB: Oct 14, 1945  Age: 77 y.o. MRN: [id number]  CC: The primary encounter diagnosis was B12 deficiency. Diagnoses of Abnormal weight loss, Statin intolerance, Weight loss, Anxiety as acute reaction to exceptional stress, and Abdominal aortic atherosclerosis (HCC) were also pertinent to this visit.   HPI Jean Brooks presents for No chief complaint on file.  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  He was discharged home with PT/OT and she has been spending 2 days per week at home to provide assistance with daily activities,  with PT./OT,  transportation to doctor's appointments  and imaging appointments.   Her anxiety started  on Sept 9 when husband was admitted .  She has been unable to sleep due to concern about not being able to hear her husband if he is in distress.  She his having trouble  concentrating due to constant worry . She has had poor appetite .  She has had a unintentional weight loss of 4 lbs and would like to resume mirtazipine which was prescribed  in 2022 but not tolerated due to excessive sedation .  Outpatient Medications Prior to Visit  Medication Sig Dispense Refill   Syringe/Needle, Disp, (SYRINGE 3CC/25GX1") 25G X 1" 3 ML MISC Use to give b12 injection 2 each 0   zolpidem (AMBIEN CR) 12.5 MG CR tablet Take 1 tablet (12.5 mg total) by mouth at bedtime as needed. 30 tablet 5   cyanocobalamin (VITAMIN B12) 1000 MCG/ML injection Inject 1 mL (1,000 mcg total) into the muscle every 30 (thirty) days. 10 mL 0   acetaminophen (TYLENOL) 500 MG tablet Take 500 mg by mouth every 6 (six) hours as needed. (Patient not taking: Reported on 07/03/2023)     Cholecalciferol (VITAMIN D3) 1000 UNITS CAPS Take 1 capsule by mouth daily at 6 (six)  AM. Alternate with the magnesium for leg pain (Patient not taking: Reported on 07/03/2023)     fluticasone (FLONASE) 50 MCG/ACT nasal spray Place 2 sprays into both nostrils daily as needed for allergies or rhinitis. (Patient not taking: Reported on 07/03/2023) 16 g 6   Magnesium 400 MG CAPS Take 1 tablet by mouth as needed (leg cramps). (Patient not taking: Reported on 07/03/2023)     mupirocin ointment (BACTROBAN) 2 % Apply 1 Application topically 3 (three) times daily. (Patient not taking: Reported on 07/03/2023) 22 g 0   ondansetron (ZOFRAN-ODT) 4 MG disintegrating tablet Take 1 tablet (4 mg total) by mouth every 8 (eight) hours as needed for nausea or vomiting. (Patient not taking: Reported on 07/03/2023) 20 tablet 5   rosuvastatin (CRESTOR) 10 MG tablet Take 1 tablet (10 mg total) by mouth every other day. (Patient not taking: Reported on 07/03/2023) 45 tablet 3   sodium chloride (MURO 128) 5 % ophthalmic ointment Place 1 application into the right eye at bedtime. (Patient not taking: Reported on 07/03/2023)     influenza vaccine adjuvanted (FLUAD QUADRIVALENT) 0.5 ML injection Inject into the muscle. (Patient not taking: Reported on 07/03/2023) 0.5 mL 0   No facility-administered medications prior to visit.    Review of Systems;  Patient denies headache, fevers, malaise, unintentional weight loss, skin rash, eye pain, sinus congestion and sinus pain, sore throat, dysphagia,  hemoptysis ,  cough, dyspnea, wheezing, chest pain, palpitations, orthopnea, edema, abdominal pain, nausea, melena, diarrhea, constipation, flank pain, dysuria, hematuria, urinary  Frequency, nocturia, numbness, tingling, seizures,  Focal weakness, Loss of consciousness,  Tremor, insomnia, depression, anxiety, and suicidal ideation.      Objective:  BP 108/60   Pulse 79   Temp 98.1 F (36.7 C) (Oral)   Ht 5\' 3"  (1.6 m)   Wt 107 lb 12.8 oz (48.9 kg)   SpO2 96%   BMI 19.10 kg/m   BP Readings from Last 3 Encounters:   07/03/23 108/60  02/20/23 118/62  08/28/22 120/68    Wt Readings from Last 3 Encounters:  07/03/23 107 lb 12.8 oz (48.9 kg)  02/20/23 111 lb (50.3 kg)  08/28/22 105 lb 6.4 oz (47.8 kg)    Physical Exam  Lab Results  Component Value Date   HGBA1C 5.9 08/28/2022   HGBA1C 6.0 08/21/2021   HGBA1C 5.7 05/21/2016    Lab Results  Component Value Date   CREATININE 0.80 02/20/2023   CREATININE 0.86 08/28/2022   CREATININE 0.89 06/19/2022    Lab Results  Component Value Date   WBC 4.0 08/28/2022   HGB 13.5 08/28/2022   HCT 39.6 08/28/2022   PLT 238.0 08/28/2022   GLUCOSE 84 02/20/2023   CHOL 225 (H) 02/20/2023   TRIG 76.0 02/20/2023   HDL 72.60 02/20/2023   LDLDIRECT 141.0 02/20/2023   LDLCALC 138 (H) 02/20/2023   ALT 16 02/20/2023   AST 20 02/20/2023   NA 138 02/20/2023   K 4.1 02/20/2023   CL 104 02/20/2023   CREATININE 0.80 02/20/2023   BUN 18 02/20/2023   CO2 28 02/20/2023   TSH 1.53 02/20/2023   HGBA1C 5.9 08/28/2022    MM 3D SCREEN BREAST BILATERAL  Result Date: 09/10/2022 CLINICAL DATA:  Screening. EXAM: DIGITAL SCREENING BILATERAL MAMMOGRAM WITH TOMOSYNTHESIS AND CAD TECHNIQUE: Bilateral screening digital craniocaudal and mediolateral oblique mammograms were obtained. Bilateral screening digital breast tomosynthesis was performed. The images were evaluated with computer-aided detection. COMPARISON:  Previous exam(s). ACR Breast Density Category b: There are scattered areas of fibroglandular density. FINDINGS: There are no findings suspicious for malignancy. IMPRESSION: No mammographic evidence of malignancy. A result letter of this screening mammogram will be mailed directly to the patient. RECOMMENDATION: Screening mammogram in one year. (Code:SM-B-01Y) BI-RADS CATEGORY  1: Negative. Electronically Signed   By: Meda Klinefelter M.D.   On: 09/10/2022 14:50    Assessment & Plan:  .B12 deficiency  Abnormal weight loss -     Mirtazapine; TAKE 1/2 TO 1  TABLET BY MOUTH DAILY 30 MINUTES BEFORE BEDTIME  Statin intolerance Assessment & Plan: She had myalgaias with repeat trial  of crestor every other day    Weight loss Assessment & Plan:  I have reviewed her diet and recommended that she increase her protein and fat intake while monitoring her carbohydrates.  Resuming mirtazipine     Anxiety as acute reaction to exceptional stress Assessment & Plan: She has been unable to concentrate at work since her husband was hospitalized  and has reduced her work hours to 3 days per week    Abdominal aortic atherosclerosis (HCC) Assessment & Plan: Reviewed findings of prior CT scan today..  Patient was tolerating  20 mg crestor twice weekly  but since her husband's hospitlization she has stopped taking all of her medications .  Counselling given    Other orders -     Cyanocobalamin; Inject 1 mL (1,000 mcg total) into the  muscle every 30 (thirty) days.  Dispense: 10 mL; Refill: 0     I provided 30 minutes of face-to-face time during this encounter reviewing patient's last visit with me,  previous  labs and imaging studies, counseling on currently addressed issues,  and post visit ordering to diagnostics and therapeutics .   Follow-up: Return in about 4 weeks (around 07/31/2023).   Sherlene Shams, MD

## 2023-07-03 NOTE — Patient Instructions (Signed)
I will complete the FMLA for your anxiety/mood   Continue B12 injections monthly because you do not absorb it from food  RESUME MIRTAZIPINE BUT NOT AMBIEN   Return for fasting labs PRIOR TO YOUR FOLLOW UP IN ONE MONTH

## 2023-07-05 ENCOUNTER — Encounter: Payer: Self-pay | Admitting: Internal Medicine

## 2023-07-05 DIAGNOSIS — F411 Generalized anxiety disorder: Secondary | ICD-10-CM | POA: Insufficient documentation

## 2023-07-05 NOTE — Assessment & Plan Note (Signed)
I have reviewed her diet and recommended that she increase her protein and fat intake while monitoring her carbohydrates.  Resuming mirtazipine

## 2023-07-05 NOTE — Assessment & Plan Note (Signed)
She has been unable to concentrate at work since her husband was hospitalized  and has reduced her work hours to 3 days per week

## 2023-07-05 NOTE — Assessment & Plan Note (Addendum)
Reviewed findings of prior CT scan today..  Patient was tolerating  20 mg crestor twice weekly  but since her husband's hospitlization she has stopped taking all of her medications .  Counselling given

## 2023-07-07 DIAGNOSIS — Z0279 Encounter for issue of other medical certificate: Secondary | ICD-10-CM

## 2023-07-09 ENCOUNTER — Other Ambulatory Visit: Payer: Self-pay | Admitting: Internal Medicine

## 2023-07-09 DIAGNOSIS — Z1231 Encounter for screening mammogram for malignant neoplasm of breast: Secondary | ICD-10-CM

## 2023-07-21 ENCOUNTER — Other Ambulatory Visit: Payer: Commercial Managed Care - PPO

## 2023-07-21 DIAGNOSIS — E782 Mixed hyperlipidemia: Secondary | ICD-10-CM

## 2023-07-21 DIAGNOSIS — D649 Anemia, unspecified: Secondary | ICD-10-CM | POA: Diagnosis not present

## 2023-07-21 DIAGNOSIS — E538 Deficiency of other specified B group vitamins: Secondary | ICD-10-CM | POA: Diagnosis not present

## 2023-07-21 LAB — LIPID PANEL
Cholesterol: 219 mg/dL — ABNORMAL HIGH (ref 0–200)
HDL: 68.1 mg/dL (ref >39.00)
LDL Cholesterol: 137 mg/dL — ABNORMAL HIGH (ref 0–99)
NonHDL: 150.81
Total CHOL/HDL Ratio: 3
Triglycerides: 70 mg/dL (ref 0.0–149.0)
VLDL: 14 mg/dL (ref 0.0–40.0)

## 2023-07-21 LAB — COMPREHENSIVE METABOLIC PANEL WITH GFR
ALT: 12 U/L (ref 0–35)
AST: 17 U/L (ref 0–37)
Albumin: 4.3 g/dL (ref 3.5–5.2)
Alkaline Phosphatase: 51 U/L (ref 39–117)
BUN: 16 mg/dL (ref 6–23)
CO2: 29 meq/L (ref 19–32)
Calcium: 9.5 mg/dL (ref 8.4–10.5)
Chloride: 105 meq/L (ref 96–112)
Creatinine, Ser: 0.77 mg/dL (ref 0.40–1.20)
GFR: 75.11 mL/min
Glucose, Bld: 94 mg/dL (ref 70–99)
Potassium: 4 meq/L (ref 3.5–5.1)
Sodium: 142 meq/L (ref 135–145)
Total Bilirubin: 0.6 mg/dL (ref 0.2–1.2)
Total Protein: 6.9 g/dL (ref 6.0–8.3)

## 2023-07-21 LAB — CBC WITH DIFFERENTIAL/PLATELET
Basophils Absolute: 0 10*3/uL (ref 0.0–0.1)
Basophils Relative: 1.1 % (ref 0.0–3.0)
Eosinophils Absolute: 0.2 10*3/uL (ref 0.0–0.7)
Eosinophils Relative: 4.4 % (ref 0.0–5.0)
HCT: 40.9 % (ref 36.0–46.0)
Hemoglobin: 13.2 g/dL (ref 12.0–15.0)
Lymphocytes Relative: 27.7 % (ref 12.0–46.0)
Lymphs Abs: 1.2 10*3/uL (ref 0.7–4.0)
MCHC: 32.4 g/dL (ref 30.0–36.0)
MCV: 90.5 fL (ref 78.0–100.0)
Monocytes Absolute: 0.5 10*3/uL (ref 0.1–1.0)
Monocytes Relative: 10.4 % (ref 3.0–12.0)
Neutro Abs: 2.5 10*3/uL (ref 1.4–7.7)
Neutrophils Relative %: 56.4 % (ref 43.0–77.0)
Platelets: 243 10*3/uL (ref 150.0–400.0)
RBC: 4.52 Mil/uL (ref 3.87–5.11)
RDW: 13.4 % (ref 11.5–15.5)
WBC: 4.4 10*3/uL (ref 4.0–10.5)

## 2023-07-21 LAB — VITAMIN B12: Vitamin B-12: 488 pg/mL (ref 211–911)

## 2023-07-21 LAB — TSH: TSH: 1.45 u[IU]/mL (ref 0.35–5.50)

## 2023-07-23 ENCOUNTER — Other Ambulatory Visit: Payer: Self-pay

## 2023-07-24 ENCOUNTER — Other Ambulatory Visit: Payer: Self-pay

## 2023-07-24 ENCOUNTER — Other Ambulatory Visit: Payer: Self-pay | Admitting: Internal Medicine

## 2023-07-24 DIAGNOSIS — R634 Abnormal weight loss: Secondary | ICD-10-CM

## 2023-07-25 ENCOUNTER — Other Ambulatory Visit: Payer: Self-pay

## 2023-07-28 ENCOUNTER — Other Ambulatory Visit: Payer: Self-pay

## 2023-07-28 MED FILL — Mirtazapine Tab 15 MG: ORAL | 30 days supply | Qty: 30 | Fill #0 | Status: CN

## 2023-07-29 ENCOUNTER — Other Ambulatory Visit: Payer: Self-pay

## 2023-07-30 ENCOUNTER — Encounter: Payer: Self-pay | Admitting: Internal Medicine

## 2023-07-30 ENCOUNTER — Ambulatory Visit: Payer: Commercial Managed Care - PPO | Admitting: Internal Medicine

## 2023-07-30 ENCOUNTER — Other Ambulatory Visit: Payer: Self-pay

## 2023-07-30 VITALS — BP 100/74 | HR 88 | Ht 63.0 in | Wt 108.0 lb

## 2023-07-30 DIAGNOSIS — I700 Atherosclerosis of aorta: Secondary | ICD-10-CM

## 2023-07-30 DIAGNOSIS — F411 Generalized anxiety disorder: Secondary | ICD-10-CM

## 2023-07-30 DIAGNOSIS — R634 Abnormal weight loss: Secondary | ICD-10-CM

## 2023-07-30 DIAGNOSIS — F430 Acute stress reaction: Secondary | ICD-10-CM | POA: Diagnosis not present

## 2023-07-30 MED ORDER — MIRTAZAPINE 7.5 MG PO TABS
7.5000 mg | ORAL_TABLET | Freq: Every day | ORAL | 5 refills | Status: DC
  Filled 2023-07-30: qty 30, 30d supply, fill #0
  Filled 2023-09-01: qty 30, 30d supply, fill #1
  Filled 2023-10-01: qty 30, 30d supply, fill #2
  Filled 2023-10-31: qty 30, 30d supply, fill #3
  Filled 2024-06-01: qty 30, 30d supply, fill #4

## 2023-07-30 MED ORDER — EZETIMIBE 10 MG PO TABS
10.0000 mg | ORAL_TABLET | Freq: Every day | ORAL | 5 refills | Status: DC
  Filled 2023-07-30: qty 30, 30d supply, fill #0
  Filled 2024-06-01 – 2024-06-17 (×2): qty 30, 30d supply, fill #1

## 2023-07-30 MED ORDER — ONDANSETRON 4 MG PO TBDP
4.0000 mg | ORAL_TABLET | Freq: Three times a day (TID) | ORAL | 5 refills | Status: DC | PRN
  Filled 2023-07-30: qty 20, 7d supply, fill #0
  Filled 2023-12-15 (×2): qty 20, 7d supply, fill #1
  Filled 2024-02-13: qty 20, 7d supply, fill #2

## 2023-07-30 NOTE — Progress Notes (Unsigned)
Subjective:  Patient ID: Jean Brooks, female    DOB: 02/16/1946  Age: 77 y.o. MRN: [id number]  CC: {There were no encounter diagnoses. (Refresh or delete this SmartLink)}   HPI Jean Brooks presents for  Chief Complaint  Patient presents with   Medical Management of Chronic Issues    1 month follow up       Outpatient Medications Prior to Visit  Medication Sig Dispense Refill   acetaminophen (TYLENOL) 500 MG tablet Take 500 mg by mouth every 6 (six) hours as needed.     cyanocobalamin (VITAMIN B12) 1000 MCG/ML injection Inject 1 mL (1,000 mcg total) into the muscle every 30 (thirty) days. 10 mL 0   mirtazapine (REMERON) 15 MG tablet TAKE 1/2 TO 1 TABLET BY MOUTH DAILY 30 MINUTES BEFORE BEDTIME 30 tablet 5   ondansetron (ZOFRAN-ODT) 4 MG disintegrating tablet Take 1 tablet (4 mg total) by mouth every 8 (eight) hours as needed for nausea or vomiting. 20 tablet 5   sodium chloride (MURO 128) 5 % ophthalmic ointment Place 1 application  into the right eye at bedtime.     Syringe/Needle, Disp, (SYRINGE 3CC/25GX1") 25G X 1" 3 ML MISC Use to give b12 injection 2 each 0   zolpidem (AMBIEN CR) 12.5 MG CR tablet Take 1 tablet (12.5 mg total) by mouth at bedtime as needed. 30 tablet 5   Cholecalciferol (VITAMIN D3) 1000 UNITS CAPS Take 1 capsule by mouth daily at 6 (six) AM. Alternate with the magnesium for leg pain (Patient not taking: Reported on 07/03/2023)     fluticasone (FLONASE) 50 MCG/ACT nasal spray Place 2 sprays into both nostrils daily as needed for allergies or rhinitis. (Patient not taking: Reported on 07/03/2023) 16 g 6   Magnesium 400 MG CAPS Take 1 tablet by mouth as needed (leg cramps). (Patient not taking: Reported on 07/03/2023)     mupirocin ointment (BACTROBAN) 2 % Apply 1 Application topically 3 (three) times daily. (Patient not taking: Reported on 07/03/2023) 22 g 0   rosuvastatin (CRESTOR) 10 MG tablet Take 1 tablet (10 mg total) by mouth every other day. (Patient  not taking: Reported on 07/03/2023) 45 tablet 3   No facility-administered medications prior to visit.    Review of Systems;  Patient denies headache, fevers, malaise, unintentional weight loss, skin rash, eye pain, sinus congestion and sinus pain, sore throat, dysphagia,  hemoptysis , cough, dyspnea, wheezing, chest pain, palpitations, orthopnea, edema, abdominal pain, nausea, melena, diarrhea, constipation, flank pain, dysuria, hematuria, urinary  Frequency, nocturia, numbness, tingling, seizures,  Focal weakness, Loss of consciousness,  Tremor, insomnia, depression, anxiety, and suicidal ideation.      Objective:  BP 100/74   Pulse 88   Ht 5\' 3"  (1.6 m)   Wt 108 lb (49 kg)   SpO2 96%   BMI 19.13 kg/m   BP Readings from Last 3 Encounters:  07/30/23 100/74  07/03/23 108/60  02/20/23 118/62    Wt Readings from Last 3 Encounters:  07/30/23 108 lb (49 kg)  07/03/23 107 lb 12.8 oz (48.9 kg)  02/20/23 111 lb (50.3 kg)    Physical Exam  Lab Results  Component Value Date   HGBA1C 5.9 08/28/2022   HGBA1C 6.0 08/21/2021   HGBA1C 5.7 05/21/2016    Lab Results  Component Value Date   CREATININE 0.77 07/21/2023   CREATININE 0.80 02/20/2023   CREATININE 0.86 08/28/2022    Lab Results  Component Value Date   WBC 4.4 07/21/2023  HGB 13.2 07/21/2023   HCT 40.9 07/21/2023   PLT 243.0 07/21/2023   GLUCOSE 94 07/21/2023   CHOL 219 (H) 07/21/2023   TRIG 70.0 07/21/2023   HDL 68.10 07/21/2023   LDLDIRECT 141.0 02/20/2023   LDLCALC 137 (H) 07/21/2023   ALT 12 07/21/2023   AST 17 07/21/2023   NA 142 07/21/2023   K 4.0 07/21/2023   CL 105 07/21/2023   CREATININE 0.77 07/21/2023   BUN 16 07/21/2023   CO2 29 07/21/2023   TSH 1.45 07/21/2023   HGBA1C 5.9 08/28/2022    MM 3D SCREEN BREAST BILATERAL  Result Date: 09/10/2022 CLINICAL DATA:  Screening. EXAM: DIGITAL SCREENING BILATERAL MAMMOGRAM WITH TOMOSYNTHESIS AND CAD TECHNIQUE: Bilateral screening digital  craniocaudal and mediolateral oblique mammograms were obtained. Bilateral screening digital breast tomosynthesis was performed. The images were evaluated with computer-aided detection. COMPARISON:  Previous exam(s). ACR Breast Density Category b: There are scattered areas of fibroglandular density. FINDINGS: There are no findings suspicious for malignancy. IMPRESSION: No mammographic evidence of malignancy. A result letter of this screening mammogram will be mailed directly to the patient. RECOMMENDATION: Screening mammogram in one year. (Code:SM-B-01Y) BI-RADS CATEGORY  1: Negative. Electronically Signed   By: Meda Klinefelter M.D.   On: 09/10/2022 14:50    Assessment & Plan:  .There are no diagnoses linked to this encounter.   I provided 30 minutes of face-to-face time during this encounter reviewing patient's last visit with me, patient's  most recent visit with cardiology,  nephrology,  and neurology,  recent surgical and non surgical procedures, previous  labs and imaging studies, counseling on currently addressed issues,  and post visit ordering to diagnostics and therapeutics .   Follow-up: No follow-ups on file.   Sherlene Shams, MD

## 2023-07-30 NOTE — Patient Instructions (Signed)
New rx for mirtazipine 7.5 mg dose sent to [facility]   Zofran refilled  Your cholesterol is elevated without medication. Your LDL is 137  Goal  Is 70 to 100.   If you are willing to try Zetia, you may tolerate it better than the statins that you did not tolerate previously.  It works by inhibiting the absorption of cholesterol by the small intestine, so it lowers LDL . It is very well tolerated by the majority of statin intolerant patients who try it.   Rx has been sent to [facility]

## 2023-07-31 NOTE — Assessment & Plan Note (Signed)
Reviewed findings of prior CT scan today..  Patient was tolerating  20 mg crestor twice weekly  but since her husband's hospitlization she has stopped taking all of her medications and is unwilling to start it.  Trial of Zetia

## 2023-07-31 NOTE — Assessment & Plan Note (Signed)
Mirtazapine reduced to 7.5 mg daily

## 2023-07-31 NOTE — Assessment & Plan Note (Signed)
She has been unable to concentrate at work since her husband was hospitalized  and has reduced her work hours to 3 days per week

## 2023-08-20 ENCOUNTER — Other Ambulatory Visit: Payer: Self-pay

## 2023-08-20 ENCOUNTER — Other Ambulatory Visit: Payer: Self-pay | Admitting: Internal Medicine

## 2023-08-20 DIAGNOSIS — Z9049 Acquired absence of other specified parts of digestive tract: Secondary | ICD-10-CM

## 2023-08-20 MED ORDER — ZOLPIDEM TARTRATE ER 12.5 MG PO TBCR
12.5000 mg | EXTENDED_RELEASE_TABLET | Freq: Every evening | ORAL | 5 refills | Status: DC | PRN
  Filled 2023-08-20 – 2023-08-21 (×2): qty 30, 30d supply, fill #0
  Filled 2023-09-20: qty 30, 30d supply, fill #1
  Filled 2023-10-31: qty 30, 30d supply, fill #2
  Filled 2023-12-01: qty 30, 30d supply, fill #3
  Filled 2024-01-22: qty 30, 30d supply, fill #4

## 2023-08-21 ENCOUNTER — Other Ambulatory Visit: Payer: Self-pay

## 2023-08-22 ENCOUNTER — Telehealth: Payer: Self-pay | Admitting: Internal Medicine

## 2023-08-22 DIAGNOSIS — E782 Mixed hyperlipidemia: Secondary | ICD-10-CM

## 2023-08-22 NOTE — Telephone Encounter (Signed)
Patient need lab orders.

## 2023-09-01 ENCOUNTER — Other Ambulatory Visit (INDEPENDENT_AMBULATORY_CARE_PROVIDER_SITE_OTHER): Payer: Commercial Managed Care - PPO

## 2023-09-01 DIAGNOSIS — E782 Mixed hyperlipidemia: Secondary | ICD-10-CM | POA: Diagnosis not present

## 2023-09-01 LAB — COMPREHENSIVE METABOLIC PANEL
ALT: 14 U/L (ref 0–35)
AST: 19 U/L (ref 0–37)
Albumin: 4.3 g/dL (ref 3.5–5.2)
Alkaline Phosphatase: 52 U/L (ref 39–117)
BUN: 18 mg/dL (ref 6–23)
CO2: 28 meq/L (ref 19–32)
Calcium: 9.2 mg/dL (ref 8.4–10.5)
Chloride: 103 meq/L (ref 96–112)
Creatinine, Ser: 0.76 mg/dL (ref 0.40–1.20)
GFR: 76.23 mL/min (ref >60.00)
Glucose, Bld: 93 mg/dL (ref 70–99)
Potassium: 3.7 meq/L (ref 3.5–5.1)
Sodium: 138 meq/L (ref 135–145)
Total Bilirubin: 0.5 mg/dL (ref 0.2–1.2)
Total Protein: 6.9 g/dL (ref 6.0–8.3)

## 2023-09-04 ENCOUNTER — Encounter: Payer: Commercial Managed Care - PPO | Admitting: Internal Medicine

## 2023-09-11 ENCOUNTER — Ambulatory Visit
Admission: RE | Admit: 2023-09-11 | Discharge: 2023-09-11 | Disposition: A | Discharge status: Home / Self Care | Payer: Commercial Managed Care - PPO | Source: Ambulatory Visit | Attending: Internal Medicine | Admitting: Internal Medicine

## 2023-09-11 DIAGNOSIS — Z1231 Encounter for screening mammogram for malignant neoplasm of breast: Secondary | ICD-10-CM | POA: Diagnosis not present

## 2023-09-22 ENCOUNTER — Other Ambulatory Visit: Payer: Self-pay

## 2023-10-02 ENCOUNTER — Other Ambulatory Visit: Payer: Self-pay

## 2023-10-02 DIAGNOSIS — H6063 Unspecified chronic otitis externa, bilateral: Secondary | ICD-10-CM | POA: Diagnosis not present

## 2023-10-02 DIAGNOSIS — H6123 Impacted cerumen, bilateral: Secondary | ICD-10-CM | POA: Diagnosis not present

## 2023-10-03 ENCOUNTER — Encounter: Payer: Self-pay | Admitting: Internal Medicine

## 2023-10-03 ENCOUNTER — Ambulatory Visit (INDEPENDENT_AMBULATORY_CARE_PROVIDER_SITE_OTHER): Payer: Commercial Managed Care - PPO | Admitting: Internal Medicine

## 2023-10-03 ENCOUNTER — Other Ambulatory Visit: Payer: Self-pay

## 2023-10-03 VITALS — BP 138/74 | HR 70 | Ht 63.0 in | Wt 110.0 lb

## 2023-10-03 DIAGNOSIS — Z23 Encounter for immunization: Secondary | ICD-10-CM | POA: Diagnosis not present

## 2023-10-03 DIAGNOSIS — K5790 Diverticulosis of intestine, part unspecified, without perforation or abscess without bleeding: Secondary | ICD-10-CM | POA: Insufficient documentation

## 2023-10-03 DIAGNOSIS — G4700 Insomnia, unspecified: Secondary | ICD-10-CM

## 2023-10-03 DIAGNOSIS — M8580 Other specified disorders of bone density and structure, unspecified site: Secondary | ICD-10-CM

## 2023-10-03 DIAGNOSIS — F430 Acute stress reaction: Secondary | ICD-10-CM

## 2023-10-03 DIAGNOSIS — Z8719 Personal history of other diseases of the digestive system: Secondary | ICD-10-CM | POA: Diagnosis not present

## 2023-10-03 DIAGNOSIS — E538 Deficiency of other specified B group vitamins: Secondary | ICD-10-CM

## 2023-10-03 DIAGNOSIS — Z0000 Encounter for general adult medical examination without abnormal findings: Secondary | ICD-10-CM

## 2023-10-03 DIAGNOSIS — F411 Generalized anxiety disorder: Secondary | ICD-10-CM | POA: Diagnosis not present

## 2023-10-03 MED ORDER — SYRINGE 25G X 1" 3 ML MISC
0 refills | Status: DC
  Filled 2023-10-03: qty 2, fill #0
  Filled 2023-12-15: qty 2, 30d supply, fill #0

## 2023-10-03 MED ORDER — CYANOCOBALAMIN 1000 MCG/ML IJ SOLN
1000.0000 ug | Freq: Once | INTRAMUSCULAR | Status: AC
  Administered 2023-10-03: 1000 ug via INTRAMUSCULAR

## 2023-10-03 MED ORDER — CYANOCOBALAMIN 1000 MCG/ML IJ SOLN
1000.0000 ug | INTRAMUSCULAR | 0 refills | Status: DC
  Filled 2023-10-03: qty 10, 300d supply, fill #0
  Filled 2023-12-15: qty 3, 90d supply, fill #0

## 2023-10-03 NOTE — Assessment & Plan Note (Signed)
 Managed with monthly injections   Lab Results  Component Value Date   VITAMINB12 488 07/21/2023

## 2023-10-03 NOTE — Assessment & Plan Note (Signed)
 S/p partial sigmoid colectomy for history of diverticulitis with perforation ,  in Oct 2023.  No recurrence .  Moving bowels without laxatives except prn miralax

## 2023-10-03 NOTE — Patient Instructions (Addendum)
  Your  DEXA scan  been ordered.  You are encouraged (required) to call to schedule your own  appointment at [facility]  , and their phone number is [phone] if they tell you it's nt covered, please let me know   Tdap,  RSV,  and B12 given today  RTC 6 months

## 2023-10-03 NOTE — Progress Notes (Signed)
 Patient ID: Jean Brooks, female    DOB: Mar 01, 1946  Age: 79 y.o. MRN: [id number]  The patient is here for annual preventive examination and management of other chronic and acute problems.   The risk factors are reflected in the social history.   The roster of all physicians providing medical care to patient - is listed in the Snapshot section of the chart.   Activities of daily living:  The patient is 100% independent in all ADLs: dressing, toileting, feeding as well as independent mobility   Home safety : The patient has smoke detectors in the home. They wear seatbelts.  There are no unsecured firearms at home. There is no violence in the home.    There is no risks for hepatitis, STDs or HIV. There is no   history of blood transfusion. They have no travel history to infectious disease endemic areas of the world.   The patient has seen their dentist in the last six month. They have seen their eye doctor in the last year. The patinet  denies slight hearing difficulty with regard to whispered voices and some television programs.  They have deferred audiologic testing in the last year.  They do not  have excessive sun exposure. Discussed the need for sun protection: hats, long sleeves and use of sunscreen if there is significant sun exposure.    Diet: the importance of a healthy diet is discussed. They do have a healthy diet.   The benefits of regular aerobic exercise were discussed. The patient  exercises  3 to 5 days per week  for  60 minutes.    Depression screen: there are no signs or vegative symptoms of depression- irritability, change in appetite, anhedonia, sadness/tearfullness.   The following portions of the patient's history were reviewed and updated as appropriate: allergies, current medications, past family history, past medical history,  past surgical history, past social history  and problem list.   Visual acuity was not assessed per patient preference since the patient has  regular follow up with an  ophthalmologist. Hearing and body mass index were assessed and reviewed.    During the course of the visit the patient was educated and counseled about appropriate screening and preventive services including : fall prevention , diabetes screening, nutrition counseling, colorectal cancer screening, and recommended immunizations.    Chief Complaint:  1) Anxiety:  she continues to feel extraordinary stress related to her  husband's recent development of major health issues.  Currently her son and husband are  both sick with RSV.   She has been using intermittent FMLA  to assist him at home with ADL's and transport him to his medical visits>  Her FMLA has been approved through March 2025.   2) Insomnia:  she continues to require use of ambien  to induce sleep and has no history of side effects      Review of Symptoms  Patient denies headache, fevers, malaise, unintentional weight loss, skin rash, eye pain, sinus congestion and sinus pain, sore throat, dysphagia,  hemoptysis , cough, dyspnea, wheezing, chest pain, palpitations, orthopnea, edema, abdominal pain, nausea, melena, diarrhea, constipation, flank pain, dysuria, hematuria, urinary  Frequency, nocturia, numbness, tingling, seizures,  Focal weakness, Loss of consciousness,  Tremor, depression, and suicidal ideation.    Physical Exam:  BP 138/74   Pulse 70   Ht 5' 3 (1.6 m)   Wt 110 lb (49.9 kg)   SpO2 98%   BMI 19.49 kg/m    Physical Exam Vitals  reviewed.  Constitutional:      General: She is not in acute distress.    Appearance: Normal appearance. She is normal weight. She is not ill-appearing, toxic-appearing or diaphoretic.  HENT:     Head: Normocephalic.  Eyes:     General: No scleral icterus.       Right eye: No discharge.        Left eye: No discharge.     Conjunctiva/sclera: Conjunctivae normal.  Cardiovascular:     Rate and Rhythm: Normal rate and regular rhythm.     Heart sounds: Normal  heart sounds.  Pulmonary:     Effort: Pulmonary effort is normal. No respiratory distress.     Breath sounds: Normal breath sounds.  Musculoskeletal:        General: Normal range of motion.  Skin:    General: Skin is warm and dry.  Neurological:     General: No focal deficit present.     Mental Status: She is alert and oriented to person, place, and time. Mental status is at baseline.  Psychiatric:        Mood and Affect: Mood normal.        Behavior: Behavior normal.        Thought Content: Thought content normal.        Judgment: Judgment normal.    Assessment and Plan: B12 deficiency Assessment & Plan: Managed with monthly injections   Lab Results  Component Value Date   VITAMINB12 488 07/21/2023     Orders: -     Cyanocobalamin   Diverticulosis  History of diverticulitis of colon Assessment & Plan: S/p partial sigmoid colectomy for history of diverticulitis with perforation ,  in Oct 2023.  No recurrence .  Moving bowels without laxatives except prn miralax    Need for Tdap vaccination -     Tdap vaccine greater than or equal to 7yo IM  Anxiety as acute reaction to exceptional stress Assessment & Plan: She remains unable to concentrate at work since her husband was hospitalized  and has reduced her work hours to 3 days per week    Insomnia, persistent Assessment & Plan: Has been dependent on ambien  cr for years BUT IS ONLY AVERAGING 4-5 HOURS OF SLEEP before waking up  .  She did not tolerate previous trials of trazodone  due to excessive sedation . Advised to add trial of Relaxium or other  (natural supplement)  as complementary therapy to increase her sleep period from 4-5 hours to 6-8    Encounter for preventive health examination Assessment & Plan: age appropriate education and counseling updated, referrals for preventative services and immunizations addressed, dietary and smoking counseling addressed, most recent labs reviewed.  I have personally reviewed  and have noted:   1) the patient's medical and social history 2) The pt's use of alcohol, tobacco, and illicit drugs 3) The patient's current medications and supplements 4) Functional ability including ADL's, fall risk, home safety risk, hearing and visual impairment 5) Diet and physical activities 6) Evidence for depression or mood disorder 7) The patient's height, weight, and BMI have been recorded in the chart   I have made referrals, and provided counseling and education based on review of the above    Osteopenia, unspecified location Assessment & Plan: Reviewed Dec 2021 DEXA with patient.    Her ten year risk of hip fracture and 10 year risk of any fracture is well below the recommended treatment threshhold , so no medications are advised at this  time.  Continue  goal calcium  intake of 1200 mg daily through diet and supplements and 1000 Ius of Vitamin D3 daily unless otherwise recommended. Recommend that we repeat the DEXA   this year, .    Other orders -     Cyanocobalamin ; Inject 1 mL (1,000 mcg total) into the muscle every 30 (thirty) days.  Dispense: 10 mL; Refill: 0 -     Syringe; Use to give b12 injection  Dispense: 2 each; Refill: 0    Return in about 6 months (around 04/01/2024).  Verneita LITTIE Kettering, MD

## 2023-10-05 NOTE — Assessment & Plan Note (Signed)
 Has been dependent on ambien  cr for years BUT IS ONLY AVERAGING 4-5 HOURS OF SLEEP before waking up  .  She did not tolerate previous trials of trazodone  due to excessive sedation . Advised to add trial of Relaxium or other  (natural supplement)  as complementary therapy to increase her sleep period from 4-5 hours to 6-8

## 2023-10-05 NOTE — Assessment & Plan Note (Addendum)
 Reviewed Dec 2021 DEXA with patient.    Her ten year risk of hip fracture and 10 year risk of any fracture is well below the recommended treatment threshhold , so no medications are advised at this time.  Continue  goal calcium  intake of 1200 mg daily through diet and supplements and 1000 Ius of Vitamin D3 daily unless otherwise recommended. Recommend that we repeat the DEXA   this year, .

## 2023-10-05 NOTE — Assessment & Plan Note (Signed)

## 2023-10-05 NOTE — Assessment & Plan Note (Signed)
 She remains unable to concentrate at work since her husband was hospitalized  and has reduced her work hours to 3 days per week

## 2023-10-14 ENCOUNTER — Encounter: Payer: Commercial Managed Care - PPO | Admitting: Internal Medicine

## 2023-10-16 ENCOUNTER — Other Ambulatory Visit: Payer: Self-pay

## 2023-10-16 MED ORDER — ABRYSVO 120 MCG/0.5ML IM SOLR
0.5000 mL | Freq: Once | INTRAMUSCULAR | 0 refills | Status: AC
  Filled 2023-10-16: qty 0.5, 1d supply, fill #0

## 2023-11-03 ENCOUNTER — Other Ambulatory Visit: Payer: Self-pay

## 2023-12-02 ENCOUNTER — Other Ambulatory Visit: Payer: Self-pay

## 2023-12-15 ENCOUNTER — Other Ambulatory Visit: Payer: Self-pay

## 2023-12-23 ENCOUNTER — Other Ambulatory Visit: Payer: Self-pay

## 2024-01-05 ENCOUNTER — Ambulatory Visit: Admitting: Family Medicine

## 2024-01-05 ENCOUNTER — Ambulatory Visit (INDEPENDENT_AMBULATORY_CARE_PROVIDER_SITE_OTHER)

## 2024-01-05 ENCOUNTER — Ambulatory Visit
Admission: RE | Admit: 2024-01-05 | Discharge: 2024-01-05 | Disposition: A | Discharge status: Home / Self Care | Source: Ambulatory Visit | Attending: Family Medicine | Admitting: Family Medicine

## 2024-01-05 ENCOUNTER — Encounter: Payer: Self-pay | Admitting: Family Medicine

## 2024-01-05 ENCOUNTER — Ambulatory Visit: Payer: Self-pay

## 2024-01-05 VITALS — BP 108/58 | HR 75 | Resp 20 | Ht 63.0 in | Wt 110.0 lb

## 2024-01-05 DIAGNOSIS — W19XXXA Unspecified fall, initial encounter: Secondary | ICD-10-CM | POA: Diagnosis not present

## 2024-01-05 DIAGNOSIS — R079 Chest pain, unspecified: Secondary | ICD-10-CM

## 2024-01-05 DIAGNOSIS — S0990XA Unspecified injury of head, initial encounter: Secondary | ICD-10-CM

## 2024-01-05 DIAGNOSIS — S0993XA Unspecified injury of face, initial encounter: Secondary | ICD-10-CM | POA: Diagnosis not present

## 2024-01-05 DIAGNOSIS — I672 Cerebral atherosclerosis: Secondary | ICD-10-CM | POA: Diagnosis not present

## 2024-01-05 DIAGNOSIS — R0781 Pleurodynia: Secondary | ICD-10-CM | POA: Diagnosis not present

## 2024-01-05 DIAGNOSIS — R220 Localized swelling, mass and lump, head: Secondary | ICD-10-CM | POA: Diagnosis not present

## 2024-01-05 DIAGNOSIS — R519 Headache, unspecified: Secondary | ICD-10-CM | POA: Diagnosis not present

## 2024-01-05 NOTE — Telephone Encounter (Signed)
 Copied from CRM [id number]. Topic: Clinical - Red Word Triage >> Jan 05, 2024  9:37 AM Emylou G wrote: [facility] that prompted transfer to Nurse Triage: fall.. hurts to talk   Chief Complaint: fall Symptoms: fall d/t tripped over dishwasher and face planted the floor, L inside leg hit the dishwasher door. Face is bruised and painful, has busted lip and swelling in places as well, pain 5/10  Frequency: yesterday morning  Pertinent Negatives: NA Disposition: [] ED /[] Urgent Care (no appt availability in office) / [x] Appointment(In office/virtual)/ []  [facility]/ [] Home Care/ [] Refused Recommended Disposition /[] [facility]/ []  Follow-up with PCP Additional Notes: pt states that she tripped and fell over dishwasher door. Has taken Tylenol and applied ice for pain and swelling. Wanting to be seen and just make sure everything ok. Leg doesn't hurt. Scheduled appt today with Dr. Clent Ridges 140. Care advice given and pt verbalized understanding.   Reason for Disposition  [1] Caller has URGENT question AND [2] triager unable to answer question  Answer Assessment - Initial Assessment Questions 1. MECHANISM: "How did the fall happen?"     Fell d/t tripping over dishwasher  3. ONSET: "When did the fall happen?" (e.g., minutes, hours, or days ago)     Yesterday morning  4. LOCATION: "What part of the body hit the ground?" (e.g., back, buttocks, head, hips, knees, hands, head, stomach)     Face and and L inside leg  5. INJURY: "Did you hurt (injure) yourself when you fell?" If Yes, ask: "What did you injure? Tell me more about this?" (e.g., body area; type of injury; pain severity)"     Bruising over lip and busted lip 6. PAIN: "Is there any pain?" If Yes, ask: "How bad is the pain?" (e.g., Scale 1-10; or mild,  moderate, severe)   - NONE (0): No pain   - MILD (1-3): Doesn't interfere with normal activities    - MODERATE (4-7): Interferes with normal activities or awakens from sleep     - SEVERE (8-10): Excruciating pain, unable to do any normal activities      5/10 7. SIZE: For cuts, bruises, or swelling, ask: "How large is it?" (e.g., inches or centimeters)      R jaw and lip and under eye bone  10. CAUSE: "What do you think caused the fall (or falling)?" (e.g., tripped, dizzy spell)       Tripped and face planted on floor  Protocols used: [facility]

## 2024-01-05 NOTE — Patient Instructions (Addendum)
 It was a pleasure meeting you today. Thank you for allowing me to take part in your health care.  Our goals for today as we discussed include:  We will get some labs today.  If they are abnormal or we need to do something about them, I will call you.  If they are normal, I will send you a message on MyChart (if it is active) or a letter in the mail.  If you don't hear from [place] in 2 weeks, please call the office at the number below.   Chest xray today  Imaging ordered of head and face   This is a list of the screening recommended for you and due dates:  Health Maintenance  Topic Date Due   COVID-19 Vaccine (6 - 2024-25 season) 06/01/2023   Flu Shot  04/30/2024   Mammogram  09/10/2024   Colon Cancer Screening  05/25/2027   DTaP/Tdap/Td vaccine (3 - Td or Tdap) 10/02/2033   Pneumonia Vaccine  Completed   DEXA scan (bone density measurement)  Completed   Hepatitis C Screening  Completed   Zoster (Shingles) Vaccine  Completed   HPV Vaccine  Aged Out    If you have any questions or concerns, please do not hesitate to call the office at [phone].  I look forward to our next visit and until then take care and stay safe.  Regards,   Dana Allan, MD   [facility]

## 2024-01-05 NOTE — Progress Notes (Addendum)
 SUBJECTIVE:   Chief Complaint  Patient presents with   Rudolfo Cosier on face at home   HPI Presents for acute visit  Discussed the use of AI scribe software for clinical note transcription with the patient, who gave verbal consent to proceed.  History of Present Illness SAMYAH BILBO "Jean Brooks" is a 78 year old female who presents with facial trauma following a fall. She was assisted by her son after the fall.  She experienced facial trauma after a fall in her kitchen yesterday around lunchtime. While putting dishes in the dishwasher, she turned and hit her face on the open dishwasher door, causing her to fall onto the kitchen floor. She did not lose consciousness but was momentarily stunned and lay on the floor for about ten seconds before her son helped her up. She hit the right side of her face, resulting in tenderness and a split in her mouth that she believes might have needed stitches. She reports right chest tenderness and thinks this may be from recent fall.  She has difficulty eating and drinking due to the injury, as opening her mouth wide is painful and causes the wound to stretch. She has been using a straw to drink water . No dizziness, weakness, changes in vision, nausea, vomiting, or epistaxis were reported. There is no history of seizures, strokes, or use of blood thinners, and she does not take blood pressure medications. She has not experienced any similar falls before.  She reports a headache on the right side, which she managed with Tylenol , although she had to crush it to ingest it due to the mouth injury.  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    PERTINENT PMH / PSH: As above  OBJECTIVE:  BP (!) 108/58   Pulse 75   Resp 20   Ht 5\' 3"  (1.6 m)   Wt 110 lb (49.9 kg)   SpO2 98%   BMI 19.49 kg/m    Physical Exam Vitals reviewed.  Constitutional:      General: She is not  in acute distress.    Appearance: Normal appearance. She is normal weight. She is not ill-appearing, toxic-appearing or diaphoretic.  HENT:     Mouth/Throat:     Mouth: Mucous membranes are moist.  Eyes:     General:        Right eye: No discharge.        Left eye: No discharge.     Conjunctiva/sclera: Conjunctivae normal.  Cardiovascular:     Rate and Rhythm: Normal rate and regular rhythm.     Heart sounds: Normal heart sounds.  Pulmonary:     Effort: Pulmonary effort is normal.     Breath sounds: Normal breath sounds.  Chest:     Chest wall: Tenderness present.    Abdominal:     General: Bowel sounds are normal.  Musculoskeletal:        General: Normal range of motion.  Skin:    General: Skin is warm and dry.  Neurological:     General: No focal deficit present.     Mental Status: She is alert and oriented to person, place, and time. Mental status is at baseline.  Psychiatric:        Mood and Affect: Mood normal.        Behavior: Behavior normal.        Thought Content: Thought content normal.  Judgment: Judgment normal.                01/07/2024    3:19 PM 07/30/2023   11:41 AM 07/03/2023   12:26 PM 08/28/2022    8:14 AM 01/17/2022    3:15 PM  Depression screen PHQ 2/9  Decreased Interest 0 0 1 1 0  Down, Depressed, Hopeless 0 1 2 0 0  PHQ - 2 Score 0 1 3 1  0  Altered sleeping 1 2 2     Tired, decreased energy 0 1 1    Change in appetite 0 0 1    Feeling bad or failure about yourself  0 1 1    Trouble concentrating 0 2 1    Moving slowly or fidgety/restless 0 0 0    Suicidal thoughts 0 0 0    PHQ-9 Score 1 7 9     Difficult doing work/chores Somewhat difficult Not difficult at all Somewhat difficult        01/07/2024    3:19 PM 07/03/2023   12:26 PM  GAD 7 : Generalized Anxiety Score  Nervous, Anxious, on Edge 0 1  Control/stop worrying 0 2  Worry too much - different things 0 1  Trouble relaxing 0 1  Restless 0 1  Easily annoyed or  irritable 0 2  Afraid - awful might happen 0 1  Total GAD 7 Score 0 9  Anxiety Difficulty Not difficult at all Somewhat difficult    ASSESSMENT/PLAN:  Injury of head, initial encounter Assessment & Plan: Head injury due to mechanical fall.  Significant swelling and bruising on the right side of her face with a laceration in her mouth. No immediate neurological deficits observed. Concern for potential facial fractures or bleed. - Order CT scan of the head to assess for facial fractures and internal bleeding.  Orders: -     CT HEAD WO CONTRAST ( ); Future -     CT MAXILLOFACIAL WO CONTRAST; Future -     CBC with Differential/Platelet -     Comprehensive metabolic panel with GFR  Acute intractable headache, unspecified headache type Assessment & Plan: Right-sided headache likely related to facial trauma, improved with Tylenol . - Use Tylenol  as needed for pain management.   Fall, initial encounter Assessment & Plan: Mechanical fall with head trauma and chest tenderness No flail chest or difficulty breathing Tenderness over right anterior chest wall -Obtain Chest xray to r/o fracture   Orders: -     DG Ribs Unilateral Right; Future    PDMP reviewed  Return if symptoms worsen or fail to improve, for PCP.  Valli Gaw, MD

## 2024-01-05 NOTE — Assessment & Plan Note (Signed)
 Right-sided headache likely related to facial trauma, improved with Tylenol. - Use Tylenol as needed for pain management.

## 2024-01-05 NOTE — Assessment & Plan Note (Addendum)
 Head injury due to mechanical fall.  Significant swelling and bruising on the right side of her face with a laceration in her mouth. No immediate neurological deficits observed. Concern for potential facial fractures or bleed. - Order CT scan of the head to assess for facial fractures and internal bleeding.

## 2024-01-05 NOTE — Telephone Encounter (Signed)
 Noted.

## 2024-01-06 LAB — CBC WITH DIFFERENTIAL/PLATELET
Basophils Absolute: 0.1 10*3/uL (ref 0.0–0.1)
Basophils Relative: 1.2 % (ref 0.0–3.0)
Eosinophils Absolute: 0.3 10*3/uL (ref 0.0–0.7)
Eosinophils Relative: 4.6 % (ref 0.0–5.0)
HCT: 39.2 % (ref 36.0–46.0)
Hemoglobin: 13.1 g/dL (ref 12.0–15.0)
Lymphocytes Relative: 18.9 % (ref 12.0–46.0)
Lymphs Abs: 1.2 10*3/uL (ref 0.7–4.0)
MCHC: 33.4 g/dL (ref 30.0–36.0)
MCV: 89.9 fl (ref 78.0–100.0)
Monocytes Absolute: 0.5 10*3/uL (ref 0.1–1.0)
Monocytes Relative: 8.4 % (ref 3.0–12.0)
Neutro Abs: 4.3 10*3/uL (ref 1.4–7.7)
Neutrophils Relative %: 66.9 % (ref 43.0–77.0)
Platelets: 223 10*3/uL (ref 150.0–400.0)
RBC: 4.35 Mil/uL (ref 3.87–5.11)
RDW: 12.8 % (ref 11.5–15.5)
WBC: 6.4 10*3/uL (ref 4.0–10.5)

## 2024-01-06 LAB — COMPREHENSIVE METABOLIC PANEL WITH GFR
ALT: 15 U/L (ref 0–35)
AST: 19 U/L (ref 0–37)
Albumin: 4.4 g/dL (ref 3.5–5.2)
Alkaline Phosphatase: 58 U/L (ref 39–117)
BUN: 18 mg/dL (ref 6–23)
CO2: 25 meq/L (ref 19–32)
Calcium: 9.4 mg/dL (ref 8.4–10.5)
Chloride: 103 meq/L (ref 96–112)
Creatinine, Ser: 0.76 mg/dL (ref 0.40–1.20)
GFR: 76.05 mL/min (ref >60.00)
Glucose, Bld: 81 mg/dL (ref 70–99)
Potassium: 4.2 meq/L (ref 3.5–5.1)
Sodium: 139 meq/L (ref 135–145)
Total Bilirubin: 0.7 mg/dL (ref 0.2–1.2)
Total Protein: 7.2 g/dL (ref 6.0–8.3)

## 2024-01-07 ENCOUNTER — Encounter: Payer: Self-pay | Admitting: Pediatrics

## 2024-01-07 ENCOUNTER — Telehealth: Payer: Self-pay

## 2024-01-07 ENCOUNTER — Other Ambulatory Visit: Payer: Self-pay

## 2024-01-07 ENCOUNTER — Ambulatory Visit: Admitting: Pediatrics

## 2024-01-07 ENCOUNTER — Ambulatory Visit: Payer: Self-pay

## 2024-01-07 VITALS — BP 113/71 | HR 67 | Temp 97.4°F | Wt 108.6 lb

## 2024-01-07 DIAGNOSIS — N949 Unspecified condition associated with female genital organs and menstrual cycle: Secondary | ICD-10-CM

## 2024-01-07 DIAGNOSIS — R399 Unspecified symptoms and signs involving the genitourinary system: Secondary | ICD-10-CM

## 2024-01-07 LAB — URINALYSIS, ROUTINE W REFLEX MICROSCOPIC
Bilirubin, UA: NEGATIVE
Glucose, UA: NEGATIVE
Leukocytes,UA: NEGATIVE
Nitrite, UA: NEGATIVE
Protein,UA: NEGATIVE
RBC, UA: NEGATIVE
Specific Gravity, UA: 1.025 (ref 1.005–1.030)
Urobilinogen, Ur: 0.2 mg/dL (ref 0.2–1.0)
pH, UA: 6 (ref 5.0–7.5)

## 2024-01-07 LAB — MICROSCOPIC EXAMINATION

## 2024-01-07 MED ORDER — NITROFURANTOIN MONOHYD MACRO 100 MG PO CAPS
100.0000 mg | ORAL_CAPSULE | Freq: Two times a day (BID) | ORAL | 0 refills | Status: AC
Start: 2024-01-07 — End: 2024-01-14
  Filled 2024-01-07: qty 10, 5d supply, fill #0

## 2024-01-07 NOTE — Telephone Encounter (Signed)
 Spoke with pt and she stated that she did not have any additional questions.

## 2024-01-07 NOTE — Progress Notes (Signed)
 Office Visit  BP 113/71   Pulse 67   Temp (!) 97.4 F (36.3 C) (Oral)   Wt 108 lb 9.6 oz (49.3 kg)   SpO2 98%   BMI 19.24 kg/m    Subjective:    Patient ID: Jean Brooks, female    DOB: Apr 04, 1946, 78 y.o.   MRN: [id number]  HPI: Jean Brooks is a 78 y.o. female  Chief Complaint  Patient presents with  . Dysuria    Discussed the use of AI scribe software for clinical note transcription with the patient, who gave verbal consent to proceed.  History of Present Illness   Jean Brooks "Jean Brooks" is a 78 year old female with recurrent urinary tract infections who presents with symptoms of a UTI.  She experiences burning and pain during urination, consistent with her history of recurrent urinary tract infections, the last of which occurred over a year ago. She has used over-the-counter Azo for temporary symptom relief. A recent rapid urinalysis was negative, but a culture is pending.  She has multiple allergies that complicate treatment options, including a minimal reaction to Macrobid (nitrofurantoin) and a severe rash from Zithromycin. Omnicef was used for a UTI last year at an urgent care facility.  She has back pain attributed to a recent fall where she hit her face, resulting in significant bleeding. She did not seek medical attention for potential stitches. The swelling from the injury has decreased significantly.  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 She prefers to be at work rather than at home dwelling on her symptoms.      Relevant past medical, surgical, family and social history reviewed and updated as indicated. Interim medical history since our last visit reviewed. Allergies and medications reviewed and updated.  ROS per HPI unless specifically indicated above     Objective:    BP 113/71   Pulse 67   Temp (!) 97.4 F (36.3 C) (Oral)   Wt 108 lb 9.6 oz (49.3 kg)   SpO2 98%   BMI 19.24 kg/m   Wt Readings from Last 3  Encounters:  01/07/24 108 lb 9.6 oz (49.3 kg)  01/05/24 110 lb (49.9 kg)  10/03/23 110 lb (49.9 kg)     Physical Exam Constitutional:      Appearance: Normal appearance.  Pulmonary:     Effort: Pulmonary effort is normal.  Abdominal:     Tenderness: There is no abdominal tenderness. There is no right CVA tenderness or left CVA tenderness.  Musculoskeletal:        General: Normal range of motion.  Skin:    Comments: Normal skin color  Neurological:     General: No focal deficit present.     Mental Status: She is alert. Mental status is at baseline.  Psychiatric:        Mood and Affect: Mood normal.        Behavior: Behavior normal.        Thought Content: Thought content normal.        01/07/2024    3:19 PM 07/30/2023   11:41 AM 07/03/2023   12:26 PM 08/28/2022    8:14 AM 01/17/2022    3:15 PM  Depression screen PHQ 2/9  Decreased Interest 0 0 1 1 0  Down, Depressed, Hopeless 0 1 2 0 0  PHQ - 2 Score 0 1 3 1  0  Altered sleeping 1 2 2     Tired, decreased energy  0 1 1    Change in appetite 0 0 1    Feeling bad or failure about yourself  0 1 1    Trouble concentrating 0 2 1    Moving slowly or fidgety/restless 0 0 0    Suicidal thoughts 0 0 0    PHQ-9 Score 1 7 9     Difficult doing work/chores Somewhat difficult Not difficult at all Somewhat difficult         01/07/2024    3:19 PM 07/03/2023   12:26 PM  GAD 7 : Generalized Anxiety Score  Nervous, Anxious, on Edge 0 1  Control/stop worrying 0 2  Worry too much - different things 0 1  Trouble relaxing 0 1  Restless 0 1  Easily annoyed or irritable 0 2  Afraid - awful might happen 0 1  Total GAD 7 Score 0 9  Anxiety Difficulty Not difficult at all Somewhat difficult       Assessment & Plan:  Assessment & Plan   Urinary tract infection symptoms Vaginal discomfort H/o UTIs with multiple drug allergies complicate treatment. Despite negative rapid, has symptoms c/f UTI. Empirical nitrofurantoin initiated despite  negative rapid test to prevent worsening. Previous minimal reaction to nitrofurantoin noted. Educated on allergic reactions and follow-up instructions provided. - Prescribe nitrofurantoin 100 mg twice daily for 5 days. - Advise monitoring for allergic reactions and discontinue if she experiences them. - Instruct to contact office or use MyChart for concerns or persistent symptoms. - Discuss potential future use of topical estrogen to prevent UTIs and alleviate vaginal dryness. -     Urinalysis, Routine w reflex microscopic -     Urine Culture -     Nitrofurantoin Monohyd Macro; Take 1 capsule (100 mg total) by mouth 2 (two) times daily for 5 days.  Dispense: 10 capsule; Refill: 0 -     WET PREP FOR TRICH, YEAST, CLUE -     Microscopic Examination -     Urine Culture   Follow up plan: Return if symptoms worsen or fail to improve.  Hadassah Letters, MD

## 2024-01-07 NOTE — Telephone Encounter (Signed)
-----   Message from Dana Allan sent at 01/06/2024  5:07 PM EDT ----- Normal blood work

## 2024-01-07 NOTE — Patient Instructions (Addendum)
 Start macrobid 100mg  twice daily for 5 days  Please call if needed: [phone]

## 2024-01-07 NOTE — Telephone Encounter (Signed)
 Copied from CRM [id number]. Topic: Clinical - Red Word Triage >> Jan 07, 2024  8:54 AM Sim Boast F wrote: Red Word that prompted transfer to Nurse Triage: Patient having burning urination, says it hurts and she believes she has a UTI   Chief Complaint: pain with urination Symptoms: 4-5/10 pain with urination, urinary frequency and urgency, bad odor to urine "like yeast infection," maybe some flank pain but recent fall Frequency: continual, intermittent Pertinent Negatives: Patient denies fever, blood in urine, changes to vaginal discharge, lacerations/sores to genitals Disposition: [] 911 / [] ED /[] Urgent Care (no appt availability in office) / [x] Appointment(In office/virtual)/ []  [place] [facility]/ [] Home Care/ [] Refused Recommended Disposition /[] [place] [facility]/ []  Follow-up with PCP Additional Notes: Pt reporting that she has been having pain with urination that she noticed this morning, also experiencing urinary frequency and urgency, as well as "bad odor" to her urine. Pt reporting that she was recently examined by Dr. Clent Ridges for a fall the other day, hurt her face, been difficult to drink water, pt thinking UTI, hx UTI. Pt confirms that she has had a full stream of urine in the last 4 hours, no fever. Advised pt be examined today, scheduled with other office for today after work, confirmed appt/location info. Pt requesting if she can bring fresh sample with sterile urine container from her workplace since she goes to bathroom before heading out. Advised she can do so and the office will accept or deny. Pt requesting that if she should wait to give sample that she receive a call from [facility] alerting her to this, can call pt at [phone] and ask for [name]. Advised pt call back if worsening symptoms or other questions. Pt verbalized understanding. No further questions or concerns.  Reason for Disposition  Age > 50 years  Answer Assessment - Initial Assessment  Questions 1. SEVERITY: "How bad is the pain?"  (e.g., Scale 1-10; mild, moderate, or severe)   - MILD (1-3): complains slightly about urination hurting   - MODERATE (4-7): interferes with normal activities     - SEVERE (8-10): excruciating, unwilling or unable to urinate because of the pain      4 or 5/10 2. FREQUENCY: "How many times have you had painful urination today?"      3x, not very much coming out at a time 3. PATTERN: "Is pain present every time you urinate or just sometimes?"      Most of the time 4. ONSET: "When did the painful urination start?"      This morning 5. FEVER: "Do you have a fever?" If Yes, ask: "What is your temperature, how was it measured, and when did it start?"     no 6. PAST UTI: "Have you had a urine infection before?" If Yes, ask: "When was the last time?" and "What happened that time?"      Yes, a year or so 7. CAUSE: "What do you think is causing the painful urination?"  (e.g., UTI, scratch, Herpes sore)     UTI 8. OTHER SYMPTOMS: "Do you have any other symptoms?" (e.g., blood in urine, flank pain, genital sores, urgency, vaginal discharge)     Bad odor like yeast infection, just smells bad, no blood in urine, maybe some flank pain, sometimes rushing to bathroom  Protocols used: Urination Pain - Female-A-AH

## 2024-01-07 NOTE — Telephone Encounter (Signed)
 Pt would like it to be known that Dr. Clent Ridges "was so nice" and she has never seen her before but Dr. Clent Ridges "acted like she just knew me, didn't feel like a cold start," and she greatly appreciated this!

## 2024-01-07 NOTE — Telephone Encounter (Signed)
 Left message to call the office regarding the lab result of normal blood work. Okay for E2C2 to give the result of normal blood work.

## 2024-01-09 LAB — WET PREP FOR TRICH, YEAST, CLUE
Clue Cell Exam: NEGATIVE
Trichomonas Exam: NEGATIVE
Yeast Exam: NEGATIVE

## 2024-01-09 LAB — URINE CULTURE

## 2024-01-13 ENCOUNTER — Encounter: Payer: Self-pay | Admitting: Pediatrics

## 2024-01-16 ENCOUNTER — Other Ambulatory Visit: Payer: Self-pay

## 2024-01-16 ENCOUNTER — Other Ambulatory Visit: Payer: Self-pay | Admitting: Pediatrics

## 2024-01-16 DIAGNOSIS — B3731 Acute candidiasis of vulva and vagina: Secondary | ICD-10-CM

## 2024-01-16 MED ORDER — FLUCONAZOLE 150 MG PO TABS
150.0000 mg | ORAL_TABLET | Freq: Once | ORAL | 0 refills | Status: AC
  Filled 2024-01-16: qty 1, 1d supply, fill #0

## 2024-01-16 NOTE — Progress Notes (Signed)
 Recent tx UTI, s/p abx. Yeast infection symptoms, pt requested diflucan .  Hadassah Letters, MD

## 2024-01-22 ENCOUNTER — Other Ambulatory Visit: Payer: Self-pay

## 2024-01-22 DIAGNOSIS — W19XXXA Unspecified fall, initial encounter: Secondary | ICD-10-CM | POA: Insufficient documentation

## 2024-01-22 NOTE — Assessment & Plan Note (Signed)
 Mechanical fall with head trauma and chest tenderness No flail chest or difficulty breathing Tenderness over right anterior chest wall -Obtain Chest xray to r/o fracture

## 2024-02-02 ENCOUNTER — Ambulatory Visit: Payer: Self-pay

## 2024-02-02 NOTE — Telephone Encounter (Signed)
 Chief Complaint: Urinary symptoms Symptoms: Odor, itching, increased frequency, discomfort Frequency: since Saturday Pertinent Negatives: Patient denies fever Disposition: [] ED /[x] Urgent Care (no appt availability in office) / [] Appointment(In office/virtual)/ []  [place] [facility]/ [] Home Care/ [] Refused Recommended Disposition /[] [place] [facility]/ []  Follow-up with PCP Additional Notes: Patient called in stating she has had a return of her UTI symptoms. Patient states she has vaginal odor, itching, discomfort with urination, increased urge to urinate, and back pain. Patient previously completed all antibiotics but symptoms returned Saturday. Patient denies fever. Patient unable to come in tomorrow due to scheduling conflicts. Patient advised to go to UC for evaluation today due to back pain. Patient will be going to Urgent Care on [address] today.   Reason for Disposition  Side (flank) or lower back pain present  Answer Assessment - Initial Assessment Questions 1. SYMPTOM: "What's the main symptom you're concerned about?" (e.g., frequency, incontinence)     Vaginal itching and odor, increased frequency 2. ONSET: "When did the  urinary symptoms  start?"     Saturday 3. PAIN: "Is there any pain?" If Yes, ask: "How bad is it?" (Scale: 1-10; mild, moderate, severe)     Discomfort 4. CAUSE: "What do you think is causing the symptoms?"     UTI 5. OTHER SYMPTOMS: "Do you have any other symptoms?" (e.g., blood in urine, fever, flank pain, pain with urination)     Back pain, cloudy urine  Protocols used: Urinary Symptoms-A-AH

## 2024-02-02 NOTE — Telephone Encounter (Signed)
 FYI ... Pt is going to UC

## 2024-02-12 ENCOUNTER — Other Ambulatory Visit: Payer: Self-pay

## 2024-02-12 ENCOUNTER — Telehealth: Payer: Self-pay

## 2024-02-12 ENCOUNTER — Ambulatory Visit: Admitting: Nurse Practitioner

## 2024-02-12 ENCOUNTER — Encounter: Payer: Self-pay | Admitting: Nurse Practitioner

## 2024-02-12 VITALS — BP 108/60 | HR 87 | Temp 97.5°F | Ht 63.0 in | Wt 108.6 lb

## 2024-02-12 DIAGNOSIS — R509 Fever, unspecified: Secondary | ICD-10-CM

## 2024-02-12 DIAGNOSIS — R300 Dysuria: Secondary | ICD-10-CM | POA: Diagnosis not present

## 2024-02-12 DIAGNOSIS — N390 Urinary tract infection, site not specified: Secondary | ICD-10-CM | POA: Diagnosis not present

## 2024-02-12 DIAGNOSIS — R309 Painful micturition, unspecified: Secondary | ICD-10-CM

## 2024-02-12 DIAGNOSIS — R319 Hematuria, unspecified: Secondary | ICD-10-CM | POA: Diagnosis not present

## 2024-02-12 DIAGNOSIS — R8290 Unspecified abnormal findings in urine: Secondary | ICD-10-CM

## 2024-02-12 LAB — URINALYSIS, ROUTINE W REFLEX MICROSCOPIC
Bilirubin Urine: NEGATIVE
Ketones, ur: NEGATIVE
Nitrite: POSITIVE — AB
Specific Gravity, Urine: 1.015 (ref 1.000–1.030)
Total Protein, Urine: NEGATIVE
Urine Glucose: NEGATIVE
Urobilinogen, UA: 1 (ref 0.0–1.0)
pH: 6 (ref 5.0–8.0)

## 2024-02-12 LAB — POC URINALSYSI DIPSTICK (AUTOMATED)
Bilirubin, UA: NEGATIVE
Glucose, UA: NEGATIVE
Ketones, UA: NEGATIVE
Nitrite, UA: POSITIVE
Protein, UA: POSITIVE — AB
Spec Grav, UA: 1.02 (ref 1.010–1.025)
Urobilinogen, UA: 1 U/dL
pH, UA: 6.5 (ref 5.0–8.0)

## 2024-02-12 MED ORDER — NITROFURANTOIN MONOHYD MACRO 100 MG PO CAPS
100.0000 mg | ORAL_CAPSULE | Freq: Two times a day (BID) | ORAL | 0 refills | Status: DC
  Filled 2024-02-12: qty 14, 7d supply, fill #0

## 2024-02-12 NOTE — Telephone Encounter (Signed)
 Medication list updated.

## 2024-02-12 NOTE — Progress Notes (Signed)
 Established Patient Office Visit  Subjective:  Patient ID: Jean Brooks, female    DOB: 07/13/1946  Age: 78 y.o. MRN: [id number]  CC:  Chief Complaint  Patient presents with   Urinary Tract Infection  Discussed the use of a AI scribe software for clinical note transcription with the patient, who gave verbal consent to proceed.   HPI  Jean Brooks "Jean Brooks" is a 78 year old female who presents with recurrent urinary tract infection symptoms. She was treated with Nitrofurantoin  on 01/07/24 for five days.   Symptoms began shortly after completing a course of antibiotic. Two days post-treatment, she experienced dysuria, urinary frequency, chills, low-grade fever around 51F,  some nausea and back pain. Her urine is dark with odor and without gross hematuria.  She has been using Tylenol  and AZO for symptom relief, with mild improvement. AZO was last taken on Monday night.  She has multiple antibiotic allergies, including penicillin,  cipro  and sulfa.  HPI   Past Medical History:  Diagnosis Date   Abdominal aortic atherosclerosis (HCC)    Arthritis    bil hands/thumbs   Bradycardia    Chest pain, non-cardiac 2010   a. stress echo 2010: nl treadmill EKG w/o evidence of ischemia or arrhythmia, good exercise tolerance for age, normal stress echo images w/o evidence of myocardial ischemia; b. baseline echo with EF 55% peak stress showed nl LV systolic function wtih EF 65% w/o evidence of HK   Chronic colitis    Chronic venous insufficiency    Complication of anesthesia    Coronary artery disease    Cyst, ovarian 12/29/2011   Serous cystadenomas, bilateral.  S/pBSO May 2019 ([facility])    Diverticulitis 02/19/2022   Diverticulitis of colon with hemorrhage 04/2008   Hospitalized [facility]   Family history of adverse reaction to anesthesia    mom and maternal grandmother-agitated   GERD (gastroesophageal reflux disease)    h/o   H/O syncope    a. echo 2010: EF 60%, LV nl size, RV nl  size & fxn, atria nl size, aorta nl, no pericardial effusion, aortic valve nl, mitral valve nl w/ mild insufficiency, tricuspid valve nl w/ mild insufficiency, pulmonic valve nl   Heart murmur    asymptomatic-in pt's 20's   Lymphedema    Migraine headache    h/o migraines   Normocytic anemia    PONV (postoperative nausea and vomiting)    Scoliosis of thoracic spine    Shingles    SVT (supraventricular tachycardia) (HCC)    Syncope and collapse     Past Surgical History:  Procedure Laterality Date   ABDOMINAL HYSTERECTOMY  1991   APPENDECTOMY  1963   COLONOSCOPY     COLONOSCOPY WITH PROPOFOL  N/A 05/24/2022   Procedure: COLONOSCOPY WITH PROPOFOL ;  Surgeon: Luke Salaam, MD;  Location: [facility] ENDOSCOPY;  Service: Gastroenterology;  Laterality: N/A;   ESOPHAGOGASTRODUODENOSCOPY     LAPAROSCOPIC BILATERAL SALPINGO OOPHERECTOMY Bilateral 02/20/2018   Procedure: LAPAROSCOPIC BILATERAL SALPINGO OOPHORECTOMY;  Surgeon: Ward, Margarie Shay, MD;  Location: [facility] ORS;  Service: Gynecology;  Laterality: Bilateral;   LAPAROSCOPIC LYSIS OF ADHESIONS  02/20/2018   Procedure: LAPAROSCOPIC LYSIS OF ADHESIONS;  Surgeon: Ward, Margarie Shay, MD;  Location: [facility] ORS;  Service: Gynecology;;   TONSILLECTOMY     age 64 or 6    Family History  Problem Relation Age of Onset   COPD Mother 7       respiratory failure/fibrosis, seizures   Stroke Mother    Hypertension  Mother    Supraventricular tachycardia Mother    COPD Father 54   Mental illness Father        Parkinson's Dementia   Aneurysm Father 62   Stroke Son 4       hypertensive, left brain    Heart attack Maternal Grandfather 32       passed   Stroke Maternal Grandmother    Breast cancer Maternal Aunt     Social History   Socioeconomic History   Marital status: Married    Spouse name: Not on file   Number of children: Not on file   Years of education: Not on file   Highest education level: Not on file  Occupational History   Not on file   Tobacco Use   Smoking status: Never   Smokeless tobacco: Never  Vaping Use   Vaping status: Never Used  Substance and Sexual Activity   Alcohol use: No   Drug use: No   Sexual activity: Not on file  Other Topics Concern   Not on file  Social History Narrative   Not on file   Social Drivers of Health   Financial Resource Strain: Not on file  Food Insecurity: No Food Insecurity (06/18/2022)   Hunger Vital Sign    Worried About Running Out of Food in the Last Year: Never true    Ran Out of Food in the Last Year: Never true  Transportation Needs: No Transportation Needs (06/18/2022)   PRAPARE - Administrator, Civil Service (Medical): No    Lack of Transportation (Non-Medical): No  Physical Activity: Not on file  Stress: Not on file  Social Connections: Not on file  Intimate Partner Violence: Not At Risk (06/18/2022)   Humiliation, Afraid, Rape, and Kick questionnaire    Fear of Current or Ex-Partner: No    Emotionally Abused: No    Physically Abused: No    Sexually Abused: No     Outpatient Medications Prior to Visit  Medication Sig Dispense Refill   acetaminophen  (TYLENOL ) 500 MG tablet Take 500 mg by mouth every 6 (six) hours as needed.     mirtazapine  (REMERON ) 7.5 MG tablet Take 1 tablet (7.5 mg total) by mouth at bedtime. 30 tablet 5   sodium chloride  (MURO 128) 5 % ophthalmic ointment Place 1 application  into the right eye at bedtime.     Syringe/Needle, Disp, (SYRINGE 3CC/25GX1") 25G X 1" 3 ML MISC Use to give b12 injection 2 each 0   zolpidem  (AMBIEN  CR) 12.5 MG CR tablet Take 1 tablet (12.5 mg total) by mouth at bedtime as needed. 30 tablet 5   cyanocobalamin  (VITAMIN B12) 1000 MCG/ML injection Inject 1 mL (1,000 mcg total) into the muscle every 30 (thirty) days. (Patient not taking: Reported on 02/12/2024) 10 mL 0   ezetimibe  (ZETIA ) 10 MG tablet Take 1 tablet (10 mg total) by mouth daily. (Patient not taking: Reported on 02/12/2024) 30 tablet 5    ondansetron  (ZOFRAN -ODT) 4 MG disintegrating tablet Take 1 tablet (4 mg total) by mouth every 8 (eight) hours as needed for nausea or vomiting. (Patient not taking: Reported on 02/12/2024) 20 tablet 5   No facility-administered medications prior to visit.    Allergies  Allergen Reactions   Omnicef  [Cefdinir ] Shortness Of Breath and Palpitations   Sulfa Antibiotics Hives   Atorvastatin  Nausea Only   Flagyl  [Metronidazole ] Other (See Comments)    Can't remember but remembers it was not a good experience  Aspirin Rash   Azithromycin Rash    Within the hour   Ciprofloxacin  Nausea Only and Rash    After 3 day   Erythromycin Rash    Confirmed Feb 2019   Ibuprofen Rash   Nitrofurantoin  Rash   Penicillins Rash    Has patient had a PCN reaction causing immediate rash, facial/tongue/throat swelling, SOB or lightheadedness with hypotension: Yes Has patient had a PCN reaction causing severe rash involving mucus membranes or skin necrosis: No Has patient had a PCN reaction that required hospitalization: No Has patient had a PCN reaction occurring within the last 10 years: No If all of the above answers are "NO", then may proceed with Cephalosporin use.     ROS Review of Systems  Gastrointestinal:  Positive for nausea.  Genitourinary:  Positive for dysuria, flank pain, frequency and urgency.   Negative unless indicated in HPI.    Objective:     Physical Exam Constitutional:      Appearance: Normal appearance.  Cardiovascular:     Rate and Rhythm: Normal rate and regular rhythm.     Pulses: Normal pulses.     Heart sounds: Normal heart sounds.  Abdominal:     General: Bowel sounds are normal.     Palpations: Abdomen is soft.     Tenderness: There is no abdominal tenderness. There is left CVA tenderness. There is no right CVA tenderness.  Musculoskeletal:     Cervical back: Normal range of motion.  Neurological:     General: No focal deficit present.     Mental Status: She is  alert. Mental status is at baseline.  Psychiatric:        Mood and Affect: Mood normal.        Behavior: Behavior normal.        Thought Content: Thought content normal.        Judgment: Judgment normal.     BP 108/60   Pulse 87   Temp (!) 97.5 F (36.4 C)   Ht 5\' 3"  (1.6 m)   Wt 108 lb 9.6 oz (49.3 kg)   SpO2 97%   BMI 19.24 kg/m  Wt Readings from Last 3 Encounters:  02/12/24 108 lb 9.6 oz (49.3 kg)  01/07/24 108 lb 9.6 oz (49.3 kg)  01/05/24 110 lb (49.9 kg)     Health Maintenance  Topic Date Due   COVID-19 Vaccine (8 - 2024-25 season) 02/27/2024 (Originally 06/01/2023)   INFLUENZA VACCINE  04/30/2024   MAMMOGRAM  09/10/2024   Colonoscopy  05/25/2027   DTaP/Tdap/Td (3 - Td or Tdap) 10/02/2033   Pneumonia Vaccine 66+ Years old  Completed   DEXA SCAN  Completed   Hepatitis C Screening  Completed   Zoster Vaccines- Shingrix  Completed   HPV VACCINES  Aged Out   Meningococcal B Vaccine  Aged Out    There are no preventive care reminders to display for this patient.  Lab Results  Component Value Date   TSH 1.45 07/21/2023   Lab Results  Component Value Date   WBC 6.4 01/05/2024   HGB 13.1 01/05/2024   HCT 39.2 01/05/2024   MCV 89.9 01/05/2024   PLT 223.0 01/05/2024   Lab Results  Component Value Date   NA 139 01/05/2024   K 4.2 01/05/2024   CO2 25 01/05/2024   GLUCOSE 81 01/05/2024   BUN 18 01/05/2024   CREATININE 0.76 01/05/2024   BILITOT 0.7 01/05/2024   ALKPHOS 58 01/05/2024   AST 19 01/05/2024  ALT 15 01/05/2024   PROT 7.2 01/05/2024   ALBUMIN 4.4 01/05/2024   CALCIUM  9.4 01/05/2024   ANIONGAP 7 06/19/2022   GFR 76.05 01/05/2024   Lab Results  Component Value Date   CHOL 219 (H) 07/21/2023   Lab Results  Component Value Date   HDL 68.10 07/21/2023   Lab Results  Component Value Date   LDLCALC 137 (H) 07/21/2023   Lab Results  Component Value Date   TRIG 70.0 07/21/2023   Lab Results  Component Value Date   CHOLHDL 3 07/21/2023    Lab Results  Component Value Date   HGBA1C 5.9 08/28/2022      Assessment & Plan:  Urinary tract infection with hematuria, site unspecified Assessment & Plan: Recurrent UTI with dysuria, urgency, frequency, low-grade fever, left flank pain and chills.  Dipstick positive for moderate blood, nitrite and leukocytes. Multiple antibiotic allergies, including penicillin, cipro , omnicef  and sulfa. - Given flank pain and low grade fever will check CBC.  -Will treat with Nitrofurantoin  twice a day for 7 days.  -Urine culture pending.   -Increase fluid intake. -Advised urgent care or ED visit if symptoms worsen over weekend.   Abnormal urine odor -     POCT Urinalysis Dipstick (Automated)  Dysuria -     POCT Urinalysis Dipstick (Automated) -     Urinalysis, Routine w reflex microscopic -     Urine Culture  Fever, unspecified fever cause -     CBC with Differential/Platelet  Other orders -     Nitrofurantoin  Monohyd Macro; Take 1 capsule (100 mg total) by mouth 2 (two) times daily.  Dispense: 14 capsule; Refill: 0    Follow-up: Return if symptoms worsen or fail to improve.   Aprel Egelhoff, NP

## 2024-02-12 NOTE — Telephone Encounter (Signed)
 Copied from CRM [id number]. Topic: General - Other >> Feb 12, 2024 11:31 AM Baldo Levan wrote: Reason for CRM: Patient was in for a visit today with Tona Francis, and noticed on her after visit summary that it stated she is no longer taking the Vitamin B12 injection. Patient did want to let her know that she is still taking this and was unsure if something was communicated incorrectly.

## 2024-02-12 NOTE — Assessment & Plan Note (Addendum)
 Recurrent UTI with dysuria, urgency, frequency, low-grade fever, left flank pain and chills.  Dipstick positive for moderate blood, nitrite and leukocytes. Multiple antibiotic allergies, including penicillin, cipro , omnicef  and sulfa. - Given flank pain and low grade fever will check CBC.  -Will treat with Nitrofurantoin  twice a day for 7 days.  -Urine culture pending.   -Increase fluid intake. -Advised urgent care or ED visit if symptoms worsen over weekend.

## 2024-02-12 NOTE — Patient Instructions (Signed)
 You have a recurrent UTI with symptoms including pain during urination, frequent urination, low-grade fever, and chills. -Please go to [facility] mall for [facility] work. -You have been prescribed nitrofurantoin  for 7 days. -We will adjust your treatment based on the results of your urine culture. -Increase your fluid intake. -You can take acetaminophen  for pain relief. -Contact the clinic if you do not see improvement after completing the antibiotics. -Visit urgent care or the emergency department if your symptoms worsen over the weekend.

## 2024-02-13 ENCOUNTER — Other Ambulatory Visit: Payer: Self-pay

## 2024-02-13 ENCOUNTER — Other Ambulatory Visit
Admission: RE | Admit: 2024-02-13 | Discharge: 2024-02-13 | Disposition: A | Discharge status: Home / Self Care | Source: Ambulatory Visit | Attending: Nurse Practitioner | Admitting: Nurse Practitioner

## 2024-02-13 DIAGNOSIS — R509 Fever, unspecified: Secondary | ICD-10-CM | POA: Diagnosis not present

## 2024-02-13 LAB — CBC WITH DIFFERENTIAL/PLATELET
Abs Immature Granulocytes: 0.02 10*3/uL (ref 0.00–0.07)
Basophils Absolute: 0.1 10*3/uL (ref 0.0–0.1)
Basophils Relative: 1 %
Eosinophils Absolute: 0.1 10*3/uL (ref 0.0–0.5)
Eosinophils Relative: 2 %
HCT: 36.7 % (ref 36.0–46.0)
Hemoglobin: 11.7 g/dL — ABNORMAL LOW (ref 12.0–15.0)
Immature Granulocytes: 0 %
Lymphocytes Relative: 19 %
Lymphs Abs: 1.1 10*3/uL (ref 0.7–4.0)
MCH: 29.7 pg (ref 26.0–34.0)
MCHC: 31.9 g/dL (ref 30.0–36.0)
MCV: 93.1 fL (ref 80.0–100.0)
Monocytes Absolute: 0.7 10*3/uL (ref 0.1–1.0)
Monocytes Relative: 12 %
Neutro Abs: 3.9 10*3/uL (ref 1.7–7.7)
Neutrophils Relative %: 66 %
Platelets: 205 10*3/uL (ref 150–400)
RBC: 3.94 MIL/uL (ref 3.87–5.11)
RDW: 12.6 % (ref 11.5–15.5)
WBC: 5.9 10*3/uL (ref 4.0–10.5)
nRBC: 0 % (ref 0.0–0.2)

## 2024-02-14 LAB — URINE CULTURE
MICRO NUMBER:: 16460498
SPECIMEN QUALITY:: ADEQUATE

## 2024-02-15 ENCOUNTER — Ambulatory Visit: Payer: Self-pay | Admitting: Nurse Practitioner

## 2024-02-15 DIAGNOSIS — R7989 Other specified abnormal findings of blood chemistry: Secondary | ICD-10-CM

## 2024-02-15 NOTE — Progress Notes (Signed)
 Please call patient: Hemoglobin has dropped from 13.7 to 11.7 in a month.  Have she experienced any kind of blood loss in stool or urine.  Any black or tarry stool,visible blood or any other symptoms.  We will repeat the labs please schedule an appointment.

## 2024-02-16 NOTE — Telephone Encounter (Signed)
 Copied from CRM [id number]. Topic: Clinical - Lab/Test Results >> Feb 16, 2024 10:45 AM Jenice Mitts wrote: Reason for CRM: Patient is calling back to go over lab results  Missed call from [facility]

## 2024-02-16 NOTE — Telephone Encounter (Signed)
 Left message to call the office back regarding the lab results below. Okay for E2C2 to give the results and ask the questions. Send a message back to the clinical pool and schedule the repeat labs.

## 2024-02-16 NOTE — Telephone Encounter (Signed)
-----   Message from Tona Francis sent at 02/15/2024 12:02 PM EDT ----- Please call patient: Hemoglobin has dropped from 13.7 to 11.7 in a month.  Have she experienced any kind of blood loss in stool or urine.  Any black or tarry stool,visible blood or any other symptoms.  We will repeat the labs please schedule an appointment.

## 2024-02-17 ENCOUNTER — Other Ambulatory Visit (INDEPENDENT_AMBULATORY_CARE_PROVIDER_SITE_OTHER)

## 2024-02-17 ENCOUNTER — Other Ambulatory Visit

## 2024-02-17 DIAGNOSIS — R7989 Other specified abnormal findings of blood chemistry: Secondary | ICD-10-CM

## 2024-02-18 ENCOUNTER — Ambulatory Visit: Payer: Self-pay | Admitting: Nurse Practitioner

## 2024-02-18 DIAGNOSIS — R7989 Other specified abnormal findings of blood chemistry: Secondary | ICD-10-CM

## 2024-02-18 LAB — CBC WITH DIFFERENTIAL/PLATELET
Absolute Lymphocytes: 1346 {cells}/uL (ref 850–3900)
Absolute Monocytes: 505 {cells}/uL (ref 200–950)
Basophils Absolute: 70 {cells}/uL (ref 0–200)
Basophils Relative: 1.2 %
Eosinophils Absolute: 191 {cells}/uL (ref 15–500)
Eosinophils Relative: 3.3 %
HCT: 36.5 % (ref 35.0–45.0)
Hemoglobin: 11.2 g/dL — ABNORMAL LOW (ref 11.7–15.5)
MCH: 28.8 pg (ref 27.0–33.0)
MCHC: 30.7 g/dL — ABNORMAL LOW (ref 32.0–36.0)
MCV: 93.8 fL (ref 80.0–100.0)
MPV: 8.3 fL (ref 7.5–12.5)
Monocytes Relative: 8.7 %
Neutro Abs: 3689 {cells}/uL (ref 1500–7800)
Neutrophils Relative %: 63.6 %
Platelets: 270 10*3/uL (ref 140–400)
RBC: 3.89 10*6/uL (ref 3.80–5.10)
RDW: 12.7 % (ref 11.0–15.0)
Total Lymphocyte: 23.2 %
WBC: 5.8 10*3/uL (ref 3.8–10.8)

## 2024-02-18 LAB — IRON,TIBC AND FERRITIN PANEL
%SAT: 16 % (ref 16–45)
Ferritin: 79 ng/mL (ref 16–288)
Iron: 47 ug/dL (ref 45–160)
TIBC: 291 ug/dL (ref 250–450)

## 2024-02-19 ENCOUNTER — Other Ambulatory Visit: Payer: Self-pay | Admitting: Internal Medicine

## 2024-02-19 DIAGNOSIS — Z9049 Acquired absence of other specified parts of digestive tract: Secondary | ICD-10-CM

## 2024-02-20 ENCOUNTER — Other Ambulatory Visit: Payer: Self-pay | Admitting: Family Medicine

## 2024-02-20 ENCOUNTER — Encounter: Payer: Self-pay | Admitting: Internal Medicine

## 2024-02-20 ENCOUNTER — Telehealth: Payer: Self-pay

## 2024-02-20 ENCOUNTER — Other Ambulatory Visit: Payer: Self-pay | Admitting: Internal Medicine

## 2024-02-20 ENCOUNTER — Other Ambulatory Visit: Payer: Self-pay

## 2024-02-20 DIAGNOSIS — Z9049 Acquired absence of other specified parts of digestive tract: Secondary | ICD-10-CM

## 2024-02-20 MED ORDER — FLUCONAZOLE 150 MG PO TABS
150.0000 mg | ORAL_TABLET | Freq: Every day | ORAL | 0 refills | Status: DC
  Filled 2024-02-20: qty 1, 1d supply, fill #0

## 2024-02-20 NOTE — Telephone Encounter (Signed)
 Copied from CRM [id number]. Topic: Clinical - Medication Refill >> Feb 20, 2024  8:30 AM Adaysia C wrote: Medication: zolpidem  (AMBIEN  CR) 12.5 MG CR tablet  Has the patient contacted their pharmacy? No, pt initiated Rx refill request through providers office (Agent: If no, request that the patient contact the pharmacy for the refill. If patient does not wish to contact the pharmacy document the reason why and proceed with request.) (Agent: If yes, when and what did the pharmacy advise?)  This is the patient's preferred pharmacy:  [facility] [address] [place] [address] Phone: [phone] Fax: [phone]  Is this the correct pharmacy for this prescription? Yes If no, delete pharmacy and type the correct one.   Has the prescription been filled recently? No  Is the patient out of the medication? No  Has the patient been seen for an appointment in the last year OR does the patient have an upcoming appointment? Yes  Can we respond through MyChart? Yes  Agent: Please be advised that Rx refills may take up to 3 business days. We ask that you follow-up with your pharmacy.

## 2024-02-20 NOTE — Telephone Encounter (Signed)
 Duplicate, see medication refill request yesterday.  This encounter was created in error - please disregard.

## 2024-02-20 NOTE — Progress Notes (Signed)
 Patient recently started antibiotics for UTI.  Now calling in with symptoms of yeast infection and requesting medication Diflucan  150 mg po x 1 tab sent to pharmacy If symptoms persist will need to be evaluated in clinic  Valli Gaw, MD

## 2024-02-20 NOTE — Telephone Encounter (Signed)
 Copied from CRM [id number]. Topic: Clinical - Medication Question >> Feb 20, 2024  8:36 AM Adaysia C wrote: Reason for CRM: Patient was taking medication for a UTI and the antibiotics caused a yeast infection; Patient has requested PCP to submit a prescription for Diflucan  to preferred pharmacy: [facility] [address], [place] [address] Phone: [phone]  Fax: [phone]   Please follow up with patient #[phone](work number until 3p ask for "[name]")

## 2024-02-20 NOTE — Telephone Encounter (Signed)
 Sent to PCP ?

## 2024-02-23 ENCOUNTER — Other Ambulatory Visit: Payer: Self-pay

## 2024-02-23 MED FILL — Zolpidem Tartrate Tab ER 12.5 MG: ORAL | 30 days supply | Qty: 30 | Fill #0 | Status: AC

## 2024-02-24 ENCOUNTER — Other Ambulatory Visit: Payer: Self-pay

## 2024-02-25 ENCOUNTER — Encounter: Payer: Self-pay | Admitting: Family Medicine

## 2024-02-25 ENCOUNTER — Telehealth: Payer: Self-pay

## 2024-02-25 ENCOUNTER — Ambulatory Visit: Admitting: Family Medicine

## 2024-02-25 ENCOUNTER — Other Ambulatory Visit: Payer: Self-pay

## 2024-02-25 VITALS — BP 118/62 | HR 69 | Temp 98.1°F | Resp 20 | Ht 63.0 in | Wt 110.0 lb

## 2024-02-25 DIAGNOSIS — R399 Unspecified symptoms and signs involving the genitourinary system: Secondary | ICD-10-CM | POA: Diagnosis not present

## 2024-02-25 DIAGNOSIS — N3001 Acute cystitis with hematuria: Secondary | ICD-10-CM

## 2024-02-25 LAB — URINALYSIS, ROUTINE W REFLEX MICROSCOPIC
Bilirubin Urine: NEGATIVE
Hgb urine dipstick: NEGATIVE
Ketones, ur: NEGATIVE
Nitrite: POSITIVE — AB
Specific Gravity, Urine: 1.02 (ref 1.000–1.030)
Total Protein, Urine: NEGATIVE
Urine Glucose: NEGATIVE
Urobilinogen, UA: 1 (ref 0.0–1.0)
pH: 6 (ref 5.0–8.0)

## 2024-02-25 LAB — POCT URINALYSIS DIP (CLINITEK)
Bilirubin, UA: NEGATIVE
Blood, UA: NEGATIVE
Glucose, UA: NEGATIVE mg/dL
Ketones, POC UA: NEGATIVE mg/dL
Nitrite, UA: POSITIVE — AB
POC PROTEIN,UA: NEGATIVE
Spec Grav, UA: 1.02 (ref 1.010–1.025)
Urobilinogen, UA: 0.2 U/dL
pH, UA: 6 (ref 5.0–8.0)

## 2024-02-25 NOTE — Progress Notes (Signed)
 SUBJECTIVE:   Chief Complaint  Patient presents with   Urinary Tract Infection    Stated that she still has a UTI   HPI Presents for acute visit  Discussed the use of AI scribe software for clinical note transcription with the patient, who gave verbal consent to proceed.  History of Present Illness Jean Brooks "[name]" is a 78 year old female who presents with recurrent urinary tract infections.  She has been experiencing recurrent urinary tract infections over the past few weeks. She completed two courses of Macrobid  (nitrofurantoin ), with the first course finished approximately two weeks prior to the current visit and the second course completed on Feb 18, 2024. Her symptoms resolve while on Macrobid  but return approximately two days after completing the medication.  Her symptoms include increased frequency of urination, dysuria, and a burning sensation during and after urination. She also experiences occasional lower abdominal pain and pressure. She feels unwell at times, with possible low-grade fevers at home, although she has not recorded any fevers.  Her hemoglobin decreased by two points in a month, as noted during a previous lab check. She was supposed to provide a stool sample for further evaluation but has not yet done so.  She has a history of a rash with nitrofurantoin  in the past, but she did not experience this during her recent courses of the medication. She is not currently taking Zetia , a cholesterol medication, as she wanted to manage her other health issues first.     PERTINENT PMH / PSH: As above  OBJECTIVE:  BP 118/62   Pulse 69   Temp 98.1 F (36.7 C)   Resp 20   Ht 5\' 3"  (1.6 m)   Wt 110 lb (49.9 kg)   SpO2 99%   BMI 19.49 kg/m    Physical Exam Vitals reviewed.  Constitutional:      General: She is not in acute distress.    Appearance: Normal appearance. She is normal weight. She is not ill-appearing, toxic-appearing or diaphoretic.  Eyes:      General:        Right eye: No discharge.        Left eye: No discharge.     Conjunctiva/sclera: Conjunctivae normal.  Cardiovascular:     Rate and Rhythm: Normal rate.  Pulmonary:     Effort: Pulmonary effort is normal.  Abdominal:     General: Bowel sounds are normal.  Musculoskeletal:        General: Normal range of motion.  Skin:    General: Skin is warm and dry.  Neurological:     General: No focal deficit present.     Mental Status: She is alert and oriented to person, place, and time. Mental status is at baseline.  Psychiatric:        Mood and Affect: Mood normal.        Behavior: Behavior normal.        Thought Content: Thought content normal.        Judgment: Judgment normal.           01/07/2024    3:19 PM 07/30/2023   11:41 AM 07/03/2023   12:26 PM 08/28/2022    8:14 AM 01/17/2022    3:15 PM  Depression screen PHQ 2/9  Decreased Interest 0 0 1 1 0  Down, Depressed, Hopeless 0 1 2 0 0  PHQ - 2 Score 0 1 3 1  0  Altered sleeping 1 2 2     Tired,  decreased energy 0 1 1    Change in appetite 0 0 1    Feeling bad or failure about yourself  0 1 1    Trouble concentrating 0 2 1    Moving slowly or fidgety/restless 0 0 0    Suicidal thoughts 0 0 0    PHQ-9 Score 1 7 9     Difficult doing work/chores Somewhat difficult Not difficult at all Somewhat difficult        01/07/2024    3:19 PM 07/03/2023   12:26 PM  GAD 7 : Generalized Anxiety Score  Nervous, Anxious, on Edge 0 1  Control/stop worrying 0 2  Worry too much - different things 0 1  Trouble relaxing 0 1  Restless 0 1  Easily annoyed or irritable 0 2  Afraid - awful might happen 0 1  Total GAD 7 Score 0 9  Anxiety Difficulty Not difficult at all Somewhat difficult    ASSESSMENT/PLAN:  Acute cystitis with hematuria Assessment & Plan:  Recurrent UTIs with symptom resolution on nitrofurantoin  but recurrence post-treatment. Previous culture showed E. coli. Current symptoms suggest possible resistance  or different pathogen. - Perform urinalysis and urine culture to assess current infection and potential resistance. - Consider alternative antibiotic therapy based on culture results. - Discuss potential referral to urology if recurrent infections persist after next treatment.  Addendum Urine culture positive for E.Coli Treated with Fosfomycin 3 gm x1 given allergies to penicillins, no significant improvement with 2 courses of nitrofurantoin . Repeat urine culture in 3-4 weeks.  Order placed    Orders: -     POCT URINALYSIS DIP (CLINITEK) -     Urinalysis, Routine w reflex microscopic -     Urine Culture   PDMP reviewed  Return if symptoms worsen or fail to improve, for PCP.  Valli Gaw, MD

## 2024-02-25 NOTE — Patient Instructions (Addendum)
 It was a pleasure meeting you today. Thank you for allowing me to take part in your health care.  Our goals for today as we discussed include:  Urine is  Will send for further evaluation.  Continue to hydrate well Probiotics daily.  If needing to be treated will send in different antibiotic.  Will need repeat urine culture 3 weeks after completion of antibiotics to ensure resolution.    This is a list of the screening recommended for you and due dates:  Health Maintenance  Topic Date Due   COVID-19 Vaccine (8 - 2024-25 season) 02/27/2024*   Flu Shot  04/30/2024   Mammogram  09/10/2024   Colon Cancer Screening  05/25/2027   DTaP/Tdap/Td vaccine (3 - Td or Tdap) 10/02/2033   Pneumonia Vaccine  Completed   DEXA scan (bone density measurement)  Completed   Hepatitis C Screening  Completed   Zoster (Shingles) Vaccine  Completed   HPV Vaccine  Aged Out   Meningitis B Vaccine  Aged Out  *Topic was postponed. The date shown is not the original due date.     If you have any questions or concerns, please do not hesitate to call the office at [phone].  I look forward to our next visit and until then take care and stay safe.  Regards,   Valli Gaw, MD   [facility]

## 2024-02-25 NOTE — Telephone Encounter (Signed)
 Copied from CRM [id number]. Topic: General - Other >> Feb 25, 2024  3:22 PM Howard Macho wrote: Reason for CRM: patient called stating she had an appointment today with MD Sueanne Emerald and she was supposed sent in an antibiotic, but the patient stated it is not at the [facility] pharmacy CB [phone]

## 2024-02-26 ENCOUNTER — Ambulatory Visit: Payer: Self-pay

## 2024-02-26 DIAGNOSIS — N3000 Acute cystitis without hematuria: Secondary | ICD-10-CM

## 2024-02-26 NOTE — Telephone Encounter (Signed)
 Copied from CRM [id number]. Topic: Clinical - Lab/Test Results >> Feb 26, 2024 10:51 AM Adonis Hoot wrote: Reason for CRM: Patient returned call to [facility] Caitlyne Ingham,regarding lab results.   Cb#:[phone] call on her work number ,ask for [place]

## 2024-02-27 ENCOUNTER — Other Ambulatory Visit: Payer: Self-pay

## 2024-02-27 LAB — URINE CULTURE
MICRO NUMBER:: 16508692
SPECIMEN QUALITY:: ADEQUATE

## 2024-02-27 MED ORDER — FOSFOMYCIN TROMETHAMINE 3 G PO PACK
3.0000 g | PACK | Freq: Once | ORAL | 0 refills | Status: AC
  Filled 2024-02-27: qty 3, 1d supply, fill #0

## 2024-02-27 NOTE — Telephone Encounter (Signed)
 Spoke to pt and let her know that medication has been sent in.

## 2024-03-01 ENCOUNTER — Encounter: Payer: Self-pay | Admitting: Family Medicine

## 2024-03-01 DIAGNOSIS — R399 Unspecified symptoms and signs involving the genitourinary system: Secondary | ICD-10-CM | POA: Insufficient documentation

## 2024-03-01 NOTE — Assessment & Plan Note (Addendum)
  Recurrent UTIs with symptom resolution on nitrofurantoin  but recurrence post-treatment. Previous culture showed E. coli. Current symptoms suggest possible resistance or different pathogen. - Perform urinalysis and urine culture to assess current infection and potential resistance. - Consider alternative antibiotic therapy based on culture results. - Discuss potential referral to urology if recurrent infections persist after next treatment.  Addendum Urine culture positive for E.Coli Treated with Fosfomycin 3 gm x1 given allergies to penicillins, no significant improvement with 2 courses of nitrofurantoin . Repeat urine culture in 3-4 weeks.  Order placed

## 2024-03-26 ENCOUNTER — Other Ambulatory Visit: Payer: Self-pay

## 2024-03-26 MED FILL — Zolpidem Tartrate Tab ER 12.5 MG: ORAL | 30 days supply | Qty: 30 | Fill #1 | Status: AC

## 2024-03-29 ENCOUNTER — Other Ambulatory Visit: Payer: Self-pay

## 2024-04-01 ENCOUNTER — Encounter: Payer: Self-pay | Admitting: Internal Medicine

## 2024-04-01 ENCOUNTER — Other Ambulatory Visit: Payer: Self-pay

## 2024-04-01 ENCOUNTER — Ambulatory Visit: Payer: Commercial Managed Care - PPO | Admitting: Internal Medicine

## 2024-04-01 VITALS — BP 116/70 | HR 75 | Ht 63.0 in | Wt 106.6 lb

## 2024-04-01 DIAGNOSIS — E782 Mixed hyperlipidemia: Secondary | ICD-10-CM

## 2024-04-01 DIAGNOSIS — G4700 Insomnia, unspecified: Secondary | ICD-10-CM

## 2024-04-01 DIAGNOSIS — N390 Urinary tract infection, site not specified: Secondary | ICD-10-CM

## 2024-04-01 DIAGNOSIS — S0083XS Contusion of other part of head, sequela: Secondary | ICD-10-CM | POA: Diagnosis not present

## 2024-04-01 LAB — COMPREHENSIVE METABOLIC PANEL WITH GFR
ALT: 15 U/L (ref 0–35)
AST: 18 U/L (ref 0–37)
Albumin: 4.4 g/dL (ref 3.5–5.2)
Alkaline Phosphatase: 53 U/L (ref 39–117)
BUN: 18 mg/dL (ref 6–23)
CO2: 28 meq/L (ref 19–32)
Calcium: 9.2 mg/dL (ref 8.4–10.5)
Chloride: 104 meq/L (ref 96–112)
Creatinine, Ser: 0.69 mg/dL (ref 0.40–1.20)
GFR: 84.09 mL/min (ref >60.00)
Glucose, Bld: 93 mg/dL (ref 70–99)
Potassium: 4 meq/L (ref 3.5–5.1)
Sodium: 138 meq/L (ref 135–145)
Total Bilirubin: 0.8 mg/dL (ref 0.2–1.2)
Total Protein: 6.7 g/dL (ref 6.0–8.3)

## 2024-04-01 LAB — LDL CHOLESTEROL, DIRECT: Direct LDL: 130 mg/dL

## 2024-04-01 LAB — LIPID PANEL
Cholesterol: 212 mg/dL — ABNORMAL HIGH (ref 0–200)
HDL: 62.1 mg/dL (ref >39.00)
LDL Cholesterol: 139 mg/dL — ABNORMAL HIGH (ref 0–99)
NonHDL: 150.38
Total CHOL/HDL Ratio: 3
Triglycerides: 57 mg/dL (ref 0.0–149.0)
VLDL: 11.4 mg/dL (ref 0.0–40.0)

## 2024-04-01 MED ORDER — CYANOCOBALAMIN 1000 MCG/ML IJ SOLN
1000.0000 ug | INTRAMUSCULAR | 3 refills | Status: AC
  Filled 2024-04-01 – 2024-04-14 (×2): qty 3, 90d supply, fill #0
  Filled 2024-07-23: qty 3, 90d supply, fill #1
  Filled 2024-10-22: qty 3, 90d supply, fill #2

## 2024-04-01 MED ORDER — SYRINGE 25G X 1" 3 ML MISC
3 refills | Status: AC
  Filled 2024-04-01: qty 3, fill #0
  Filled 2024-04-14: qty 3, 30d supply, fill #0

## 2024-04-01 NOTE — Assessment & Plan Note (Signed)
 She has deferred statin therapy despite the presence of atherosclerosis on CT due to low  untreated risk of CAD using the FRC  of 8% . She is taking zetia  every other day  Lab Results  Component Value Date   CHOL 219 (H) 07/21/2023   HDL 68.10 07/21/2023   LDLCALC 137 (H) 07/21/2023   LDLDIRECT 141.0 02/20/2023   TRIG 70.0 07/21/2023   CHOLHDL 3 07/21/2023

## 2024-04-01 NOTE — Assessment & Plan Note (Signed)
 She has had 3 documented UTIs since April 9 .  She has no RFs: not sexually active  denies bladder prolapse and urinary incontinence.  checking urinalysis and culture today when asymptomatic and referring to urology for evaluation .

## 2024-04-01 NOTE — Assessment & Plan Note (Addendum)
 Sustained during fall over open dishwasher door in April 2025. .  CT maxillofacial bones noted only soft tissue swelling.  Right zygoma still tender to pressure

## 2024-04-01 NOTE — Progress Notes (Signed)
 Subjective:  Patient ID: Jean Brooks, female    DOB: 1946/08/02  Age: 78 y.o. MRN: [id number]  CC: The primary encounter diagnosis was Hyperlipidemia, mixed. Diagnoses of Recurrent UTI, Insomnia, persistent, and Facial contusion, sequela were also pertinent to this visit.   HPI Jean Brooks presents for  Chief Complaint  Patient presents with   Medical Management of Chronic Issues    6 month follow up     1) Recurrent UTI:  3 documented since April .  Treated in April  fter wet prep was negative . Notes complete remission of dysuria between treatments .  No histor yof intercourse, bladder prolapse , or incontinence .  Defers Urology   2) had a fall  in April split her upper lip on the right,  tripped over open dishwasher door,   right side of face had blunt trauma,  zygoma still tender and the  lip still has a bump that is tender but not scarred or swollen  3) son diagnosed with rectal CA at age 60  yesterday after presenting with rectal bleeding . No prior colon ca screening. Jean Brooks   4)   Outpatient Medications Prior to Visit  Medication Sig Dispense Refill   acetaminophen  (TYLENOL ) 500 MG tablet Take 500 mg by mouth every 6 (six) hours as needed.     ezetimibe  (ZETIA ) 10 MG tablet Take 1 tablet (10 mg total) by mouth daily. (Patient taking differently: Take 10 mg by mouth every other day.) 30 tablet 5   mirtazapine  (REMERON ) 7.5 MG tablet Take 1 tablet (7.5 mg total) by mouth at bedtime. (Patient taking differently: Take 7.5 mg by mouth as needed.) 30 tablet 5   ondansetron  (ZOFRAN -ODT) 4 MG disintegrating tablet Take 1 tablet (4 mg total) by mouth every 8 (eight) hours as needed for nausea or vomiting. 20 tablet 5   sodium chloride  (MURO 128) 5 % ophthalmic ointment Place 1 application  into the right eye at bedtime.     zolpidem  (AMBIEN  CR) 12.5 MG CR tablet Take 1 tablet (12.5 mg total) by mouth at bedtime as needed. 30 tablet 5   cyanocobalamin  (VITAMIN B12)  1000 MCG/ML injection Inject 1 mL (1,000 mcg total) into the muscle every 30 (thirty) days. 10 mL 0   Syringe/Needle, Disp, (SYRINGE 3CC/25GX1) 25G X 1 3 ML MISC Use to give b12 injection 2 each 0   No facility-administered medications prior to visit.    Review of Systems;  Patient denies headache, fevers, malaise, unintentional weight loss, skin rash, eye pain, sinus congestion and sinus pain, sore throat, dysphagia,  hemoptysis , cough, dyspnea, wheezing, chest pain, palpitations, orthopnea, edema, abdominal pain, nausea, melena, diarrhea, constipation, flank pain, dysuria, hematuria, urinary  Frequency, nocturia, numbness, tingling, seizures,  Focal weakness, Loss of consciousness,  Tremor, insomnia, depression, anxiety, and suicidal ideation.      Objective:  BP 116/70   Pulse 75   Ht 5' 3 (1.6 m)   Wt 106 lb 9.6 oz (48.4 kg)   SpO2 95%   BMI 18.88 kg/m   BP Readings from Last 3 Encounters:  04/01/24 116/70  02/25/24 118/62  02/12/24 108/60    Wt Readings from Last 3 Encounters:  04/01/24 106 lb 9.6 oz (48.4 kg)  02/25/24 110 lb (49.9 kg)  02/12/24 108 lb 9.6 oz (49.3 kg)    Physical Exam Vitals reviewed.  Constitutional:      General: She is not in acute distress.    Appearance: Normal  appearance. She is normal weight. She is not ill-appearing, toxic-appearing or diaphoretic.  HENT:     Head: Normocephalic.  Eyes:     General: No scleral icterus.       Right eye: No discharge.        Left eye: No discharge.     Conjunctiva/sclera: Conjunctivae normal.  Cardiovascular:     Rate and Rhythm: Normal rate and regular rhythm.     Heart sounds: Normal heart sounds.  Pulmonary:     Effort: Pulmonary effort is normal. No respiratory distress.     Breath sounds: Normal breath sounds.  Musculoskeletal:        General: Normal range of motion.  Skin:    General: Skin is warm and dry.  Neurological:     General: No focal deficit present.     Mental Status: She is  alert and oriented to person, place, and time. Mental status is at baseline.  Psychiatric:        Mood and Affect: Mood normal.        Behavior: Behavior normal.        Thought Content: Thought content normal.        Judgment: Judgment normal.     Lab Results  Component Value Date   HGBA1C 5.9 08/28/2022   HGBA1C 6.0 08/21/2021   HGBA1C 5.7 05/21/2016    Lab Results  Component Value Date   CREATININE 0.69 04/01/2024   CREATININE 0.76 01/05/2024   CREATININE 0.76 09/01/2023    Lab Results  Component Value Date   WBC 5.8 02/17/2024   HGB 11.2 (L) 02/17/2024   HCT 36.5 02/17/2024   PLT 270 02/17/2024   GLUCOSE 93 04/01/2024   CHOL 212 (H) 04/01/2024   TRIG 57.0 04/01/2024   HDL 62.10 04/01/2024   LDLDIRECT 130.0 04/01/2024   LDLCALC 139 (H) 04/01/2024   ALT 15 04/01/2024   AST 18 04/01/2024   NA 138 04/01/2024   K 4.0 04/01/2024   CL 104 04/01/2024   CREATININE 0.69 04/01/2024   BUN 18 04/01/2024   CO2 28 04/01/2024   TSH 1.45 07/21/2023   HGBA1C 5.9 08/28/2022    No results found.  Assessment & Plan:  .Hyperlipidemia, mixed Assessment & Plan: She has deferred statin therapy despite the presence of atherosclerosis on CT due to low  untreated risk of CAD using the FRC  of 8% . She is taking zetia  every other day  Lab Results  Component Value Date   CHOL 219 (H) 07/21/2023   HDL 68.10 07/21/2023   LDLCALC 137 (H) 07/21/2023   LDLDIRECT 141.0 02/20/2023   TRIG 70.0 07/21/2023   CHOLHDL 3 07/21/2023     Orders: -     Comprehensive metabolic panel with GFR -     Lipid panel -     LDL cholesterol, direct  Recurrent UTI Assessment & Plan: She has had 3 documented UTIs since April 9 .  She has no RFs: not sexually active  denies bladder prolapse and urinary incontinence.  checking urinalysis and culture today when asymptomatic and referring to urology for evaluation .   Orders: -     Urinalysis, Routine w reflex microscopic; Future -     Urine Culture;  Future -     Ambulatory referral to Urology  Insomnia, persistent Assessment & Plan: Has been dependent on ambien  cr for years   She did not tolerate previous trials of trazodone  due to excessive sedation . Advised to add trial of  Relaxium or other  (natural supplement)  as complementary therapy to increase her sleep period from 4-5 hours to 6-8    Facial contusion, sequela Assessment & Plan: Sustained during fall over open dishwasher door in April 2025. .  CT maxillofacial bones noted only soft tissue swelling.  Right zygoma still tender to pressure   Other orders -     Cyanocobalamin ; Inject 1 mL (1,000 mcg total) into the muscle every 30 (thirty) days.  Dispense: 3 mL; Refill: 3 -     Syringe; Use to give b12 injection  Dispense: 3 each; Refill: 3     I spent 34 minutes on the day of this face to face encounter reviewing patient's   prior relevant surgical and non surgical procedures, recent  labs and imaging studies, counseling on underweight management,  reviewing the assessment and plan with patient, and post visit ordering and reviewing of  diagnostics and therapeutics with patient  .   Follow-up: Return in about 6 months (around 10/02/2024).   Verneita LITTIE Kettering, MD

## 2024-04-01 NOTE — Patient Instructions (Addendum)
 Stop skipping meals !  Drink a premier protein shake instead   Please bring back a urine specimen on Monday    Referral to Dr Neale office is in progress

## 2024-04-01 NOTE — Assessment & Plan Note (Signed)
 Has been dependent on ambien  cr for years   She did not tolerate previous trials of trazodone  due to excessive sedation . Advised to add trial of Relaxium or other  (natural supplement)  as complementary therapy to increase her sleep period from 4-5 hours to 6-8

## 2024-04-04 ENCOUNTER — Ambulatory Visit: Payer: Self-pay | Admitting: Internal Medicine

## 2024-04-12 ENCOUNTER — Other Ambulatory Visit: Payer: Self-pay

## 2024-04-13 ENCOUNTER — Other Ambulatory Visit: Payer: Self-pay

## 2024-04-14 ENCOUNTER — Other Ambulatory Visit: Payer: Self-pay

## 2024-04-15 ENCOUNTER — Other Ambulatory Visit: Payer: Self-pay

## 2024-04-26 MED FILL — Zolpidem Tartrate Tab ER 12.5 MG: ORAL | 30 days supply | Qty: 30 | Fill #2 | Status: AC

## 2024-04-27 ENCOUNTER — Other Ambulatory Visit: Payer: Self-pay

## 2024-05-26 MED FILL — Zolpidem Tartrate Tab ER 12.5 MG: ORAL | 30 days supply | Qty: 30 | Fill #3 | Status: AC

## 2024-05-27 ENCOUNTER — Other Ambulatory Visit: Payer: Self-pay

## 2024-06-01 ENCOUNTER — Other Ambulatory Visit: Payer: Self-pay

## 2024-06-14 ENCOUNTER — Other Ambulatory Visit: Payer: Self-pay

## 2024-06-15 ENCOUNTER — Encounter: Payer: Self-pay | Admitting: Internal Medicine

## 2024-06-16 ENCOUNTER — Ambulatory Visit: Payer: Self-pay | Admitting: *Deleted

## 2024-06-16 NOTE — Telephone Encounter (Signed)
 Pt is scheduled for tomorrow

## 2024-06-16 NOTE — Telephone Encounter (Signed)
 FYI Only or Action Required?: FYI only for provider.  Patient was last seen in primary care on 04/01/2024 by Marylynn Verneita CROME, MD.  Called Nurse Triage reporting urinary pain.  Symptoms began a week ago.  Interventions attempted: Nothing.  Symptoms are: gradually worsening.  Triage Disposition: See Physician Within 24 Hours  Patient/caregiver understands and will follow disposition?: Yes   Reason for Disposition  Age > 50 years  Answer Assessment - Initial Assessment Questions 1. SEVERITY: How bad is the pain?  (e.g., Scale 1-10; mild, moderate, or severe)     5-6/10  3. PATTERN: Is pain present every time you urinate or just sometimes?      yes 4. ONSET: When did the painful urination start?      Saturday 5. FEVER: Do you have a fever? If Yes, ask: What is your temperature, how was it measured, and when did it start?     no 6. PAST UTI: Have you had a urine infection before? If Yes, ask: When was the last time? and What happened that time?      yes 7. CAUSE: What do you think is causing the painful urination?  (e.g., UTI, scratch, Herpes sore)     UTI 8. OTHER SYMPTOMS: Do you have any other symptoms? (e.g., blood in urine, flank pain, genital sores, urgency, vaginal discharge)     frequency  Protocols used: Urination Pain - [facility]   Copied from CRM #[id number]. Topic: Clinical - Red Word Triage >> Jun 16, 2024  3:49 PM [place] A wrote: [facility] that prompted transfer to Nurse Triage: Patient has pain when urinating, burning and frequency

## 2024-06-17 ENCOUNTER — Other Ambulatory Visit: Payer: Self-pay

## 2024-06-17 ENCOUNTER — Ambulatory Visit: Admitting: Family Medicine

## 2024-06-17 ENCOUNTER — Ambulatory Visit

## 2024-06-17 VITALS — BP 115/61 | HR 87 | Resp 14 | Ht 63.0 in | Wt 105.5 lb

## 2024-06-17 DIAGNOSIS — R300 Dysuria: Secondary | ICD-10-CM

## 2024-06-17 LAB — POCT URINALYSIS DIPSTICK
Bilirubin, UA: POSITIVE — AB
Blood, UA: NEGATIVE
Glucose, UA: NEGATIVE
Ketones, UA: NEGATIVE
Nitrite, UA: POSITIVE
Protein, UA: POSITIVE — AB
Spec Grav, UA: 1.02 (ref 1.010–1.025)
Urobilinogen, UA: 0.2 U/dL
pH, UA: 6 (ref 5.0–8.0)

## 2024-06-17 MED ORDER — FOSFOMYCIN TROMETHAMINE 3 G PO PACK
3.0000 g | PACK | Freq: Once | ORAL | 0 refills | Status: AC
  Filled 2024-06-17: qty 3, 1d supply, fill #0

## 2024-06-17 NOTE — Progress Notes (Signed)
 Acute visit   Patient: Jean Brooks   DOB: 19-Oct-1945   78 y.o. Female  MRN: [id number] PCP: Marylynn Verneita CROME, MD   Chief Complaint  Patient presents with   Dysuria    Pain after urinating, burning when urinating onset 4-5 days otc: azo last time taken Tuesday morning.  Husband passed away 2 weeks ago and was not drinking enough water .    Subjective     HPI:  Here with urinary symptoms of dysuria and hematuria for the past 4-5 days. Has had recurrent UTIs. Multiple drug allergies to sulfa drugs, penicillin (severe). Macrobid  has not worked in the past. Fosfomycin has worked previously.  Review of systems as noted in HPI.   Objective    BP 115/61   Pulse 87   Resp 14   Ht 5' 3 (1.6 m)   Wt 105 lb 8 oz (47.9 kg)   SpO2 100%   BMI 18.69 kg/m  Physical Exam Constitutional:      Appearance: Normal appearance.  HENT:     Head: Normocephalic and atraumatic.     Mouth/Throat:     Mouth: Mucous membranes are moist.  Eyes:     Pupils: Pupils are equal, round, and reactive to light.  Pulmonary:     Effort: Pulmonary effort is normal.  Abdominal:     General: Abdomen is flat.     Palpations: Abdomen is soft.     Tenderness: There is no right CVA tenderness or left CVA tenderness.  Skin:    General: Skin is warm.  Neurological:     General: No focal deficit present.     Mental Status: She is alert.      Results for orders placed or performed in visit on 06/17/24  POCT Urinalysis Dipstick  Result Value Ref Range   Color, UA Amber    Clarity, UA Slightly Cloudy    Glucose, UA Negative Negative   Bilirubin, UA Positive (A)    Ketones, UA Negative    Spec Grav, UA 1.020 1.010 - 1.025   Blood, UA Negative    pH, UA 6.0 5.0 - 8.0   Protein, UA Positive (A) Negative   Urobilinogen, UA 0.2 0.2 or 1.0 E.U./dL   Nitrite, UA Positive    Leukocytes, UA Trace (A) Negative   Appearance     Odor      Assessment & Plan     Problem List Items Addressed This  Visit       Other   Dysuria - Primary   Patient with 4 day hx of dysuria and urinary frequency. Hx of recurrent UTIs. POC urine showed + nitrites, trace LE. Will treat for uncomplicated UTI. Unfortunately patient has multiple drug allergies (penicillins, sulfa drugs) and previously resistant to Macrobid .  - Will treat with fosfomycin 3g x1 - Will send urine for culture - Encouraged continued hydration - Return precautions given      Relevant Medications   fosfomycin (MONUROL ) 3 g PACK   Other Relevant Orders   POCT Urinalysis Dipstick (Completed)   Urine Culture     Meds ordered this encounter  Medications   fosfomycin (MONUROL ) 3 g PACK    Sig: Take 3 g by mouth once for 1 dose.    Dispense:  3 g    Refill:  0     No follow-ups on file.      Isaiah DELENA Pepper, MD  [facility] [phone] (phone) [phone] (fax)

## 2024-06-17 NOTE — Assessment & Plan Note (Signed)
 Patient with 4 day hx of dysuria and urinary frequency. Hx of recurrent UTIs. POC urine showed + nitrites, trace LE. Will treat for uncomplicated UTI. Unfortunately patient has multiple drug allergies (penicillins, sulfa drugs) and previously resistant to Macrobid .  - Will treat with fosfomycin 3g x1 - Will send urine for culture - Encouraged continued hydration - Return precautions given

## 2024-06-18 DIAGNOSIS — H903 Sensorineural hearing loss, bilateral: Secondary | ICD-10-CM | POA: Diagnosis not present

## 2024-06-18 DIAGNOSIS — H6123 Impacted cerumen, bilateral: Secondary | ICD-10-CM | POA: Diagnosis not present

## 2024-06-21 ENCOUNTER — Ambulatory Visit: Payer: Self-pay

## 2024-06-21 LAB — URINE CULTURE

## 2024-06-21 LAB — SPECIMEN STATUS REPORT

## 2024-06-23 ENCOUNTER — Ambulatory Visit (INDEPENDENT_AMBULATORY_CARE_PROVIDER_SITE_OTHER): Admitting: Physician Assistant

## 2024-06-23 ENCOUNTER — Other Ambulatory Visit: Payer: Self-pay

## 2024-06-23 VITALS — BP 138/70 | HR 69 | Ht 63.0 in | Wt 106.0 lb

## 2024-06-23 DIAGNOSIS — N390 Urinary tract infection, site not specified: Secondary | ICD-10-CM | POA: Diagnosis not present

## 2024-06-23 LAB — URINALYSIS, COMPLETE
Bilirubin, UA: NEGATIVE
Glucose, UA: NEGATIVE
Ketones, UA: NEGATIVE
Leukocytes,UA: NEGATIVE
Nitrite, UA: NEGATIVE
Protein,UA: NEGATIVE
RBC, UA: NEGATIVE
Specific Gravity, UA: 1.015 (ref 1.005–1.030)
Urobilinogen, Ur: 0.2 mg/dL (ref 0.2–1.0)
pH, UA: 6 (ref 5.0–7.5)

## 2024-06-23 LAB — MICROSCOPIC EXAMINATION: RBC, Urine: NONE SEEN /HPF (ref 0–2)

## 2024-06-23 LAB — BLADDER SCAN AMB NON-IMAGING

## 2024-06-23 MED ORDER — METHENAMINE HIPPURATE 1 G PO TABS
1.0000 g | ORAL_TABLET | Freq: Two times a day (BID) | ORAL | 11 refills | Status: DC
  Filled 2024-06-23: qty 60, 30d supply, fill #0

## 2024-06-23 MED ORDER — ESTRADIOL 0.1 MG/GM VA CREA
TOPICAL_CREAM | VAGINAL | 12 refills | Status: AC
  Filled 2024-06-23: qty 42.5, 90d supply, fill #0

## 2024-06-23 NOTE — Patient Instructions (Addendum)
 Ultrasound scheduling: [phone]  Recurrent UTI Prevention Strategies  Stay well hydrated. Start taking over-the-counter cranberry and d-mannose supplements for urinary tract health according to the packaging instructions. Start taking an over-the-counter probiotic containing the bacterial species called Lactobacillus. Take this daily. Start vaginal estrogen cream. Apply a pea-sized amount around the opening of the urethra every day for 2 weeks, then three times weekly forever.

## 2024-06-23 NOTE — Progress Notes (Unsigned)
 06/23/2024 9:43 AM   Jean Brooks 1946-04-29 [id number]  CC: Chief Complaint  Patient presents with   New Patient (Initial Visit)   Establish Care   Recurrent UTI   HPI: Jean Brooks is a 78 y.o. female with PMH of diverticulitis and CAD who presents today as a new patient for evaluation of recurrent UTIs.   Today she reports ***  Urine culture history as follows: 01/07/2024: Aerococcus urinae (no pyuria or hematuria at the time) 02/12/2024: E. Coli (>50 WBC/hpf, 21-50 RBC/hpf, and 5-10 squamous epithelial cells/hpf) 02/25/2024: E. Coli (11-20 WBC/hpf) 06/17/2024: E. Coli (trace leukocytes)  CTAP with contrast dated 03/25/2022 showed a left upper pole simple renal cyst, no hydronephrosis, no urolithiasis.  In-office UA urine microscopy pan negative. PVR 2mL.  PMH: Past Medical History:  Diagnosis Date   Abdominal aortic atherosclerosis    Arthritis    bil hands/thumbs   Bradycardia    Chest pain, non-cardiac 2010   a. stress echo 2010: nl treadmill EKG w/o evidence of ischemia or arrhythmia, good exercise tolerance for age, normal stress echo images w/o evidence of myocardial ischemia; b. baseline echo with EF 55% peak stress showed nl LV systolic function wtih EF 65% w/o evidence of HK   Chronic colitis    Chronic venous insufficiency    Complication of anesthesia    Coronary artery disease    Cyst, ovarian 12/29/2011   Serous cystadenomas, bilateral.  S/pBSO May 2019 ([facility])    Diverticulitis 02/19/2022   Diverticulitis of colon with hemorrhage 04/2008   Hospitalized [facility]   Family history of adverse reaction to anesthesia    mom and maternal grandmother-agitated   GERD (gastroesophageal reflux disease)    h/o   H/O syncope    a. echo 2010: EF 60%, LV nl size, RV nl size & fxn, atria nl size, aorta nl, no pericardial effusion, aortic valve nl, mitral valve nl w/ mild insufficiency, tricuspid valve nl w/ mild insufficiency, pulmonic valve nl   Heart murmur     asymptomatic-in pt's 20's   Lymphedema    Migraine headache    h/o migraines   Normocytic anemia    PONV (postoperative nausea and vomiting)    Scoliosis of thoracic spine    Shingles    SVT (supraventricular tachycardia)    Syncope and collapse     Surgical History: Past Surgical History:  Procedure Laterality Date   ABDOMINAL HYSTERECTOMY  1991   APPENDECTOMY  1963   COLONOSCOPY     COLONOSCOPY WITH PROPOFOL  N/A 05/24/2022   Procedure: COLONOSCOPY WITH PROPOFOL ;  Surgeon: Therisa Bi, MD;  Location: [facility] ENDOSCOPY;  Service: Gastroenterology;  Laterality: N/A;   ESOPHAGOGASTRODUODENOSCOPY     LAPAROSCOPIC BILATERAL SALPINGO OOPHERECTOMY Bilateral 02/20/2018   Procedure: LAPAROSCOPIC BILATERAL SALPINGO OOPHORECTOMY;  Surgeon: Ward, Mitzie JAYSON, MD;  Location: [facility] ORS;  Service: Gynecology;  Laterality: Bilateral;   LAPAROSCOPIC LYSIS OF ADHESIONS  02/20/2018   Procedure: LAPAROSCOPIC LYSIS OF ADHESIONS;  Surgeon: Ward, Mitzie JAYSON, MD;  Location: [facility] ORS;  Service: Gynecology;;   TONSILLECTOMY     age 78 or 6    Home Medications:  Allergies as of 06/23/2024       Reactions   Omnicef  [cefdinir ] Shortness Of Breath, Palpitations   Sulfa Antibiotics Hives   Atorvastatin  Nausea Only   Flagyl  [metronidazole ] Other (See Comments)   Can't remember but remembers it was not a good experience   Aspirin Rash   Azithromycin Rash   Within the hour  Ciprofloxacin  Nausea Only, Rash   After 3 day   Erythromycin Rash   Confirmed Feb 2019   Ibuprofen Rash   Nitrofurantoin  Rash   Penicillins Rash   Has patient had a PCN reaction causing immediate rash, facial/tongue/throat swelling, SOB or lightheadedness with hypotension: Yes Has patient had a PCN reaction causing severe rash involving mucus membranes or skin necrosis: No Has patient had a PCN reaction that required hospitalization: No Has patient had a PCN reaction occurring within the last 10 years: No If all of the above answers are  NO, then may proceed with Cephalosporin use.        Medication List        Accurate as of June 23, 2024  9:43 AM. If you have any questions, ask your nurse or doctor.          acetaminophen  500 MG tablet Commonly known as: TYLENOL  Take 500 mg by mouth every 6 (six) hours as needed.   cyanocobalamin  1000 MCG/ML injection Commonly known as: VITAMIN B12 Inject 1 mL (1,000 mcg total) into the muscle every 30 (thirty) days.   ezetimibe  10 MG tablet Commonly known as: ZETIA  Take 1 tablet (10 mg total) by mouth daily.   ondansetron  4 MG disintegrating tablet Commonly known as: ZOFRAN -ODT Dissolve 1 tablet (4 mg total) on the tongue every 8 (eight) hours as needed for nausea or vomiting. (Take 1 tablet (4 mg total) by mouth every 8 (eight) hours as needed for nausea or vomiting.)   sodium chloride  5 % ophthalmic ointment Commonly known as: MURO 128 Place 1 application  into the right eye at bedtime.   SYRINGE 3CC/25GX1 25G X 1 3 ML Misc Use to give b12 injection   zolpidem  12.5 MG CR tablet Commonly known as: AMBIEN  CR Take 1 tablet (12.5 mg total) by mouth at bedtime as needed.        Allergies:  Allergies  Allergen Reactions   Omnicef  [Cefdinir ] Shortness Of Breath and Palpitations   Sulfa Antibiotics Hives   Atorvastatin  Nausea Only   Flagyl  [Metronidazole ] Other (See Comments)    Can't remember but remembers it was not a good experience   Aspirin Rash   Azithromycin Rash    Within the hour   Ciprofloxacin  Nausea Only and Rash    After 3 day   Erythromycin Rash    Confirmed Feb 2019   Ibuprofen Rash   Nitrofurantoin  Rash   Penicillins Rash    Has patient had a PCN reaction causing immediate rash, facial/tongue/throat swelling, SOB or lightheadedness with hypotension: Yes Has patient had a PCN reaction causing severe rash involving mucus membranes or skin necrosis: No Has patient had a PCN reaction that required hospitalization: No Has patient  had a PCN reaction occurring within the last 10 years: No If all of the above answers are NO, then may proceed with Cephalosporin use.     Family History: Family History  Problem Relation Age of Onset   COPD Mother 10       respiratory failure/fibrosis, seizures   Stroke Mother    Hypertension Mother    Supraventricular tachycardia Mother    COPD Father 59   Mental illness Father        Parkinson's Dementia   Aneurysm Father 82   Stroke Son 58       hypertensive, left brain    Heart attack Maternal Grandfather 4       passed   Stroke Maternal Grandmother    Breast  cancer Maternal Aunt     Social History:   reports that she has never smoked. She has never used smokeless tobacco. She reports that she does not drink alcohol and does not use drugs.  Physical Exam: BP 138/70 (BP Location: Left Arm, Patient Position: Sitting, Cuff Size: Normal)   Pulse 69   Ht 5' 3 (1.6 m)   Wt 106 lb (48.1 kg)   SpO2 98%   BMI 18.78 kg/m   Constitutional:  Alert and oriented, no acute distress, nontoxic appearing HEENT: [place], AT Cardiovascular: No clubbing, cyanosis, or edema Respiratory: Normal respiratory effort, no increased work of breathing GI: Abdomen is soft, nontender, nondistended, no abdominal masses GU: No CVA tenderness Lymph: No cervical or inguinal lymphadenopathy Skin: No rashes, bruises or suspicious lesions Neurologic: Grossly intact, no focal deficits, moving all 4 extremities Psychiatric: Normal mood and affect  Laboratory Data: Lab Results  Component Value Date   WBC 5.8 02/17/2024   HGB 11.2 (L) 02/17/2024   HCT 36.5 02/17/2024   MCV 93.8 02/17/2024   PLT 270 02/17/2024    Lab Results  Component Value Date   CREATININE 0.69 04/01/2024    CrCl cannot be calculated (Patient's most recent lab result is older than the maximum 21 days allowed.).  Results for orders placed or performed in visit on 06/23/24  Bladder Scan (Post Void Residual) in office    Collection Time: 06/23/24  9:18 AM  Result Value Ref Range   Scan Result 2ml     Pertinent Imaging: KUB, ***: *** No results found for this or any previous visit.  No results found for this or any previous visit.  No results found for this or any previous visit.  No results found for this or any previous visit.  No results found for this or any previous visit.  No results found for this or any previous visit.  No results found for this or any previous visit.  No results found for this or any previous visit.   I personally reviewed the images referenced above and note ***.  Assessment & Plan:   1. Recurrent UTI (Primary) *** - Urinalysis, Complete - Bladder Scan (Post Void Residual) in office   No follow-ups on file.  Lucie Hones, PA-C  [facility]  [address], [address] [place], [place] [address] [phone]

## 2024-06-24 MED FILL — Zolpidem Tartrate Tab ER 12.5 MG: ORAL | 30 days supply | Qty: 30 | Fill #4 | Status: CN

## 2024-06-25 ENCOUNTER — Other Ambulatory Visit: Payer: Self-pay

## 2024-06-28 DIAGNOSIS — L821 Other seborrheic keratosis: Secondary | ICD-10-CM | POA: Diagnosis not present

## 2024-06-29 ENCOUNTER — Other Ambulatory Visit: Payer: Self-pay

## 2024-06-29 MED ORDER — FLUZONE HIGH-DOSE 0.5 ML IM SUSY
0.5000 mL | PREFILLED_SYRINGE | Freq: Once | INTRAMUSCULAR | 0 refills | Status: AC
  Filled 2024-06-29: qty 0.5, 1d supply, fill #0

## 2024-06-29 MED FILL — Zolpidem Tartrate Tab ER 12.5 MG: ORAL | 30 days supply | Qty: 30 | Fill #4 | Status: AC

## 2024-06-30 ENCOUNTER — Ambulatory Visit
Admission: RE | Admit: 2024-06-30 | Discharge: 2024-06-30 | Disposition: A | Discharge status: Home / Self Care | Source: Ambulatory Visit | Attending: Physician Assistant | Admitting: Physician Assistant

## 2024-06-30 ENCOUNTER — Telehealth: Payer: Self-pay | Admitting: Physician Assistant

## 2024-06-30 DIAGNOSIS — N390 Urinary tract infection, site not specified: Secondary | ICD-10-CM | POA: Insufficient documentation

## 2024-06-30 DIAGNOSIS — N1330 Unspecified hydronephrosis: Secondary | ICD-10-CM | POA: Diagnosis not present

## 2024-06-30 NOTE — Telephone Encounter (Signed)
 Pt came into the office and wanted to let you know that she had to stop taking the Hiprex . It broke her out into a rash and diarrhea.

## 2024-07-01 NOTE — Telephone Encounter (Signed)
 I am sorry to hear this.  Agree with stopping Hiprex .  I have added it to her allergy list.  Keep plans for kidney ultrasound and continue estrogen cream, drinking fluids, cranberry supplements, d-mannose supplements, and probiotics.  She may keep follow-up as scheduled.

## 2024-07-02 NOTE — Telephone Encounter (Signed)
 Phone call attempted. Voicemail left stating: Per Jean Brooks, she agrees to stop the hiprex  and she will add that to her allergy list along with symptoms Keep plans for kidney ultrasound and continue estrogen cream, drinking fluids, cranberry supplements, d-mannose supplements, and probiotics. She may keep follow-up as scheduled. Clinic telephone number provided for any additional questions she may have.

## 2024-07-05 ENCOUNTER — Other Ambulatory Visit: Payer: Self-pay

## 2024-07-05 MED ORDER — SCOPOLAMINE 1 MG/3DAYS TD PT72
1.0000 | MEDICATED_PATCH | TRANSDERMAL | 0 refills | Status: AC
  Filled 2024-07-05: qty 5, 15d supply, fill #0

## 2024-07-05 NOTE — Telephone Encounter (Signed)
 Phone call attempted. Voicemail left with clinic telephone number asking patient to return call.

## 2024-07-05 NOTE — Telephone Encounter (Signed)
 Pt presented herself in clinic, informed of Sam's recommendations. Pt voiced understanding.

## 2024-07-05 NOTE — Progress Notes (Signed)
 Requested scopalamine for upcoming travel for motion sickness

## 2024-07-07 ENCOUNTER — Ambulatory Visit: Payer: Self-pay | Admitting: Physician Assistant

## 2024-07-13 ENCOUNTER — Telehealth: Payer: Self-pay

## 2024-07-13 ENCOUNTER — Other Ambulatory Visit: Payer: Self-pay | Admitting: Internal Medicine

## 2024-07-13 DIAGNOSIS — N1330 Unspecified hydronephrosis: Secondary | ICD-10-CM

## 2024-07-13 DIAGNOSIS — Z1231 Encounter for screening mammogram for malignant neoplasm of breast: Secondary | ICD-10-CM

## 2024-07-13 NOTE — Addendum Note (Signed)
 Addended by: Ashly Yepez P on: 07/13/2024 04:48 PM   Modules accepted: Orders

## 2024-07-13 NOTE — Telephone Encounter (Signed)
 Left voicemail with info on making apt for imaging.

## 2024-07-13 NOTE — Telephone Encounter (Signed)
 Patient walked into clinic today to f/u on our voicemail from earlier. I let her know the findings of the US . She stated that it was tender and uncomfortable on that side during the ultrasound but no pain any other time and she doesn't see any blood in her urine. I told her we would reach back out to her after I let PA know about symptoms.

## 2024-07-13 NOTE — Telephone Encounter (Signed)
 I am glad to hear that she is not having any obvious symptoms of the swelling in her right kidney.  That said, I would like to get a CT urogram to further evaluate this.  I have placed the order, please give her the phone number for scheduling and have her keep her follow-up visit with me next month.

## 2024-07-23 ENCOUNTER — Other Ambulatory Visit: Payer: Self-pay

## 2024-08-05 DIAGNOSIS — H2513 Age-related nuclear cataract, bilateral: Secondary | ICD-10-CM | POA: Diagnosis not present

## 2024-08-05 DIAGNOSIS — H04123 Dry eye syndrome of bilateral lacrimal glands: Secondary | ICD-10-CM | POA: Diagnosis not present

## 2024-08-05 MED FILL — Zolpidem Tartrate Tab ER 12.5 MG: ORAL | 30 days supply | Qty: 30 | Fill #5 | Status: AC

## 2024-08-06 ENCOUNTER — Other Ambulatory Visit: Payer: Self-pay

## 2024-08-23 ENCOUNTER — Ambulatory Visit: Admitting: Physician Assistant

## 2024-09-01 ENCOUNTER — Other Ambulatory Visit: Payer: Self-pay | Admitting: Internal Medicine

## 2024-09-01 DIAGNOSIS — Z9049 Acquired absence of other specified parts of digestive tract: Secondary | ICD-10-CM

## 2024-09-02 ENCOUNTER — Other Ambulatory Visit: Payer: Self-pay | Admitting: Internal Medicine

## 2024-09-02 ENCOUNTER — Other Ambulatory Visit: Payer: Self-pay

## 2024-09-02 DIAGNOSIS — Z9049 Acquired absence of other specified parts of digestive tract: Secondary | ICD-10-CM

## 2024-09-02 MED ORDER — ZOLPIDEM TARTRATE ER 12.5 MG PO TBCR
12.5000 mg | EXTENDED_RELEASE_TABLET | Freq: Every evening | ORAL | 1 refills | Status: DC | PRN
  Filled 2024-09-02 – 2024-09-06 (×2): qty 30, 30d supply, fill #0
  Filled 2024-10-04: qty 30, 30d supply, fill #1

## 2024-09-06 ENCOUNTER — Other Ambulatory Visit: Payer: Self-pay

## 2024-09-07 ENCOUNTER — Ambulatory Visit: Admitting: Physician Assistant

## 2024-09-13 ENCOUNTER — Encounter

## 2024-09-14 ENCOUNTER — Ambulatory Visit
Admission: RE | Admit: 2024-09-14 | Discharge: 2024-09-14 | Disposition: A | Discharge status: Home / Self Care | Source: Ambulatory Visit | Attending: Internal Medicine | Admitting: Internal Medicine

## 2024-09-14 DIAGNOSIS — Z1231 Encounter for screening mammogram for malignant neoplasm of breast: Secondary | ICD-10-CM | POA: Insufficient documentation

## 2024-09-15 ENCOUNTER — Other Ambulatory Visit: Payer: Self-pay | Admitting: Gastroenterology

## 2024-09-16 ENCOUNTER — Ambulatory Visit (INDEPENDENT_AMBULATORY_CARE_PROVIDER_SITE_OTHER): Admitting: Physician Assistant

## 2024-09-16 ENCOUNTER — Other Ambulatory Visit: Payer: Self-pay

## 2024-09-16 ENCOUNTER — Ambulatory Visit: Admitting: Physician Assistant

## 2024-09-16 VITALS — BP 136/73 | HR 76 | Ht 62.0 in | Wt 98.6 lb

## 2024-09-16 DIAGNOSIS — N1330 Unspecified hydronephrosis: Secondary | ICD-10-CM

## 2024-09-16 DIAGNOSIS — N390 Urinary tract infection, site not specified: Secondary | ICD-10-CM | POA: Diagnosis not present

## 2024-09-16 LAB — URINALYSIS, COMPLETE
Bilirubin, UA: NEGATIVE
Glucose, UA: NEGATIVE
Ketones, UA: NEGATIVE
Nitrite, UA: POSITIVE — AB
Specific Gravity, UA: 1.02 (ref 1.005–1.030)
Urobilinogen, Ur: 2 mg/dL — ABNORMAL HIGH (ref 0.2–1.0)
pH, UA: 6.5 (ref 5.0–7.5)

## 2024-09-16 LAB — MICROSCOPIC EXAMINATION: WBC, UA: >30 /HPF — AB (ref 0–5)

## 2024-09-16 MED ORDER — FOSFOMYCIN TROMETHAMINE 3 G PO PACK
3.0000 g | PACK | Freq: Once | ORAL | 0 refills | Status: AC
  Filled 2024-09-16: qty 3, 1d supply, fill #0

## 2024-09-16 NOTE — Progress Notes (Signed)
 09/16/2024 12:00 PM   Jean Brooks March 16, 1946 [id number]  CC: Chief Complaint  Patient presents with   Recurrent UTI   HPI: Jean Brooks is a 78 y.o. female with PMH diverticulitis, CAD, CKD, recurrent UTI, and right hydronephrosis of unclear etiology who presents today for evaluation of possible UTI.   Today she reports about 5 days of dysuria and urinary frequency.  She has been taking Azo for her symptoms.  No fevers or flank pain.  I ordered a renal ultrasound on her in October, which showed moderate right hydronephrosis.  I recommended CT urogram for further evaluation.  This has not yet been done.  She is preparing for her first Christmas without her husband, a former patient of mine, who passed away suddenly earlier this year.  In-office UA today positive for trace protein, 2+ blood, 2.0 EU/DL urobilinogen, nitrites, and 2+ leukocytes; urine microscopy with >30 WBCs/HPF, 3-10 RBCs/HPF, and many bacteria.   PMH: Past Medical History:  Diagnosis Date   Abdominal aortic atherosclerosis    Arthritis    bil hands/thumbs   Bradycardia    Chest pain, non-cardiac 2010   a. stress echo 2010: nl treadmill EKG w/o evidence of ischemia or arrhythmia, good exercise tolerance for age, normal stress echo images w/o evidence of myocardial ischemia; b. baseline echo with EF 55% peak stress showed nl LV systolic function wtih EF 65% w/o evidence of HK   Chronic colitis    Chronic venous insufficiency    Complication of anesthesia    Coronary artery disease    Cyst, ovarian 12/29/2011   Serous cystadenomas, bilateral.  S/pBSO May 2019 ([facility])    Diverticulitis 02/19/2022   Diverticulitis of colon with hemorrhage 04/2008   Hospitalized [facility]   Family history of adverse reaction to anesthesia    mom and maternal grandmother-agitated   GERD (gastroesophageal reflux disease)    h/o   H/O syncope    a. echo 2010: EF 60%, LV nl size, RV nl size & fxn, atria nl size, aorta nl, no  pericardial effusion, aortic valve nl, mitral valve nl w/ mild insufficiency, tricuspid valve nl w/ mild insufficiency, pulmonic valve nl   Heart murmur    asymptomatic-in pt's 20's   Lymphedema    Migraine headache    h/o migraines   Normocytic anemia    PONV (postoperative nausea and vomiting)    Scoliosis of thoracic spine    Shingles    SVT (supraventricular tachycardia)    Syncope and collapse     Surgical History: Past Surgical History:  Procedure Laterality Date   ABDOMINAL HYSTERECTOMY  1991   APPENDECTOMY  1963   COLONOSCOPY     COLONOSCOPY WITH PROPOFOL  N/A 05/24/2022   Procedure: COLONOSCOPY WITH PROPOFOL ;  Surgeon: Therisa Bi, MD;  Location: [facility] ENDOSCOPY;  Service: Gastroenterology;  Laterality: N/A;   ESOPHAGOGASTRODUODENOSCOPY     LAPAROSCOPIC BILATERAL SALPINGO OOPHERECTOMY Bilateral 02/20/2018   Procedure: LAPAROSCOPIC BILATERAL SALPINGO OOPHORECTOMY;  Surgeon: Ward, Mitzie JAYSON, MD;  Location: [facility] ORS;  Service: Gynecology;  Laterality: Bilateral;   LAPAROSCOPIC LYSIS OF ADHESIONS  02/20/2018   Procedure: LAPAROSCOPIC LYSIS OF ADHESIONS;  Surgeon: Ward, Mitzie JAYSON, MD;  Location: [facility] ORS;  Service: Gynecology;;   TONSILLECTOMY     age 57 or 6    Home Medications:  Allergies as of 09/16/2024       Reactions   Omnicef  [cefdinir ] Shortness Of Breath, Palpitations   Sulfa Antibiotics Hives   Atorvastatin  Nausea Only  Flagyl  [metronidazole ] Other (See Comments)   Can't remember but remembers it was not a good experience   Aspirin Rash   Azithromycin Rash   Within the hour   Ciprofloxacin  Nausea Only, Rash   After 3 day   Erythromycin Rash   Confirmed Feb 2019   Hiprex  [methenamine ] Rash   Ibuprofen Rash   Nitrofurantoin  Rash   Penicillins Rash   Has patient had a PCN reaction causing immediate rash, facial/tongue/throat swelling, SOB or lightheadedness with hypotension: Yes Has patient had a PCN reaction causing severe rash involving mucus membranes or  skin necrosis: No Has patient had a PCN reaction that required hospitalization: No Has patient had a PCN reaction occurring within the last 10 years: No If all of the above answers are NO, then may proceed with Cephalosporin use.        Medication List        Accurate as of September 16, 2024 12:00 PM. If you have any questions, ask your nurse or doctor.          acetaminophen  500 MG tablet Commonly known as: TYLENOL  Take 500 mg by mouth every 6 (six) hours as needed.   cyanocobalamin  1000 MCG/ML injection Commonly known as: VITAMIN B12 Inject 1 mL (1,000 mcg total) into the muscle every 30 (thirty) days.   estradiol  0.1 MG/GM vaginal cream Commonly known as: ESTRACE  Apply one pea-sized amount around the opening of the urethra daily for 2 weeks, then 3 times weekly moving forward.   ezetimibe  10 MG tablet Commonly known as: ZETIA  Take 1 tablet (10 mg total) by mouth daily.   fosfomycin  3 g Pack Commonly known as: MONUROL  Take 3 g by mouth once for 1 dose.   ondansetron  4 MG disintegrating tablet Commonly known as: ZOFRAN -ODT Dissolve 1 tablet (4 mg total) on the tongue every 8 (eight) hours as needed for nausea or vomiting. (Take 1 tablet (4 mg total) by mouth every 8 (eight) hours as needed for nausea or vomiting.)   scopolamine  1 MG/3DAYS Commonly known as: TRANSDERM-SCOP Place 1 patch (1 mg total) onto the skin every third day for 15 days   sodium chloride  5 % ophthalmic ointment Commonly known as: MURO 128 Place 1 application  into the right eye at bedtime.   SYRINGE 3CC/25GX1 25G X 1 3 ML Misc Use to give b12 injection   zolpidem  12.5 MG CR tablet Commonly known as: AMBIEN  CR Take 1 tablet (12.5 mg total) by mouth at bedtime as needed.        Allergies:  Allergies[1]  Family History: Family History  Problem Relation Age of Onset   COPD Mother 44       respiratory failure/fibrosis, seizures   Stroke Mother    Hypertension Mother     Supraventricular tachycardia Mother    COPD Father 95   Mental illness Father        Parkinson's Dementia   Aneurysm Father 21   Stroke Son 66       hypertensive, left brain    Heart attack Maternal Grandfather 43       passed   Stroke Maternal Grandmother    Breast cancer Maternal Aunt     Social History:   reports that she has never smoked. She has never used smokeless tobacco. She reports that she does not drink alcohol and does not use drugs.  Physical Exam: BP 136/73   Pulse 76   Ht 5' 2 (1.575 m)   Wt 98 lb  9.6 oz (44.7 kg)   BMI 18.03 kg/m   Constitutional:  Alert and oriented, no acute distress, nontoxic appearing HEENT: [place], AT Cardiovascular: No clubbing, cyanosis, or edema Respiratory: Normal respiratory effort, no increased work of breathing Skin: No rashes, bruises or suspicious lesions Neurologic: Grossly intact, no focal deficits, moving all 4 extremities Psychiatric: Normal mood and affect  Laboratory Data: Results for orders placed or performed in visit on 09/16/24  Microscopic Examination   Collection Time: 09/16/24 11:03 AM   Urine  Result Value Ref Range   WBC, UA >30 (A) 0 - 5 /hpf   RBC, Urine 3-10 (A) 0 - 2 /hpf   Epithelial Cells (non renal) 0-10 0 - 10 /hpf   Bacteria, UA Many (A) None seen/Few  Urinalysis, Complete   Collection Time: 09/16/24 11:03 AM  Result Value Ref Range   Specific Gravity, UA 1.020 1.005 - 1.030   pH, UA 6.5 5.0 - 7.5   Color, UA Amber (A) Yellow   Appearance Ur Slightly cloudy Clear   Leukocytes,UA 2+ (A) Negative   Protein,UA Trace Negative/Trace   Glucose, UA Negative Negative   Ketones, UA Negative Negative   RBC, UA 2+ (A) Negative   Bilirubin, UA Negative Negative   Urobilinogen, Ur 2.0 (H) 0.2 - 1.0 mg/dL   Nitrite, UA Positive (A) Negative   Microscopic Examination See below:    Assessment & Plan:   1. Recurrent UTI (Primary) UA appears grossly infected, will send for culture.  She has multiple  medication allergies, will start empiric fosfomycin . - Urinalysis, Complete - CULTURE, URINE COMPREHENSIVE - fosfomycin  (MONUROL ) 3 g PACK; Take 3 g by mouth once for 1 dose.  Dispense: 3 g; Refill: 0  2. Hydronephrosis of right kidney She needs CT urogram at her convenience.  Will continue to follow.  Return for Keep follow-up as scheduled.  Lucie Hones, PA-C  [facility] [address] [place], [place] [address] [phone]      [1]  Allergies Allergen Reactions   Omnicef  [Cefdinir ] Shortness Of Breath and Palpitations   Sulfa Antibiotics Hives   Atorvastatin  Nausea Only   Flagyl  [Metronidazole ] Other (See Comments)    Can't remember but remembers it was not a good experience   Aspirin Rash   Azithromycin Rash    Within the hour   Ciprofloxacin  Nausea Only and Rash    After 3 day   Erythromycin Rash    Confirmed Feb 2019   Hiprex  [Methenamine ] Rash   Ibuprofen Rash   Nitrofurantoin  Rash   Penicillins Rash    Has patient had a PCN reaction causing immediate rash, facial/tongue/throat swelling, SOB or lightheadedness with hypotension: Yes Has patient had a PCN reaction causing severe rash involving mucus membranes or skin necrosis: No Has patient had a PCN reaction that required hospitalization: No Has patient had a PCN reaction occurring within the last 10 years: No If all of the above answers are NO, then may proceed with Cephalosporin use.

## 2024-09-20 ENCOUNTER — Ambulatory Visit: Payer: Self-pay | Admitting: Urology

## 2024-09-20 LAB — CULTURE, URINE COMPREHENSIVE

## 2024-10-04 ENCOUNTER — Other Ambulatory Visit: Payer: Self-pay

## 2024-10-05 ENCOUNTER — Ambulatory Visit: Admitting: Physician Assistant

## 2024-10-05 ENCOUNTER — Other Ambulatory Visit: Payer: Self-pay

## 2024-10-06 ENCOUNTER — Other Ambulatory Visit: Payer: Self-pay

## 2024-10-08 ENCOUNTER — Other Ambulatory Visit: Payer: Self-pay | Admitting: Internal Medicine

## 2024-10-08 ENCOUNTER — Other Ambulatory Visit: Payer: Self-pay

## 2024-10-08 ENCOUNTER — Ambulatory Visit: Admitting: Internal Medicine

## 2024-10-08 ENCOUNTER — Encounter: Payer: Self-pay | Admitting: Internal Medicine

## 2024-10-08 VITALS — BP 122/62 | HR 78 | Temp 97.8°F | Ht 62.0 in | Wt 99.0 lb

## 2024-10-08 DIAGNOSIS — Z87440 Personal history of urinary (tract) infections: Secondary | ICD-10-CM | POA: Diagnosis not present

## 2024-10-08 DIAGNOSIS — G4700 Insomnia, unspecified: Secondary | ICD-10-CM

## 2024-10-08 DIAGNOSIS — E782 Mixed hyperlipidemia: Secondary | ICD-10-CM | POA: Diagnosis not present

## 2024-10-08 DIAGNOSIS — F4321 Adjustment disorder with depressed mood: Secondary | ICD-10-CM

## 2024-10-08 DIAGNOSIS — Z1231 Encounter for screening mammogram for malignant neoplasm of breast: Secondary | ICD-10-CM

## 2024-10-08 DIAGNOSIS — N390 Urinary tract infection, site not specified: Secondary | ICD-10-CM

## 2024-10-08 DIAGNOSIS — Z862 Personal history of diseases of the blood and blood-forming organs and certain disorders involving the immune mechanism: Secondary | ICD-10-CM | POA: Diagnosis not present

## 2024-10-08 DIAGNOSIS — Z0000 Encounter for general adult medical examination without abnormal findings: Secondary | ICD-10-CM

## 2024-10-08 DIAGNOSIS — R634 Abnormal weight loss: Secondary | ICD-10-CM | POA: Diagnosis not present

## 2024-10-08 DIAGNOSIS — R5383 Other fatigue: Secondary | ICD-10-CM

## 2024-10-08 DIAGNOSIS — R319 Hematuria, unspecified: Secondary | ICD-10-CM | POA: Diagnosis not present

## 2024-10-08 DIAGNOSIS — R7301 Impaired fasting glucose: Secondary | ICD-10-CM | POA: Diagnosis not present

## 2024-10-08 DIAGNOSIS — D649 Anemia, unspecified: Secondary | ICD-10-CM

## 2024-10-08 LAB — CBC WITH DIFFERENTIAL/PLATELET
Basophils Absolute: 0.1 K/uL (ref 0.0–0.1)
Basophils Relative: 1 % (ref 0.0–3.0)
Eosinophils Absolute: 0.2 K/uL (ref 0.0–0.7)
Eosinophils Relative: 4.2 % (ref 0.0–5.0)
HCT: 40.1 % (ref 36.0–46.0)
Hemoglobin: 13.6 g/dL (ref 12.0–15.0)
Lymphocytes Relative: 23.4 % (ref 12.0–46.0)
Lymphs Abs: 1.2 K/uL (ref 0.7–4.0)
MCHC: 33.9 g/dL (ref 30.0–36.0)
MCV: 89.1 fl (ref 78.0–100.0)
Monocytes Absolute: 0.4 K/uL (ref 0.1–1.0)
Monocytes Relative: 8.5 % (ref 3.0–12.0)
Neutro Abs: 3.2 K/uL (ref 1.4–7.7)
Neutrophils Relative %: 62.9 % (ref 43.0–77.0)
Platelets: 245 K/uL (ref 150.0–400.0)
RBC: 4.5 Mil/uL (ref 3.87–5.11)
RDW: 13.3 % (ref 11.5–15.5)
WBC: 5 K/uL (ref 4.0–10.5)

## 2024-10-08 LAB — TSH: TSH: 1.41 u[IU]/mL (ref 0.35–5.50)

## 2024-10-08 LAB — LIPID PANEL
Cholesterol: 208 mg/dL — ABNORMAL HIGH (ref 28–200)
HDL: 76.9 mg/dL
LDL Cholesterol: 118 mg/dL — ABNORMAL HIGH (ref 10–99)
NonHDL: 131.1
Total CHOL/HDL Ratio: 3
Triglycerides: 67 mg/dL (ref 10.0–149.0)
VLDL: 13.4 mg/dL (ref 0.0–40.0)

## 2024-10-08 LAB — HEMOGLOBIN A1C: Hgb A1c MFr Bld: 5.6 % (ref 4.6–6.5)

## 2024-10-08 LAB — LDL CHOLESTEROL, DIRECT: Direct LDL: 116 mg/dL

## 2024-10-08 MED ORDER — ESCITALOPRAM OXALATE 5 MG PO TABS
5.0000 mg | ORAL_TABLET | Freq: Every day | ORAL | 1 refills | Status: AC
  Filled 2024-10-08: qty 90, 90d supply, fill #0

## 2024-10-08 MED ORDER — EZETIMIBE 10 MG PO TABS
10.0000 mg | ORAL_TABLET | Freq: Every day | ORAL | 5 refills | Status: AC
  Filled 2024-10-08: qty 30, 30d supply, fill #0

## 2024-10-08 NOTE — Progress Notes (Unsigned)
 Patient ID: Jean Brooks, female    DOB: 1946/02/12  Age: 79 y.o. MRN: [id number]  The patient is here for annual preventive examination and management of other chronic and acute problems.   The risk factors are reflected in the social history.   The roster of all physicians providing medical care to patient - is listed in the Snapshot section of the chart.   Activities of daily living:  The patient is 100% independent in all ADLs: dressing, toileting, feeding as well as independent mobility   Home safety : The patient has smoke detectors in the home. They wear seatbelts.  There are no unsecured firearms at home. There is no violence in the home.    There is no risks for hepatitis, STDs or HIV. There is no   history of blood transfusion. They have no travel history to infectious disease endemic areas of the world.   The patient has seen their dentist in the last six month. They have seen their eye doctor in the last year. The patinet  denies slight hearing difficulty with regard to whispered voices and some television programs.  They have deferred audiologic testing in the last year.  They do not  have excessive sun exposure. Discussed the need for sun protection: hats, long sleeves and use of sunscreen if there is significant sun exposure.    Diet: the importance of a healthy diet is discussed. They do have a healthy diet.   The benefits of regular aerobic exercise were discussed. The patient  exercises  3 to 5 days per week  for  60 minutes.    Depression screen: there are no signs or vegative symptoms of depression- irritability, change in appetite, anhedonia, sadness/tearfullness.   The following portions of the patient's history were reviewed and updated as appropriate: allergies, current medications, past family history, past medical history,  past surgical history, past social history  and problem list.   Visual acuity was not assessed per patient preference since the patient has  regular follow up with an  ophthalmologist. Hearing and body mass index were assessed and reviewed.    During the course of the visit the patient was educated and counseled about appropriate screening and preventive services including : fall prevention , diabetes screening, nutrition counseling, colorectal cancer screening, and recommended immunizations.    Chief Complaint:    1) treated for UTI with fosfomycin  by Urology. Workup underway for right hydronephrosis by urology  2) widowed recently; first christmas alone.  Weight loss of 6 lbs since May,  BMI now < 19  3) Son 's rectal CA chemo  treatment ended last week.    Review of Symptoms  Patient denies headache, fevers, malaise, unintentional weight loss, skin rash, eye pain, sinus congestion and sinus pain, sore throat, dysphagia,  hemoptysis , cough, dyspnea, wheezing, chest pain, palpitations, orthopnea, edema, abdominal pain, nausea, melena, diarrhea, constipation, flank pain, dysuria, hematuria, urinary  Frequency, nocturia, numbness, tingling, seizures,  Focal weakness, Loss of consciousness,  Tremor, insomnia, depression, anxiety, and suicidal ideation.    Physical Exam:  BP 122/62 (BP Location: Left Arm)   Pulse 78   Temp 97.8 F (36.6 C)   Ht 5' 2 (1.575 m)   Wt 99 lb (44.9 kg)   SpO2 98%   BMI 18.11 kg/m    Physical Exam Vitals reviewed.  Constitutional:      General: She is not in acute distress.    Appearance: Normal appearance. She is well-developed and normal  weight. She is not ill-appearing, toxic-appearing or diaphoretic.  HENT:     Head: Normocephalic.     Right Ear: Tympanic membrane, ear canal and external ear normal. There is no impacted cerumen.     Left Ear: Tympanic membrane, ear canal and external ear normal. There is no impacted cerumen.     Nose: Nose normal.     Mouth/Throat:     Mouth: Mucous membranes are moist.     Pharynx: Oropharynx is clear.  Eyes:     General: No scleral icterus.        Right eye: No discharge.        Left eye: No discharge.     Conjunctiva/sclera: Conjunctivae normal.     Pupils: Pupils are equal, round, and reactive to light.  Neck:     Thyroid : No thyromegaly.     Vascular: No carotid bruit or JVD.  Cardiovascular:     Rate and Rhythm: Normal rate and regular rhythm.     Heart sounds: Normal heart sounds.  Pulmonary:     Effort: Pulmonary effort is normal. No respiratory distress.     Breath sounds: Normal breath sounds.  Chest:  Breasts:    Breasts are symmetrical.     Right: Normal. No swelling, inverted nipple, mass, nipple discharge, skin change or tenderness.     Left: Normal. No swelling, inverted nipple, mass, nipple discharge, skin change or tenderness.  Abdominal:     General: Bowel sounds are normal.     Palpations: Abdomen is soft. There is no mass.     Tenderness: There is no abdominal tenderness. There is no guarding or rebound.  Musculoskeletal:        General: Normal range of motion.     Cervical back: Normal range of motion and neck supple.  Lymphadenopathy:     Cervical: No cervical adenopathy.     Upper Body:     Right upper body: No supraclavicular, axillary or pectoral adenopathy.     Left upper body: No supraclavicular, axillary or pectoral adenopathy.  Skin:    General: Skin is warm and dry.  Neurological:     General: No focal deficit present.     Mental Status: She is alert and oriented to person, place, and time. Mental status is at baseline.  Psychiatric:        Mood and Affect: Mood normal.        Behavior: Behavior normal.        Thought Content: Thought content normal.        Judgment: Judgment normal.    Assessment and Plan: Encounter for screening mammogram for malignant neoplasm of breast  Hyperlipidemia, mixed  Other fatigue  Impaired fasting glucose    No follow-ups on file.  Verneita LITTIE Kettering, MD

## 2024-10-08 NOTE — Patient Instructions (Addendum)
 Please start the Lexapro  (escitalopram ) at 1/2 tablet daily WITH FOOD  for the first few days to avoid nausea.  You can increase to a full tablet after 4 days if you have  not developed side effects of nausea.   Please return in  4 weeks ,  Or e mail me to let me know how it is helping your depression   [email or web address]  on youtube  has wonderful daily  devotionals I THE MORNING AND EVENING

## 2024-10-08 NOTE — Assessment & Plan Note (Signed)
 Ongoing,  aggravated by grief.  Wants to start lexapro 

## 2024-10-09 LAB — COMPREHENSIVE METABOLIC PANEL WITH GFR
AG Ratio: 1.8 (calc) (ref 1.0–2.5)
ALT: 15 U/L (ref 6–29)
AST: 19 U/L (ref 10–35)
Albumin: 4.4 g/dL (ref 3.6–5.1)
Alkaline phosphatase (APISO): 62 U/L (ref 37–153)
BUN: 14 mg/dL (ref 7–25)
CO2: 27 mmol/L (ref 20–32)
Calcium: 9.3 mg/dL (ref 8.6–10.4)
Chloride: 103 mmol/L (ref 98–110)
Creat: 0.68 mg/dL (ref 0.60–1.00)
Globulin: 2.4 g/dL (ref 1.9–3.7)
Glucose, Bld: 91 mg/dL (ref 65–99)
Potassium: 4.2 mmol/L (ref 3.5–5.3)
Sodium: 139 mmol/L (ref 135–146)
Total Bilirubin: 0.6 mg/dL (ref 0.2–1.2)
Total Protein: 6.8 g/dL (ref 6.1–8.1)
eGFR: 90 mL/min/1.73m2

## 2024-10-10 ENCOUNTER — Encounter: Payer: Self-pay | Admitting: Internal Medicine

## 2024-10-10 ENCOUNTER — Ambulatory Visit: Payer: Self-pay | Admitting: Internal Medicine

## 2024-10-10 DIAGNOSIS — F4321 Adjustment disorder with depressed mood: Secondary | ICD-10-CM | POA: Insufficient documentation

## 2024-10-10 NOTE — Assessment & Plan Note (Signed)
 She Has been dependent on ambien  cr for years   She did not tolerate previous trials of trazodone  due to excessive sedation . Advised to add trial of Relaxium or other  (natural supplement)  as complementary therapy to increase her sleep period from 4-5 hours to 6-8

## 2024-10-10 NOTE — Progress Notes (Signed)
 Patient ID: Jean Brooks, female    DOB: [birth year]  Age: 79 y.o. MRN: [id number]  The patient is here for annual preventive examination and management of other chronic and acute problems.   The risk factors are reflected in the social history.   The roster of all physicians providing medical care to patient - is listed in the Snapshot section of the chart.   Activities of daily living:  The patient is 100% independent in all ADLs: dressing, toileting, feeding as well as independent mobility   Home safety : The patient has smoke detectors in the home. They wear seatbelts.  There are no unsecured firearms at home. There is no violence in the home.    There is no risks for hepatitis, STDs or HIV. There is no   history of blood transfusion. They have no travel history to infectious disease endemic areas of the world.   The patient has seen their dentist in the last six month. They have seen their eye doctor in the last year. The patinet  denies slight hearing difficulty with regard to whispered voices and some television programs.  They have deferred audiologic testing in the last year.  They do not  have excessive sun exposure. Discussed the need for sun protection: hats, long sleeves and use of sunscreen if there is significant sun exposure.    Diet: the importance of a healthy diet is discussed. They do have a healthy diet.   The benefits of regular aerobic exercise were discussed. The patient  exercises  3 to 5 days per week  for  60 minutes.    Depression screen: there are no signs or vegative symptoms of depression- irritability, change in appetite, anhedonia, sadness/tearfullness.   The following portions of the patient's history were reviewed and updated as appropriate: allergies, current medications, past family history, past medical history,  past surgical history, past social history  and problem list.   Visual acuity was not assessed per patient preference since the patient has  regular follow up with an  ophthalmologist. Hearing and body mass index were assessed and reviewed.    During the course of the visit the patient was educated and counseled about appropriate screening and preventive services including : fall prevention , diabetes screening, nutrition counseling, colorectal cancer screening, and recommended immunizations.    Chief Complaint:      1)  Jean Brooks was recently  treated for UTI with fosfomycin  by Urology. Workup underway for right hydronephrosis by urology.  We reviewed the urine culture and her symptoms,  which have resolved    2  Grief:  she waswidowed recently; this was her first Christmans without her husband .  She spent the holiday with her son.  She has had a weight loss of 6 lbs since May,  BMI now < 19   3) Son was diagnosed with rectal CA  in 202 and completed chemotherapy  last week.   Review of Symptoms  Patient denies headache, fevers, malaise, unintentional weight loss, skin rash, eye pain, sinus congestion and sinus pain, sore throat, dysphagia,  hemoptysis , cough, dyspnea, wheezing, chest pain, palpitations, orthopnea, edema, abdominal pain, nausea, melena, diarrhea, constipation, flank pain, dysuria, hematuria, urinary  Frequency, nocturia, numbness, tingling, seizures,  Focal weakness, Loss of consciousness,  Tremor, insomnia, depression, anxiety, and suicidal ideation.    Physical Exam:  BP 122/62 (BP Location: Left Arm)   Pulse 78   Temp 97.8 F (36.6 C)   Ht 5' 2 (1.575  m)   Wt 99 lb (44.9 kg)   SpO2 98%   BMI 18.11 kg/m    Physical Exam Vitals reviewed.  Constitutional:      General: She is not in acute distress.    Appearance: She is underweight. She is not ill-appearing, toxic-appearing or diaphoretic.  HENT:     Head: Normocephalic.  Eyes:     General: No scleral icterus.       Right eye: No discharge.        Left eye: No discharge.     Conjunctiva/sclera: Conjunctivae normal.  Cardiovascular:     Rate and  Rhythm: Normal rate and regular rhythm.     Heart sounds: Normal heart sounds.  Pulmonary:     Effort: Pulmonary effort is normal. No respiratory distress.     Breath sounds: Normal breath sounds.  Musculoskeletal:        General: Normal range of motion.  Skin:    General: Skin is warm and dry.  Neurological:     General: No focal deficit present.     Mental Status: She is alert and oriented to person, place, and time. Mental status is at baseline.  Psychiatric:        Mood and Affect: Mood normal.        Speech: Speech normal.        Behavior: Behavior normal.        Thought Content: Thought content normal.        Judgment: Judgment normal.     Comments: Sad affect     Assessment and Plan: Encounter for screening mammogram for malignant neoplasm of breast  Hyperlipidemia, mixed -     Lipid panel -     LDL cholesterol, direct  Other fatigue -     TSH -     CBC with Differential/Platelet  Impaired fasting glucose -     Comprehensive metabolic panel with GFR -     Hemoglobin A1c  Weight loss Assessment & Plan: Ongoing,  aggravated by grief following the loss of her husband several months ago.  She declines restart of mirtazipine due to excessive sedating effects but is willing to try a mild SSRI.  Rx for lexapro  recommended. 5 mg starting dose    Recurrent UTI Assessment & Plan: Urology Workup underway for 4  documented UTIs since April 9 .  She has no RFs: not sexually active . Hydronephrosis of right kidney noted ;  etiology unclear.    Normocytic anemia Assessment & Plan: Resolved by repeat labs   Lab Results  Component Value Date   WBC 5.0 10/08/2024   HGB 13.6 10/08/2024   HCT 40.1 10/08/2024   MCV 89.1 10/08/2024   PLT 245.0 10/08/2024      Insomnia, persistent Assessment & Plan: She Has been dependent on ambien  cr for years   She did not tolerate previous trials of trazodone  due to excessive sedation . Advised to add trial of Relaxium or other   (natural supplement)  as complementary therapy to increase her sleep period from 4-5 hours to 6-8    Hematuria, unspecified type Assessment & Plan: Recurrent,  S/p remote hysterectomy,  recent hemicolectomy (Sept 2023). No histor y of nephrolithiasis.  Continue Urology workup with planned cystoscopy    Encounter for preventive health examination Assessment & Plan: age appropriate education and counseling updated, referrals for preventative services and immunizations addressed, dietary and smoking counseling addressed, most recent labs reviewed.  I have personally reviewed and have noted:  1) the patient's medical and social history 2) The pt's use of alcohol, tobacco, and illicit drugs 3) The patient's current medications and supplements 4) Functional ability including ADL's, fall risk, home safety risk, hearing and visual impairment 5) Diet and physical activities 6) Evidence for depression or mood disorder 7) The patient's height, weight, and BMI have been recorded in the chart    I have made referrals, and provided counseling and education based on review of the above    Grief Assessment & Plan: Prolonged, secondary to husband's unexpected death.  Trial of lexapro  given weight loss.     Other orders -     Escitalopram  Oxalate; Take 1 tablet (5 mg total) by mouth daily.  Dispense: 90 tablet; Refill: 1    Return in about 4 weeks (around 11/05/2024) for DEPRESSION.  Verneita LITTIE Kettering, MD

## 2024-10-10 NOTE — Assessment & Plan Note (Signed)
 Recurrent,  S/p remote hysterectomy,  recent hemicolectomy (Sept 2023). No histor y of nephrolithiasis.  Continue Urology workup with planned cystoscopy

## 2024-10-10 NOTE — Assessment & Plan Note (Signed)
 Resolved by repeat labs   Lab Results  Component Value Date   WBC 5.0 10/08/2024   HGB 13.6 10/08/2024   HCT 40.1 10/08/2024   MCV 89.1 10/08/2024   PLT 245.0 10/08/2024

## 2024-10-10 NOTE — Assessment & Plan Note (Signed)
 Prolonged, secondary to husband's unexpected death.  Trial of lexapro  given weight loss.

## 2024-10-10 NOTE — Assessment & Plan Note (Signed)
 Urology Workup underway for 4  documented UTIs since April 9 .  She has no RFs: not sexually active . Hydronephrosis of right kidney noted ;  etiology unclear.

## 2024-10-10 NOTE — Assessment & Plan Note (Signed)

## 2024-10-12 ENCOUNTER — Ambulatory Visit: Payer: Self-pay

## 2024-10-12 NOTE — Telephone Encounter (Signed)
 Copied from CRM [id number]. Topic: Clinical - Medical Advice >> Oct 12, 2024  8:15 AM Mesmerise C wrote: Reason for CRM: patient stated she took escitalopram  (LEXAPRO ) 5 MG tablet stated on Saturday when she took it with food felt nauseous in the afternoon and on sunday afternoon took it again with food felt sick and felt like she had vertigo hasn't taken it today or yesterday wanted to make provider aware and what to do can be reached at her work phone at [phone] but not leave a detailed message Reason for Disposition  Taking a medicine that could cause dizziness (e.g., blood pressure medications, diuretics)  Answer Assessment - Initial Assessment Questions This RN recommended pt be examined in next 24 hours, pt refusing disposition, requesting feedback from Dr. Marylynn about symptoms with lexapro , pt is willing to try lexapro  again if Dr. Marylynn advises. Pt requesting call back to work phone [phone] but do not leave detailed message - per pt request, say Sueanne Mayers call (PCP office number). Advised pt call back or seek immediate care if any new or worsening symptoms. Sending message to PCP office for call back to pt with further recommendations.

## 2024-10-12 NOTE — Telephone Encounter (Signed)
 Spoke with pt and informed her of Dr. Tullo's message. Pt gave a verbal understanding and stated that she will let us  know how she does with the 1/2 tablet.

## 2024-10-12 NOTE — Telephone Encounter (Signed)
 FYI Only or Action Required?: Action required by provider: clinical question for provider and update on patient condition.  Patient was last seen in primary care on 10/08/2024 by Marylynn Verneita CROME, MD.  Called Nurse Triage reporting Nausea and Medication Reaction.  Symptoms began several days ago.  Interventions attempted: Prescription medications: lexapro .  Symptoms are: rapidly improving since stopping.  Triage Disposition: See Physician Within 24 Hours  Patient/caregiver understands and will follow disposition?: No, wishes to speak with PCP     Copied from CRM #[id number]. Topic: Clinical - Medical Advice >> Oct 12, 2024  8:15 AM Mesmerise C wrote: Reason for CRM: patient stated she took escitalopram  (LEXAPRO ) 5 MG tablet stated on Saturday when she took it with food felt nauseous in the afternoon and on sunday afternoon took it again with food felt sick and felt like she had vertigo hasn't taken it today or yesterday wanted to make provider aware and what to do can be reached at her work phone at [phone] but not leave a detailed message Reason for Disposition  Taking a medicine that could cause dizziness (e.g., blood pressure medications, diuretics)  Answer Assessment - Initial Assessment Questions This RN recommended pt be examined in next 24 hours, pt refusing disposition, requesting feedback from Dr. Marylynn about symptoms with lexapro , pt is willing to try lexapro  again if Dr. Marylynn advises. Pt requesting call back to work phone [phone] but do not leave detailed message - per pt request, say Sueanne Mayers call (PCP office number). Advised pt call back or seek immediate care if any new or worsening symptoms. Sending message to PCP office for call back to pt with further recommendations.     Pt took lexapro  with full breakfast each time. No lexapro  yesterday or today, no symptoms  Symptoms gone since stopping Lexapro : Nausea was up against the wall...wasn't falling but  probably would have if wasn't against the wall...was afraid to move with the nausea Dizziness  Denies: SOB Chest pain Other symptoms  Protocols used: Dizziness - Lightheadedness-A-AH

## 2024-10-14 ENCOUNTER — Ambulatory Visit

## 2024-10-16 ENCOUNTER — Other Ambulatory Visit: Payer: Self-pay

## 2024-10-16 ENCOUNTER — Other Ambulatory Visit: Payer: Self-pay | Admitting: Internal Medicine

## 2024-10-17 ENCOUNTER — Other Ambulatory Visit: Payer: Self-pay

## 2024-10-19 ENCOUNTER — Other Ambulatory Visit: Payer: Self-pay

## 2024-10-19 MED FILL — Ondansetron Orally Disintegrating Tab 4 MG: ORAL | 7 days supply | Qty: 20 | Fill #0 | Status: AC

## 2024-10-21 ENCOUNTER — Ambulatory Visit
Admission: RE | Admit: 2024-10-21 | Discharge: 2024-10-21 | Disposition: A | Discharge status: Home / Self Care | Source: Ambulatory Visit | Attending: Physician Assistant | Admitting: Physician Assistant

## 2024-10-21 DIAGNOSIS — N1330 Unspecified hydronephrosis: Secondary | ICD-10-CM | POA: Diagnosis present

## 2024-10-21 MED ORDER — IOHEXOL 300 MG/ML  SOLN
85.0000 mL | Freq: Once | INTRAMUSCULAR | Status: AC | PRN
  Administered 2024-10-21: 85 mL via INTRAVENOUS

## 2024-10-21 MED ORDER — SODIUM CHLORIDE 0.9 % IV SOLN: INTRAVENOUS | Status: DC

## 2024-10-22 ENCOUNTER — Other Ambulatory Visit: Payer: Self-pay

## 2024-10-25 ENCOUNTER — Other Ambulatory Visit: Payer: Self-pay

## 2024-10-26 ENCOUNTER — Other Ambulatory Visit: Payer: Self-pay

## 2024-10-26 ENCOUNTER — Ambulatory Visit: Payer: Self-pay | Admitting: Physician Assistant

## 2024-10-27 NOTE — Telephone Encounter (Signed)
 She came into the office and for some reason it will not let her reply to the [place] message. She says thank you and call her if you need too.

## 2024-11-02 ENCOUNTER — Other Ambulatory Visit: Payer: Self-pay | Admitting: Internal Medicine

## 2024-11-02 DIAGNOSIS — Z9049 Acquired absence of other specified parts of digestive tract: Secondary | ICD-10-CM

## 2024-11-03 ENCOUNTER — Other Ambulatory Visit: Payer: Self-pay

## 2024-11-03 ENCOUNTER — Other Ambulatory Visit: Payer: Self-pay | Admitting: Internal Medicine

## 2024-11-03 DIAGNOSIS — Z9049 Acquired absence of other specified parts of digestive tract: Secondary | ICD-10-CM

## 2024-11-04 ENCOUNTER — Other Ambulatory Visit: Payer: Self-pay

## 2024-11-04 MED FILL — Zolpidem Tartrate Tab ER 12.5 MG: ORAL | 30 days supply | Qty: 30 | Fill #0 | Status: AC

## 2024-11-05 ENCOUNTER — Other Ambulatory Visit: Payer: Self-pay

## 2024-11-05 ENCOUNTER — Ambulatory Visit: Admitting: Internal Medicine

## 2024-12-06 ENCOUNTER — Ambulatory Visit: Admitting: Internal Medicine
